# Patient Record
Sex: Male | Born: 1945
Health system: Southern US, Community
[De-identification: ages and names within clinical notes are randomized; demographics above are authoritative.]

## PROBLEM LIST (undated history)

## (undated) ENCOUNTER — Emergency Department (HOSPITAL_COMMUNITY): Discharge status: Against Medical Advice | Payer: Self-pay

## (undated) DIAGNOSIS — I4891 Unspecified atrial fibrillation: Secondary | ICD-10-CM

## (undated) DIAGNOSIS — IMO0001 Reserved for inherently not codable concepts without codable children: Secondary | ICD-10-CM

## (undated) DIAGNOSIS — I4892 Unspecified atrial flutter: Secondary | ICD-10-CM

## (undated) DIAGNOSIS — I493 Ventricular premature depolarization: Secondary | ICD-10-CM

## (undated) DIAGNOSIS — Z8719 Personal history of other diseases of the digestive system: Secondary | ICD-10-CM

## (undated) DIAGNOSIS — R030 Elevated blood-pressure reading, without diagnosis of hypertension: Secondary | ICD-10-CM

## (undated) DIAGNOSIS — T7840XA Allergy, unspecified, initial encounter: Secondary | ICD-10-CM

## (undated) DIAGNOSIS — R42 Dizziness and giddiness: Secondary | ICD-10-CM

## (undated) HISTORY — DX: Ventricular premature depolarization: I49.3

## (undated) HISTORY — DX: Allergy, unspecified, initial encounter: T78.40XA

## (undated) HISTORY — PX: OTHER SURGICAL HISTORY: SHX169

## (undated) HISTORY — DX: Reserved for inherently not codable concepts without codable children: IMO0001

## (undated) HISTORY — DX: Unspecified atrial fibrillation: I48.91

## (undated) HISTORY — DX: Personal history of other diseases of the digestive system: Z87.19

## (undated) HISTORY — DX: Dizziness and giddiness: R42

## (undated) HISTORY — DX: Elevated blood-pressure reading, without diagnosis of hypertension: R03.0

## (undated) HISTORY — DX: Unspecified atrial flutter: I48.92

---

## 2008-06-19 DIAGNOSIS — Z8719 Personal history of other diseases of the digestive system: Secondary | ICD-10-CM

## 2008-06-19 HISTORY — DX: Personal history of other diseases of the digestive system: Z87.19

## 2009-04-28 ENCOUNTER — Inpatient Hospital Stay (HOSPITAL_COMMUNITY): Admission: EM | Admit: 2009-04-28 | Discharge: 2009-04-28 | Payer: Self-pay | Admitting: Emergency Medicine

## 2009-12-13 ENCOUNTER — Encounter: Admission: RE | Admit: 2009-12-13 | Discharge: 2009-12-13 | Payer: Self-pay | Admitting: Gastroenterology

## 2010-09-21 LAB — CBC
HCT: 42.2 % (ref 39.0–52.0)
Hemoglobin: 13.1 g/dL (ref 13.0–17.0)
Hemoglobin: 14.8 g/dL (ref 13.0–17.0)
MCHC: 35.3 g/dL (ref 30.0–36.0)
MCV: 93.1 fL (ref 78.0–100.0)
Platelets: 134 10*3/uL — ABNORMAL LOW (ref 150–400)
RBC: 3.95 MIL/uL — ABNORMAL LOW (ref 4.22–5.81)
WBC: 6.7 10*3/uL (ref 4.0–10.5)

## 2010-09-21 LAB — COMPREHENSIVE METABOLIC PANEL
ALT: 17 U/L (ref 0–53)
AST: 15 U/L (ref 0–37)
Albumin: 3.6 g/dL (ref 3.5–5.2)
CO2: 25 mEq/L (ref 19–32)
Calcium: 8.8 mg/dL (ref 8.4–10.5)
Potassium: 3.7 mEq/L (ref 3.5–5.1)
Total Protein: 6.9 g/dL (ref 6.0–8.3)

## 2010-09-21 LAB — URINALYSIS, ROUTINE W REFLEX MICROSCOPIC
Glucose, UA: NEGATIVE mg/dL
Leukocytes, UA: NEGATIVE
Nitrite: NEGATIVE
Protein, ur: 30 mg/dL — AB
Urobilinogen, UA: 0.2 mg/dL (ref 0.0–1.0)
pH: 5.5 (ref 5.0–8.0)

## 2010-09-21 LAB — URINE MICROSCOPIC-ADD ON

## 2010-09-21 LAB — DIFFERENTIAL
Basophils Relative: 0 % (ref 0–1)
Eosinophils Absolute: 0 10*3/uL (ref 0.0–0.7)
Eosinophils Relative: 0 % (ref 0–5)
Monocytes Absolute: 0.4 10*3/uL (ref 0.1–1.0)
Monocytes Relative: 5 % (ref 3–12)
Neutro Abs: 7.1 10*3/uL (ref 1.7–7.7)

## 2010-09-21 LAB — POCT I-STAT, CHEM 8
Calcium, Ion: 1.16 mmol/L (ref 1.12–1.32)
Sodium: 138 mEq/L (ref 135–145)

## 2010-09-21 LAB — BASIC METABOLIC PANEL
BUN: 5 mg/dL — ABNORMAL LOW (ref 6–23)
Calcium: 8.5 mg/dL (ref 8.4–10.5)
Chloride: 105 mEq/L (ref 96–112)
Creatinine, Ser: 1.28 mg/dL (ref 0.4–1.5)
Glucose, Bld: 108 mg/dL — ABNORMAL HIGH (ref 70–99)
Potassium: 3.7 mEq/L (ref 3.5–5.1)

## 2010-09-21 LAB — LIPASE, BLOOD: Lipase: 14 U/L (ref 11–59)

## 2012-10-29 ENCOUNTER — Encounter: Payer: Self-pay | Admitting: Family Medicine

## 2012-10-29 ENCOUNTER — Ambulatory Visit (INDEPENDENT_AMBULATORY_CARE_PROVIDER_SITE_OTHER): Payer: 59 | Admitting: Family Medicine

## 2012-10-29 VITALS — BP 138/80 | HR 74 | Temp 97.8°F | Resp 18 | Ht 70.0 in | Wt 166.0 lb

## 2012-10-29 DIAGNOSIS — Z0000 Encounter for general adult medical examination without abnormal findings: Secondary | ICD-10-CM

## 2012-10-29 DIAGNOSIS — T7840XA Allergy, unspecified, initial encounter: Secondary | ICD-10-CM | POA: Insufficient documentation

## 2012-10-29 MED ORDER — TRIAMCINOLONE ACETONIDE 0.025 % EX OINT: TOPICAL_OINTMENT | Freq: Two times a day (BID) | CUTANEOUS | Status: DC

## 2012-10-29 MED ORDER — FLUTICASONE PROPIONATE 50 MCG/ACT NA SUSP: 2.0000 | Freq: Every day | NASAL | Status: DC

## 2012-10-29 MED ORDER — LEVOCETIRIZINE DIHYDROCHLORIDE 5 MG PO TABS: 5.0000 mg | ORAL_TABLET | Freq: Every evening | ORAL | Status: DC

## 2012-10-29 NOTE — Progress Notes (Signed)
Subjective:    Patient ID: Gabriel Everett, male    DOB: Sep 04, 1945, 67 y.o.   MRN: [id number]  HPI  Patient is a 67 year old pleasant man here to establish care. He has no specific medical concerns. He would like a primary care physician. His past medical history is benign. He has mild seasonal allergies. He takes when necessary medication for that.  He was admitted in 2010 for right-sided colonic diverticulitis. He underwent a colonoscopy by Dr. Madilyn Fireman which was normal in 2011. He had a CT of abdomen and pelvis in 2011 that showed resolution of the diverticulitis.  Otherwise he seldom sees a doctor. He denies any significant past medical history or PSH.  Past Medical History  Diagnosis Date  . Allergy    Current outpatient prescriptions:triamcinolone (KENALOG) 0.025 % ointment, Apply topically 2 (two) times daily., Disp: 30 g, Rfl: 1;  fluticasone (FLONASE) 50 MCG/ACT nasal spray, Place 2 sprays into the nose daily., Disp: 16 g, Rfl: 6;  levocetirizine (XYZAL) 5 MG tablet, Take 1 tablet (5 mg total) by mouth every evening., Disp: 30 tablet, Rfl: 5  He has no known drug allergies History   Social History  . Marital Status: Married    Spouse Name: N/A    Number of Children: N/A  . Years of Education: N/A   Occupational History  . Not on file.   Social History Main Topics  . Smoking status: Never Smoker   . Smokeless tobacco: Never Used  . Alcohol Use: No  . Drug Use: No  . Sexually Active: Not on file     Comment: married   Other Topics Concern  . Not on file   Social History Narrative  . No narrative on file   Family History  Problem Relation Age of Onset  . Cancer Mother 38    esophageal     Review of Systems  All other systems reviewed and are negative.       Objective:   Physical Exam  Vitals reviewed. Constitutional: He is oriented to person, place, and time. He appears well-developed and well-nourished.  HENT:  Head: Normocephalic and atraumatic.   Right Ear: External ear normal.  Left Ear: External ear normal.  Nose: Nose normal.  Mouth/Throat: Oropharynx is clear and moist. No oropharyngeal exudate.  Eyes: Conjunctivae and EOM are normal. Pupils are equal, round, and reactive to light. Right eye exhibits no discharge. Left eye exhibits no discharge. No scleral icterus.  Neck: Normal range of motion. Neck supple. No JVD present. No tracheal deviation present. No thyromegaly present.  Cardiovascular: Normal rate, regular rhythm and normal heart sounds.  Exam reveals no gallop and no friction rub.   No murmur heard. Pulmonary/Chest: Effort normal and breath sounds normal. No respiratory distress. He has no wheezes. He has no rales. He exhibits no tenderness.  Abdominal: Soft. Bowel sounds are normal. He exhibits no distension and no mass. There is no tenderness. There is no rebound and no guarding.  Genitourinary: Rectum normal and prostate normal.  Musculoskeletal: Normal range of motion. He exhibits no edema and no tenderness.  Lymphadenopathy:    He has no cervical adenopathy.  Neurological: He is alert and oriented to person, place, and time. He has normal reflexes. He displays normal reflexes. No cranial nerve deficit. He exhibits normal muscle tone. Coordination normal.  Skin: Skin is warm and dry. No rash noted. No erythema. No pallor.  Psychiatric: He has a normal mood and affect. His behavior is normal. Judgment  and thought content normal.          Assessment & Plan:  1. Routine general medical examination at a health care facility Patient has a normal physical exam today. I refilled his when necessary allergy medicines. I recommended he get a Pneumovax, Zostavax, and Tdap.  He deferred the shots for now. I also recommended that he obtain fasting blood work including a CBC, CMP, TSH, PSA, and fasting lipid.  He states his health insurance is coming tomorrow to draw blood work and get EKG. He would rather get that lab work and  EKG and fax that to me for me to review. I feel that this is appropriate. There is no need to be redundant with lab work. I will review the labs he has done tomorrow and his EKG. Any labs that are deficient, I can certainly draw them.  His colonoscopy is up-to-date.

## 2012-11-19 ENCOUNTER — Encounter: Payer: Self-pay | Admitting: Family Medicine

## 2013-05-22 ENCOUNTER — Encounter: Payer: Self-pay | Admitting: Physician Assistant

## 2013-05-22 ENCOUNTER — Ambulatory Visit (INDEPENDENT_AMBULATORY_CARE_PROVIDER_SITE_OTHER): Payer: Medicare Other | Admitting: Physician Assistant

## 2013-05-22 VITALS — BP 162/90 | HR 135 | Ht 70.0 in | Wt 176.0 lb

## 2013-05-22 DIAGNOSIS — I4891 Unspecified atrial fibrillation: Secondary | ICD-10-CM

## 2013-05-22 DIAGNOSIS — I4892 Unspecified atrial flutter: Secondary | ICD-10-CM | POA: Insufficient documentation

## 2013-05-22 MED ORDER — DILTIAZEM HCL ER COATED BEADS 120 MG PO CP24: 120.0000 mg | ORAL_CAPSULE | Freq: Every day | ORAL | Status: DC

## 2013-05-22 NOTE — Progress Notes (Signed)
Date:  05/22/2013   ID:  Gabriel Everett, DOB Oct 01, 1945, MRN [id number]  PCP:  Leo Grosser, MD  Primary Cardiologist:  [facility]     History of Present Illness: Gabriel Everett is a 67 y.o. male, who is very active, with a history of allergies and no prior cardiac history.  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  He has never used tobacco but does have 3-4 beers per day.  No family history of CAD/arrhythmia.  He had two [employer] physicals last May 2014 which were excellent.  His wife has colon cancer with what sounds like metastasis to the liver. He reports flu-like feeling this past Saturday.  He also has been having some dizziness with head movement and popping in his left ear.  He was seen by Dr. Wille Celeste today for the dizziness issue and EKG revealed a-flutter with rapid VR.  Here in the clinic he is in rapid A fib at 135bpm.  He is essentially asymptomatic and denies nausea, vomiting, fever, chest pain, shortness of breath, orthopnea, PND, cough, congestion, abdominal pain, hematochezia, melena, lower extremity edema, claudication.  Recent CMP, Lipid, UA, PSA, CEA were WNL.  Wt Readings from Last 3 Encounters:  05/22/13 176 lb (79.833 kg)  10/29/12 166 lb (75.297 kg)     Past Medical History  Diagnosis Date  . Allergy     Current Outpatient Prescriptions  Medication Sig Dispense Refill  . fluticasone (FLONASE) 50 MCG/ACT nasal spray Place 2 sprays into the nose daily.  16 g  6  . levocetirizine (XYZAL) 5 MG tablet Take 1 tablet (5 mg total) by mouth every evening.  30 tablet  5  . diltiazem (CARDIZEM CD) 120 MG 24 hr capsule Take 1 capsule (120 mg total) by mouth daily.  90 capsule  3   No current facility-administered medications for this visit.    Allergies:   No Known Allergies  Social History:  The patient  reports that he has never smoked. He has never used  smokeless tobacco. He reports that he drinks about 10.5 ounces of alcohol per week. He reports that he does not use illicit drugs.   Family history:   Family History  Problem Relation Age of Onset  . Cancer Mother 13    esophageal    ROS:  Please see the history of present illness.  All other systems reviewed and negative.   PHYSICAL EXAM: VS:  BP 162/90  Pulse 135  Ht 5\' 10"  (1.778 m)  Wt 176 lb (79.833 kg)  BMI 25.25 kg/m2 Well nourished, well developed, in no acute distress HEENT: Pupils are equal round react to light accommodation extraocular movements are intact.  Oral mucosa moist no exudate or lesions. Neck: no JVDNo cervical lymphadenopathy. Cardiac: irreg rate and rhythm. No MRG Lungs:  clear to auscultation bilaterally, no wheezing, rhonchi or rales Abd: soft, nontender, positive bowel sounds all quadrants, no hepatosplenomegaly Ext: no lower extremity edema.  2+ radial and dorsalis pedis pulses. Skin: warm and dry Neuro:  Grossly normal  EKG:  Afib 135bpm,  LAFB  ASSESSMENT AND PLAN:  Problem List Items Addressed This Visit   Atrial fibrillation with rapid ventricular response     New onset Afib/flutter.  The date of onset is unknown.  CHADSVASc of 1.  Start ASA 325mg .  I added 120mg  of Cardizem CD for rate  control.  Cardionet monitor for 2 weeks.  Will check 2D echo.  He may need ischemic WU.   May need TEE/ DCCV. In the future if it persists, but he is asymptomatic currently.  I did ask him to avoid caffeine and decrease ETOH intake.      Relevant Medications      diltiazem (CARDIZEM CD) 24 hr capsule    Other Visit Diagnoses   A-fib    -  Primary    Relevant Orders       EKG 12-Lead       2D Echocardiogram without contrast       Cardiac event monitor

## 2013-05-22 NOTE — Assessment & Plan Note (Addendum)
New onset Afib/flutter.  The date of onset is unknown.  CHADSVASc of 1.  Start ASA 325mg .  I added 120mg  of Cardizem CD for rate control.  Cardionet monitor for 2 weeks.  Will check 2D echo.  He may need ischemic WU.   May need TEE/ DCCV. In the future if it persists, but he is asymptomatic currently.  I did ask him to avoid caffeine and decrease ETOH intake.

## 2013-05-22 NOTE — Patient Instructions (Signed)
1.  Start taking a full dose ASA 325mg  daily. 2.  Cardionet monitor  3.  We will schedule a Echocardiogram. 4.  Follow up with Dr. Rennis Golden in two weeks.

## 2013-05-23 ENCOUNTER — Encounter: Payer: Self-pay | Admitting: Physician Assistant

## 2013-05-23 ENCOUNTER — Telehealth: Payer: Self-pay | Admitting: Physician Assistant

## 2013-05-23 NOTE — Telephone Encounter (Signed)
He wants to know how long he is suppose to wear his monitor?

## 2013-05-23 NOTE — Telephone Encounter (Signed)
Spoke to patient. Per office note 05/22/13 ,patient is aware to wear the monitor for 2 weeks. He verbalized understanding. It will be completed on  06/05/13.

## 2013-06-09 ENCOUNTER — Ambulatory Visit (HOSPITAL_COMMUNITY)
Admission: RE | Admit: 2013-06-09 | Discharge: 2013-06-09 | Disposition: A | Discharge status: Home / Self Care | Payer: Medicare Other | Source: Ambulatory Visit | Attending: Cardiology | Admitting: Cardiology

## 2013-06-09 ENCOUNTER — Other Ambulatory Visit (HOSPITAL_COMMUNITY): Payer: Self-pay | Admitting: Cardiovascular Disease

## 2013-06-09 DIAGNOSIS — R42 Dizziness and giddiness: Secondary | ICD-10-CM | POA: Insufficient documentation

## 2013-06-09 DIAGNOSIS — I4892 Unspecified atrial flutter: Secondary | ICD-10-CM

## 2013-06-09 NOTE — Progress Notes (Signed)
2D Echo Performed 06/09/2013    Matyas Baisley, RCS  

## 2013-06-16 ENCOUNTER — Encounter: Payer: Self-pay | Admitting: *Deleted

## 2013-06-18 ENCOUNTER — Encounter: Payer: Self-pay | Admitting: Internal Medicine

## 2013-06-18 ENCOUNTER — Ambulatory Visit (INDEPENDENT_AMBULATORY_CARE_PROVIDER_SITE_OTHER): Payer: Medicare Other | Admitting: Internal Medicine

## 2013-06-18 VITALS — BP 158/70 | HR 61 | Ht 70.0 in | Wt 174.7 lb

## 2013-06-18 DIAGNOSIS — I4892 Unspecified atrial flutter: Secondary | ICD-10-CM

## 2013-06-18 DIAGNOSIS — R42 Dizziness and giddiness: Secondary | ICD-10-CM

## 2013-06-18 DIAGNOSIS — I4891 Unspecified atrial fibrillation: Secondary | ICD-10-CM

## 2013-06-18 HISTORY — DX: Unspecified atrial flutter: I48.92

## 2013-06-18 HISTORY — DX: Dizziness and giddiness: R42

## 2013-06-18 NOTE — Progress Notes (Signed)
Date:  06/18/2013   ID:  Gabriel Everett, DOB July 01, 1945, MRN [id number]  PCP:  Leo Grosser, MD  Primary Cardiologist:  [facility]     History of Present Illness: Gabriel Everett is a 67 y.o. male, who is very active, with a history of allergies and no prior cardiac history.  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  He has never used tobacco but does have 3-4 beers per day.  No family history of CAD/arrhythmia.  He had two [employer] physicals last May 2014 which were excellent.  His wife has colon cancer with what sounds like metastasis to the liver. He reports flu-like feeling this past Saturday.  He also has been having some dizziness with head movement and popping in his left ear.  He was seen by Dr. Wille Celeste today for the dizziness issue and EKG revealed a-flutter with rapid VR.  In the office, he was noted to be in rapid A fib at 135bpm.  He is essentially asymptomatic and denies nausea, vomiting, fever, chest pain, shortness of breath, orthopnea, PND, cough, congestion, abdominal pain, hematochezia, melena, lower extremity edema, claudication.  He was placed on diltiazem 120 mg daily by Wilburt Finlay, PA-C, and started on full dose aspirin.  He underwent a monitor which she wore between 05/22/2013 and 06/04/2013. This showed typical counterclockwise atrial flutter which persisted until 05/26/2013. He was then noted to to have some PVCs and ultimately converted to sinus rhythm with first degree AV block. He subsequently had bigeminal PVCs on 05/29/2013 but is maintaining sinus rhythm by EKG today.  He is reportedly asymptomatic throughout this whole event.  He recently underwent an echocardiogram which was essentially normal, with no significant valvular abnormalities and normal left and right atrial chamber sizes.  Wt Readings from Last 3 Encounters:  06/18/13 174 lb 11.2 oz (79.243 kg)    05/22/13 176 lb (79.833 kg)  10/29/12 166 lb (75.297 kg)     Past Medical History  Diagnosis Date  . Allergy     Current Outpatient Prescriptions  Medication Sig Dispense Refill  . aspirin 325 MG tablet Take 325 mg by mouth daily.      Marland Kitchen diltiazem (CARDIZEM CD) 120 MG 24 hr capsule Take 1 capsule (120 mg total) by mouth daily.  90 capsule  3  . fluticasone (FLONASE) 50 MCG/ACT nasal spray Place 2 sprays into the nose daily as needed.      Marland Kitchen levocetirizine (XYZAL) 5 MG tablet Take 1 tablet (5 mg total) by mouth every evening.  30 tablet  5   No current facility-administered medications for this visit.    Allergies:   No Known Allergies  Social History:  The patient  reports that he has never smoked. He has never used smokeless tobacco. He reports that he drinks about 10.5 ounces of alcohol per week. He reports that he does not use illicit drugs.   Family history:   Family History  Problem Relation Age of Onset  . Cancer Mother 5    esophageal    ROS:  No significant symptoms at this time. All other systems reviewed and negative.   PHYSICAL EXAM: VS:  BP 158/70  Pulse 61  Ht 5\' 10"  (1.778 m)  Wt 174 lb 11.2 oz (79.243 kg)  BMI 25.07 kg/m2 deferred  EKG:  Normal sinus rhythm at 61, nonspecific IVCD  ASSESSMENT AND PLAN:  Problem List Items Addressed This Visit   Atrial fibrillation with rapid ventricular response - Primary   Relevant Medications      aspirin 325 MG tablet   Other Relevant Orders      EKG 12-Lead      Exercise Tolerance Test      Ambulatory referral to Cardiac Electrophysiology    Other Visit Diagnoses   Atrial flutter        Relevant Orders       Ambulatory referral to Cardiac Electrophysiology      PLAN: 1.  [name] had the onset of typical atrial flutter which appears isthmus-dependent. He was placed on Cardizem and aspirin and spontaneously converted while wearing a monitor. He is currently maintaining sinus rhythm. Is not clear to  me what triggered this event, but may be related to his upper respiratory infection. His echocardiogram demonstrates essentially normal cardiac structure and function. He has few risk factors for ischemia, however I would like to rule out any significant coronary ischemia and evaluate as to whether his symptoms could be exercise-induced.  I would recommend an exercise treadmill stress test.  If this is negative, I will refer him to see Dr. Johney Frame with cardiac electrophysiology for his advice as to whether or not [name] would benefit from flutter ablation.  Chrystie Nose, MD, [facility] Attending Cardiologist [facility]

## 2013-06-18 NOTE — Patient Instructions (Signed)
Your physician has requested that you have an exercise tolerance test. For further information please visit [email or web address] Please also follow instruction sheet, as given.  A referral has been made for a consultation with Dr. Hillis Range for an electrophysiology consult.  Your physician recommends that you schedule a follow-up appointment in: 6 months

## 2013-06-25 ENCOUNTER — Ambulatory Visit (HOSPITAL_COMMUNITY)
Admission: RE | Admit: 2013-06-25 | Discharge: 2013-06-25 | Disposition: A | Discharge status: Home / Self Care | Payer: Medicare HMO | Source: Ambulatory Visit | Attending: Internal Medicine | Admitting: Internal Medicine

## 2013-06-25 DIAGNOSIS — I4891 Unspecified atrial fibrillation: Secondary | ICD-10-CM

## 2013-07-02 ENCOUNTER — Telehealth: Payer: Self-pay | Admitting: Internal Medicine

## 2013-07-02 NOTE — Telephone Encounter (Signed)
Returning your call. °

## 2013-07-03 NOTE — Telephone Encounter (Signed)
Called patient with low risk treadmill stress test results.

## 2013-07-14 ENCOUNTER — Encounter: Payer: Self-pay | Admitting: Internal Medicine

## 2013-07-14 ENCOUNTER — Ambulatory Visit (INDEPENDENT_AMBULATORY_CARE_PROVIDER_SITE_OTHER): Payer: Medicare HMO | Admitting: Internal Medicine

## 2013-07-14 VITALS — BP 160/92 | HR 62 | Ht 70.0 in | Wt 175.0 lb

## 2013-07-14 DIAGNOSIS — I4891 Unspecified atrial fibrillation: Secondary | ICD-10-CM

## 2013-07-14 DIAGNOSIS — I10 Essential (primary) hypertension: Secondary | ICD-10-CM

## 2013-07-14 LAB — BASIC METABOLIC PANEL
BUN: 10 mg/dL (ref 6–23)
CHLORIDE: 107 meq/L (ref 96–112)
CO2: 26 mEq/L (ref 19–32)
CREATININE: 1.1 mg/dL (ref 0.4–1.5)
Calcium: 8.9 mg/dL (ref 8.4–10.5)
GFR: 73.47 mL/min (ref >60.00)
GLUCOSE: 85 mg/dL (ref 70–99)
POTASSIUM: 4 meq/L (ref 3.5–5.1)
Sodium: 139 mEq/L (ref 135–145)

## 2013-07-14 MED ORDER — LISINOPRIL 5 MG PO TABS: 5.0000 mg | ORAL_TABLET | Freq: Every day | ORAL | Status: DC

## 2013-07-14 NOTE — Patient Instructions (Signed)
Your physician recommends that you schedule a follow-up appointment as needed  Your physician has recommended you make the following change in your medication:  1) Start Lisinopril 5 mg daily  Your physician recommends that you return for lab work today and in 6 weeks BMP at your PCP office with a BP check

## 2013-07-14 NOTE — Progress Notes (Signed)
Primary Care Physician: Leo Grosser, MD Referring Physician:  Dr Verl Blalock is a 68 y.o. male with a h/o recently diagnosed atrial flutter who presents today for EP consultation.  He reports being in good health until December when he developed dizziness related to "inner ear infection".  He was evaluated at urgent care and was found to have atrial flutter.  He was referred to Dr Rennis Golden and was confirmed to have atrial flutter.  He wore an event monitor during which he converted to sinus rhythm.  He underwent echo and GXT.  He was placed on ASA and diltiazem for rate control.  He reports that he had less energy on diltiazem and therefore stopped this medicine.  He was mostly asymptomatic with atrial flutter but did have occasional "nervousness" associated with atrial flutter.  Presently, he is doing well.  His exercise tolerance is preserved.  Today, he denies symptoms of palpitations, chest pain, shortness of breath, orthopnea, PND, lower extremity edema,  presyncope, syncope, or neurologic sequela. The patient is tolerating medications without difficulties and is otherwise without complaint today.   Past Medical History  Diagnosis Date  . Allergy     Seasonal  . Dizziness 06/18/13    due to "inner ear infection"  . Atrial flutter with rapid ventricular response 06/18/13  . PVC's (premature ventricular contractions)     By monitor 05/22/13  . History of colonic diverticulitis 2010    Right-sided.  . Elevated blood pressure     elevated in the office 07/14/13 though he does not carry a diagnosis of HTN   Past Surgical History  Procedure Laterality Date  . None      Current Outpatient Prescriptions  Medication Sig Dispense Refill  . aspirin 325 MG tablet Take 325 mg by mouth as needed.        No current facility-administered medications for this visit.    No Known Allergies  History   Social History  . Marital Status: Married    Spouse Name: N/A    Number  of Children: N/A  . Years of Education: N/A   Occupational History  . Not on file.   Social History Main Topics  . Smoking status: Never Smoker   . Smokeless tobacco: Never Used  . Alcohol Use: 12.6 - 16.8 oz/week    21-28 Cans of beer per week     Comment: 4-5 beers per day  . Drug Use: No  . Sexual Activity: Not on file     Comment: married   Other Topics Concern  . Not on file   Social History Narrative   Lives in [place] with wife.  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    Family History  Problem Relation Age of Onset  . Cancer Mother 55    esophageal    ROS- All systems are reviewed and negative except as per the HPI above  Physical Exam: Filed Vitals:   07/14/13 1114  BP: 160/92  Pulse: 62  Height: 5\' 10"  (1.778 m)  Weight: 175 lb (79.379 kg)    GEN- The patient is well appearing, alert and oriented x 3 today.   Head- normocephalic, atraumatic Eyes-  Sclera clear, conjunctiva pink Ears- hearing intact Oropharynx- clear Neck- supple, no JVP Lymph- no cervical lymphadenopathy Lungs- Clear to ausculation bilaterally, normal work of breathing Heart- Regular rate and rhythm, no murmurs, rubs or gallops, PMI not laterally displaced GI- soft, NT, ND, + BS  Extremities- no clubbing, cyanosis, or edema MS- no significant deformity or atrophy Skin- no rash or lesion Psych- euthymic mood, full affect Neuro- strength and sensation are intact  EKG today reveals sinus rhythm 62 bpm, PR 186 msec, LAD, otherwise normal ekg Event monitor is reviewed which reveals sinus with atrial flutter.  Average HR is 71 bpm (range 50-160)  Assessment and Plan:  1. Atrial flutter The patient presents for EP consultation.  He has had typical appearing atrial flutter.  His CHADS2VASC score is 2 (age, htn).  He declines anticoagulation.  I certainly think that he is a good candidate for ablation. Therapeutic strategies for atrial flutter including medicine and  ablation were discussed in detail with the patient today. Risk, benefits, and alternatives to EP study and radiofrequency ablation were also discussed in detail today. At this time, he is clear that he does not wish to consider ablation.  He will contact my office if he changes his mind.  2. HTN New diagnosis Initiation lisinopril 5mg  daily followup with primary care for bmet and further BP evaluation in 6 weeks  Follow-up with Dr Rennis GoldenHilty as planned I will be happy to see as needed should he decide to consider ablation going forward.

## 2013-07-19 DIAGNOSIS — I10 Essential (primary) hypertension: Secondary | ICD-10-CM | POA: Insufficient documentation

## 2016-05-20 DIAGNOSIS — I6522 Occlusion and stenosis of left carotid artery: Secondary | ICD-10-CM | POA: Diagnosis not present

## 2016-05-20 DIAGNOSIS — I10 Essential (primary) hypertension: Secondary | ICD-10-CM | POA: Diagnosis not present

## 2016-05-20 DIAGNOSIS — Z0000 Encounter for general adult medical examination without abnormal findings: Secondary | ICD-10-CM | POA: Diagnosis not present

## 2017-04-28 ENCOUNTER — Other Ambulatory Visit: Payer: Self-pay

## 2017-04-28 ENCOUNTER — Encounter (HOSPITAL_COMMUNITY): Payer: Self-pay | Admitting: Emergency Medicine

## 2017-04-28 ENCOUNTER — Emergency Department (HOSPITAL_COMMUNITY): Payer: Medicare HMO

## 2017-04-28 ENCOUNTER — Emergency Department (HOSPITAL_COMMUNITY)
Admission: EM | Admit: 2017-04-28 | Discharge: 2017-04-29 | Disposition: A | Discharge status: Home / Self Care | Payer: Medicare HMO | Attending: Emergency Medicine | Admitting: Emergency Medicine

## 2017-04-28 DIAGNOSIS — I483 Typical atrial flutter: Secondary | ICD-10-CM | POA: Insufficient documentation

## 2017-04-28 DIAGNOSIS — R079 Chest pain, unspecified: Secondary | ICD-10-CM | POA: Diagnosis not present

## 2017-04-28 DIAGNOSIS — I4891 Unspecified atrial fibrillation: Secondary | ICD-10-CM | POA: Diagnosis not present

## 2017-04-28 DIAGNOSIS — R0789 Other chest pain: Secondary | ICD-10-CM | POA: Insufficient documentation

## 2017-04-28 DIAGNOSIS — I10 Essential (primary) hypertension: Secondary | ICD-10-CM | POA: Diagnosis not present

## 2017-04-28 DIAGNOSIS — R002 Palpitations: Secondary | ICD-10-CM | POA: Diagnosis present

## 2017-04-28 DIAGNOSIS — R42 Dizziness and giddiness: Secondary | ICD-10-CM | POA: Insufficient documentation

## 2017-04-28 LAB — CBC
HCT: 44 % (ref 39.0–52.0)
Hemoglobin: 15.6 g/dL (ref 13.0–17.0)
MCH: 32.3 pg (ref 26.0–34.0)
MCHC: 35.5 g/dL (ref 30.0–36.0)
MCV: 91.1 fL (ref 78.0–100.0)
PLATELETS: 215 10*3/uL (ref 150–400)
RBC: 4.83 MIL/uL (ref 4.22–5.81)
RDW: 12.8 % (ref 11.5–15.5)
WBC: 5.5 10*3/uL (ref 4.0–10.5)

## 2017-04-28 LAB — I-STAT TROPONIN, ED: TROPONIN I, POC: 0 ng/mL (ref 0.00–0.08)

## 2017-04-28 LAB — BASIC METABOLIC PANEL
Anion gap: 7 (ref 5–15)
BUN: 12 mg/dL (ref 6–20)
CALCIUM: 9.2 mg/dL (ref 8.9–10.3)
CO2: 23 mmol/L (ref 22–32)
CREATININE: 1.59 mg/dL — AB (ref 0.61–1.24)
Chloride: 108 mmol/L (ref 101–111)
GFR calc non Af Amer: 41 mL/min — ABNORMAL LOW (ref >60)
GFR, EST AFRICAN AMERICAN: 48 mL/min — AB (ref >60)
Glucose, Bld: 111 mg/dL — ABNORMAL HIGH (ref 65–99)
Potassium: 3.5 mmol/L (ref 3.5–5.1)
Sodium: 138 mmol/L (ref 135–145)

## 2017-04-28 NOTE — ED Notes (Signed)
Patient transported to X-ray 

## 2017-04-28 NOTE — ED Triage Notes (Signed)
Patient reports intermittent central chest " discomfort" with diaphoresis and lightheaded onset yesterday , denies chest pain at arrival . No SOB or nausea.

## 2017-04-28 NOTE — ED Notes (Signed)
Patient returned from X-ray 

## 2017-04-29 DIAGNOSIS — R42 Dizziness and giddiness: Secondary | ICD-10-CM | POA: Diagnosis not present

## 2017-04-29 DIAGNOSIS — I4891 Unspecified atrial fibrillation: Secondary | ICD-10-CM | POA: Insufficient documentation

## 2017-04-29 DIAGNOSIS — R0789 Other chest pain: Secondary | ICD-10-CM | POA: Diagnosis not present

## 2017-04-29 DIAGNOSIS — I10 Essential (primary) hypertension: Secondary | ICD-10-CM | POA: Diagnosis not present

## 2017-04-29 DIAGNOSIS — I483 Typical atrial flutter: Secondary | ICD-10-CM | POA: Diagnosis not present

## 2017-04-29 MED ORDER — DILTIAZEM HCL 25 MG/5ML IV SOLN
10.0000 mg | Freq: Once | INTRAVENOUS | Status: AC
Start: 2017-04-29 — End: 2017-04-29
  Administered 2017-04-29: 10 mg via INTRAVENOUS
  Filled 2017-04-29: qty 5

## 2017-04-29 MED ORDER — POTASSIUM CHLORIDE CRYS ER 20 MEQ PO TBCR
40.0000 meq | EXTENDED_RELEASE_TABLET | Freq: Once | ORAL | Status: AC
  Administered 2017-04-29: 40 meq via ORAL
  Filled 2017-04-29: qty 2

## 2017-04-29 MED ORDER — APIXABAN 5 MG PO TABS: 5.0000 mg | ORAL_TABLET | Freq: Two times a day (BID) | ORAL | 0 refills | Status: DC

## 2017-04-29 MED ORDER — DILTIAZEM HCL ER COATED BEADS 180 MG PO CP24: 180.0000 mg | ORAL_CAPSULE | Freq: Every day | ORAL | 0 refills | Status: DC

## 2017-04-29 NOTE — ED Provider Notes (Addendum)
[facility] Provider Note   CSN: [id number] Arrival date & time: 04/28/17  2229     History   Chief Complaint Chief Complaint  Patient presents with  . Chest Discomfort    Diaphoresis    HPI Gabriel Everett is a 71 y.o. male.  HPI   Patient is a 71 year old male with a history of atrial flutter presenting for palpitations and lightheadedness.  Patient reports that he began feeling a discomfort in his chest 3 days ago and reports that it felt consistent with his prior episodes of irregular heart rate.  Patient reports that he felt that it would resolve on its own, however he noted that last night he was diaphoretic and woke up this morning feeling lightheaded while he was in the shower.  No syncope or presyncope.  Patient reports that he takes his blood pressure at home and noted that it was approximately 101 systolic this morning.  Patient denies any chest pain, shortness of breath, lower extremity edema, fever, chills, nausea, or radiation of discomfort down his left arm or up his jaw.  Patient reports that he last saw a cardiologist in 2015 and reports that he was placed on what he believes was anticoagulation and antihypertensives, however he is not taking any currently.  Patient has no other chronic medical conditions and takes no other medications.  Past Medical History:  Diagnosis Date  . Allergy    Seasonal  . Atrial flutter with rapid ventricular response (HCC) 06/18/13  . Dizziness 06/18/13   due to "inner ear infection"  . Elevated blood pressure    elevated in the office 07/14/13 though he does not carry a diagnosis of HTN  . History of colonic diverticulitis 2010   Right-sided.  Marland Kitchen. PVC's (premature ventricular contractions)    By monitor 05/22/13    Patient Active Problem List   Diagnosis Date Noted  . Atrial fibrillation with RVR (HCC)   . Essential hypertension 07/19/2013  . Atrial flutter (HCC) 05/22/2013  . Allergy      Past Surgical History:  Procedure Laterality Date  . none         Home Medications    Prior to Admission medications   Not on File    Family History Family History  Problem Relation Age of Onset  . Cancer Mother 1282       esophageal    Social History Social History   Tobacco Use  . Smoking status: Never Smoker  . Smokeless tobacco: Never Used  Substance Use Topics  . Alcohol use: Yes    Alcohol/week: 12.6 - 16.8 oz    Types: 21 - 28 Cans of beer per week    Comment: 4-5 beers per day  . Drug use: No     Allergies   Patient has no known allergies.   Review of Systems Review of Systems  Constitutional: Negative for chills and fever.  Eyes: Negative for visual disturbance.  Respiratory: Negative for chest tightness and shortness of breath.   Cardiovascular: Positive for palpitations. Negative for chest pain and leg swelling.  Gastrointestinal: Negative for abdominal pain, nausea and vomiting.  Neurological: Positive for light-headedness. Negative for dizziness and syncope.  All other systems reviewed and are negative.    Physical Exam Updated Vital Signs BP (!) 114/57   Pulse 73   Temp 98.5 F (36.9 C) (Oral)   Resp 20   Ht 5\' 10"  (1.778 m)   Wt 72.6 kg (160 lb)  SpO2 97%   BMI 22.96 kg/m   Physical Exam  Constitutional: He appears well-developed and well-nourished. No distress.  HENT:  Head: Normocephalic and atraumatic.  Mouth/Throat: Oropharynx is clear and moist.  Eyes: Conjunctivae and EOM are normal. Pupils are equal, round, and reactive to light.  Neck: Normal range of motion. Neck supple.  Cardiovascular: S1 normal and S2 normal.  No murmur heard. Irregularly irregular rhythm noted.  Tachycardia noted.  Pulmonary/Chest: Effort normal and breath sounds normal. He has no wheezes. He has no rales.  Abdominal: Soft. He exhibits no distension. There is no tenderness. There is no guarding.  Musculoskeletal: Normal range of motion. He  exhibits no edema or deformity.  Neurological: He is alert.  Cranial nerves grossly intact. Patient was extremities symmetrically with good coordination.  Skin: Skin is warm and dry. No rash noted. No erythema.  Psychiatric: He has a normal mood and affect. His behavior is normal. Judgment and thought content normal.  Nursing note and vitals reviewed.    ED Treatments / Results  Labs (all labs ordered are listed, but only abnormal results are displayed) Labs Reviewed  BASIC METABOLIC PANEL - Abnormal; Notable for the following components:      Result Value   Glucose, Bld 111 (*)    Creatinine, Ser 1.59 (*)    GFR calc non Af Amer 41 (*)    GFR calc Af Amer 48 (*)    All other components within normal limits  CBC  I-STAT TROPONIN, ED    EKG  EKG Interpretation None       Radiology Dg Chest 2 View  Result Date: 04/28/2017 CLINICAL DATA:  Chest pain EXAM: CHEST  2 VIEW COMPARISON:  04/27/2009 FINDINGS: Hyperinflation. No focal consolidation or effusion. Mild scarring at the lingula. Normal heart size. Aortic atherosclerosis. No pneumothorax. IMPRESSION: Hyperinflation.  Negative for edema or infiltrate. Electronically Signed   By: Jasmine Pang M.D.   On: 04/28/2017 23:47    Procedures Procedures (including critical care time)  Medications Ordered in ED Medications  diltiazem (CARDIZEM) injection 10 mg (10 mg Intravenous Given 04/29/17 0154)  potassium chloride SA (K-DUR,KLOR-CON) CR tablet 40 mEq (40 mEq Oral Given 04/29/17 0215)     Initial Impression / Assessment and Plan / ED Course  I have reviewed the triage vital signs and the nursing notes.  Pertinent labs & imaging results that were available during my care of the patient were reviewed by me and considered in my medical decision making (see chart for details).     Final Clinical Impressions(s) / ED Diagnoses   Final diagnoses:  None    Patient is nontoxic-appearing, normotensive, and in no acute  distress.  Patient appears to be in atrial fibrillation with rapid ventricular response.  Patient is symptomatic but stable.  Will consult cardiology for further management.  Symptoms have been going on for greater than 48 hours and patient has not been on any anticoagulation.  On chart review, patient was previously given ASA and diltiazem.  Per discussion with Dr. Janne Napoleon, patient to receive 1 dose of IV push diltiazem in the emergency department and provided response, can be discharged with oral diltiazem and Eliquis.   CHA2DS2-VASc Score for Atrial Fibrillation    Patient Score  Age <65 = 0 65-74 = 1 > 75 = 2 1  Sex Male = 0 Male = 1 0  CHF History No = 0  Yes = 1 0  HTN History No = 0  Yes = 1 1  Stroke/TIA/TE History No = 0  Yes = 1 0  Vascular Disease History No = 0  Yes = 1 0  Diabetes History No = 0  Yes = 1 0  Total:  2   2.2 % stroke rate/year from a score of 2. Patient to be discharged on Eliquis per discussion with Dr. Cristal Deerhristopher in cardiology.  On final reevaluation of patient, rate was approximately 80 and patient was feeling well and asymptomatic.  Care signed out to Dr. Geoffery Lyonsouglas Delo.  ED Discharge Orders    None       Delia ChimesMurray, Alyssa B, PA-C 04/29/17 [id number]    Geoffery Lyonselo, Douglas, MD 04/29/17 0353    Elisha PonderMurray, Alyssa B, PA-C 05/03/17 2213    Geoffery Lyonselo, Douglas, MD 05/09/17 2258

## 2017-04-29 NOTE — ED Notes (Signed)
Cardiology consult at bedside.

## 2017-04-29 NOTE — Discharge Instructions (Addendum)
Begin taking Cardizem and apixaban as prescribed.  Follow up this week with your cardiologist for a recheck, and return to the emergency department if you develop chest pain, difficulty breathing, or other new and concerning symptoms.

## 2017-04-29 NOTE — Consult Note (Signed)
CARDIOLOGY CONSULT NOTE   Referring Physician: Dr. Judd Lienelo Primary Physician: N/A Primary Cardiologist: Dr. Johney FrameAllred Reason for Consultation: palpitations, tachycardia   HPI:  [name] is a generally healthy gentleman with a PMH of remote atrial flutter, possible HTN who is seen in consult at the request of Dr. Judd Lienelo and PA Dayton ScrapeMurray for evaluation of palpitations and tachycardia. The patient reports being under significant stress, recently losing his wife and son, as well as being stressed at work. For the past 3 days, he has felt fatigued and noted palpitations similar to the ones he had in 2015. He attempted to cope with them at home, and he tried to self-treat with a prior prescription of diltiazem, which provided some relief. However, the palpitations have persisted, and he presented to the ER today for further evaluation. He denies chest tightness, shortness of breath, PND, orthopnea, or syncope. He felt dizzy in the shower this morning but that self resolved. No lower extremity edema. No history of diabetes, TIA/PVD.   Patient reports last medical contact was with a sore foot in the past, for which he took (but is no longer taking) naproxen. Denies tobacco. Drinks several beers/day. On no routine medications, though he does have a sheet with a prior prescription for lisinopril 5 mg and diltiazem 120 mg daily. Is not taking aspirin. Denies sleep apnea.  Review of Systems:     Cardiac Review of Systems: {Y] = yes [ ]  = no  Chest Pain [  N  ]  Resting SOB [ N  ] Exertional SOB  [ N ]  Orthopnea [ N ]   Pedal Edema [  N ]    Palpitations [ Y ] Syncope  Klaus.Mock[N  ]   Presyncope Klaus.Mock[N   ]  General Review of Systems: [Y] = yes [  ]=no Constitional: recent weight change [  ]; anorexia [  ]; fatigue [ Y ]; nausea [  ]; night sweats [  ]; fever [  ]; or chills [  ];                                                                     Eyes : blurred vision [  ]; diplopia [   ]; vision changes [  ];  Amaurosis  fugax[  ]; Resp: cough [  ];  wheezing[  ];  hemoptysis[  ];  PND [  ];  GI:  gallstones[  ], vomiting[  ];  dysphagia[  ]; melena[  ];  hematochezia [  ]; heartburn[  ];   GU: kidney stones [  ]; hematuria[  ];   dysuria [  ];  nocturia[  ]; incontinence [  ];             Skin: rash, swelling[  ];, hair loss[  ];  peripheral edema[  ];  or itching[  ]; Musculosketetal: myalgias[  ];  joint swelling[  ];  joint erythema[  ];  joint pain[  ];  back pain[  ];  Heme/Lymph: bruising[  ];  bleeding[  ];  anemia[  ];  Neuro: TIA[  ];  headaches[  ];  stroke[  ];  vertigo[  ];  seizures[  ];   paresthesias[  ];  difficulty walking[  ];  Psych:depression[  ]; anxiety[  ];  Endocrine: diabetes[  ];  thyroid dysfunction[  ];  Other:  Past Medical History:  Diagnosis Date  . Allergy    Seasonal  . Atrial flutter with rapid ventricular response (HCC) 06/18/13  . Dizziness 06/18/13   due to "inner ear infection"  . Elevated blood pressure    elevated in the office 07/14/13 though he does not carry a diagnosis of HTN  . History of colonic diverticulitis 2010   Right-sided.  Marland Kitchen PVC's (premature ventricular contractions)    By monitor 05/22/13    (Not in a hospital admission)  Infusions:  No Known Allergies  Social History   Socioeconomic History  . Marital status: Married    Spouse name: Not on file  . Number of children: Not on file  . Years of education: Not on file  . Highest education level: Not on file  Social Needs  . Financial resource strain: Not on file  . Food insecurity - worry: Not on file  . Food insecurity - inability: Not on file  . Transportation needs - medical: Not on file  . Transportation needs - non-medical: Not on file  Occupational History  . Not on file  Tobacco Use  . Smoking status: Never Smoker  . Smokeless tobacco: Never Used  Substance and Sexual Activity  . Alcohol use: Yes    Alcohol/week: 12.6 - 16.8 oz    Types: 21 - 28 Cans of beer per week     Comment: 4-5 beers per day  . Drug use: No  . Sexual activity: Not on file    Comment: married  Other Topics Concern  . Not on file  Social History Narrative   Lives in [place] with wife.  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    Family History  Problem Relation Age of Onset  . Cancer Mother 70       esophageal    PHYSICAL EXAM: Vitals:   04/28/17 2325 04/29/17 0000  BP:  115/70  Pulse: (!) 120 63  Resp: 16 15  Temp:    SpO2: 99% 99%    No intake or output data in the 24 hours ending 04/29/17 0100  General:  Well appearing. No respiratory difficulty HEENT: normal Neck: supple. no JVD. Carotids 2+ bilat; no bruits. No lymphadenopathy or thryomegaly appreciated. Cor: PMI nondisplaced. Irregular tachycardic rhythm. No rubs, gallops or murmurs. Lungs: clear Abdomen: soft, nontender, nondistended. No hepatosplenomegaly. No bruits or masses. Good bowel sounds. Extremities: no cyanosis, clubbing, rash, edema Neuro: alert & oriented x 3, cranial nerves grossly intact. moves all 4 extremities w/o difficulty. Affect pleasant.  ECG: 12 lead with difficult to interpret baseline, irregularly irregular rhythm, no upright P waves in II, c/w coarse afib. Bedside monitor appears c/w afib.  Results for orders placed or performed during the hospital encounter of 04/28/17 (from the past 24 hour(s))  Basic metabolic panel     Status: Abnormal   Collection Time: 04/28/17 10:52 PM  Result Value Ref Range   Sodium 138 135 - 145 mmol/L   Potassium 3.5 3.5 - 5.1 mmol/L   Chloride 108 101 - 111 mmol/L   CO2 23 22 - 32 mmol/L   Glucose, Bld 111 (H) 65 - 99 mg/dL   BUN 12 6 - 20 mg/dL   Creatinine, Ser 2.95 (H) 0.61 - 1.24 mg/dL   Calcium 9.2 8.9 - 62.1 mg/dL   GFR  calc non Af Amer 41 (L) >60 mL/min   GFR calc Af Amer 48 (L) >60 mL/min   Anion gap 7 5 - 15  CBC     Status: None   Collection Time: 04/28/17 10:52 PM  Result Value Ref Range   WBC 5.5 4.0 - 10.5 K/uL    RBC 4.83 4.22 - 5.81 MIL/uL   Hemoglobin 15.6 13.0 - 17.0 g/dL   HCT 16.144.0 09.639.0 - 04.552.0 %   MCV 91.1 78.0 - 100.0 fL   MCH 32.3 26.0 - 34.0 pg   MCHC 35.5 30.0 - 36.0 g/dL   RDW 40.912.8 81.111.5 - 91.415.5 %   Platelets 215 150 - 400 K/uL  I-stat troponin, ED     Status: None   Collection Time: 04/28/17 11:02 PM  Result Value Ref Range   Troponin i, poc 0.00 0.00 - 0.08 ng/mL   Comment 3           Dg Chest 2 View  Result Date: 04/28/2017 CLINICAL DATA:  Chest pain EXAM: CHEST  2 VIEW COMPARISON:  04/27/2009 FINDINGS: Hyperinflation. No focal consolidation or effusion. Mild scarring at the lingula. Normal heart size. Aortic atherosclerosis. No pneumothorax. IMPRESSION: Hyperinflation.  Negative for edema or infiltrate. Electronically Signed   By: Jasmine PangKim  Fujinaga M.D.   On: 04/28/2017 23:47   ASSESSMENT/RECOMMENDATIONS: [name] is a generally healthy gentleman with a PMH of remote atrial flutter, possible HTN who is seen in consult at the request of Dr. Judd Lienelo and PA [place] ScrapeMurray for evaluation of palpitations and tachycardia. Poor 12 lead quality, but interpretable leads and bedside tele consistent w/afib. Hemodynamically stable, mildly symptomatic. May be exacerbated by recent severe life stresses and long term alcohol consumption. Patient prefers attempt at rate control and discharge to home.  -CHADSVASC=1 for age, though he has mildly elevated BP here and a prior prescription for lisinopril (which he is not taking), likely making him CV=2. With 3 day history, will start anticoagulation with apixaban 5 mg BID (given age/wt/Cr). -will trial rate control with dose of IV diltiazem. If he improves, would discharge him on diltiazem 180 mg daily long acting and have him follow up in afib clinic -if he is difficult to rate control as an outpatient, may require admission for TEE-CV -risk factors: stress, alcohol. Denies OSA though has never been tested. -would give him 40 mg potassium here given K of  3.5.  Jodelle RedBridgette Annalucia Laino, overnight cardiology provider

## 2017-05-01 ENCOUNTER — Ambulatory Visit (HOSPITAL_COMMUNITY)
Admission: RE | Admit: 2017-05-01 | Discharge: 2017-05-01 | Disposition: A | Discharge status: Home / Self Care | Payer: Medicare HMO | Source: Ambulatory Visit | Attending: Nurse Practitioner | Admitting: Nurse Practitioner

## 2017-05-01 ENCOUNTER — Encounter (HOSPITAL_COMMUNITY): Payer: Self-pay | Admitting: Nurse Practitioner

## 2017-05-01 VITALS — BP 136/72 | HR 94 | Ht 70.0 in | Wt 160.6 lb

## 2017-05-01 DIAGNOSIS — I483 Typical atrial flutter: Secondary | ICD-10-CM | POA: Diagnosis not present

## 2017-05-01 DIAGNOSIS — I4892 Unspecified atrial flutter: Secondary | ICD-10-CM | POA: Diagnosis not present

## 2017-05-01 DIAGNOSIS — Z7901 Long term (current) use of anticoagulants: Secondary | ICD-10-CM | POA: Diagnosis not present

## 2017-05-01 NOTE — Progress Notes (Signed)
Primary Care Physician: Patient, No Pcp [facility]   Liane Comberlfred L Ashbaugh is a 71 y.o. male with a h/o with a h/o typical atrial flutter that has been quiet for years, in the [facility] clinic for ER f/u. He was seen by Dr. Johney FrameAllred in 2016 and offered an ablattion but pt declined. This is the first episode that he has had since then. He was placed on eliquis 5 mg bid and BBand discharged in rate cointrolled atral flutter. He is still feeling a little light headed but better than when he presented to the ER. No know trigger for the episode.    No significant alcohol or tobacco use, no caffeine, no snoring history.   Today, he denies symptoms of chest pain, shortness of breath, orthopnea, PND, lower extremity edema, dizziness, presyncope, syncope, or neurologic sequela. The patient is tolerating medications without difficulties and is otherwise without complaint today.   Past Medical History:  Diagnosis Date  . Allergy    Seasonal  . Atrial flutter with rapid ventricular response (HCC) 06/18/13  . Dizziness 06/18/13   due to "inner ear infection"  . Elevated blood pressure    elevated in the office 07/14/13 though he does not carry a diagnosis of HTN  . History of colonic diverticulitis 2010   Right-sided.  Marland Kitchen. PVC's (premature ventricular contractions)    By monitor 05/22/13   Past Surgical History:  Procedure Laterality Date  . none      Current Outpatient Medications  Medication Sig Dispense Refill  . apixaban (ELIQUIS) 5 MG TABS tablet Take 1 tablet (5 mg total) 2 (two) times daily by mouth. 60 tablet 0  . diltiazem (CARDIZEM CD) 180 MG 24 hr capsule Take 1 capsule (180 mg total) daily by mouth. 30 capsule 0   No current facility-administered medications for this encounter.     No Known Allergies  Social History   Socioeconomic History  . Marital status: Married    Spouse name: Not on file  . Number of children: Not on file  . Years of education: Not on file    . Highest education level: Not on file  Social Needs  . Financial resource strain: Not on file  . Food insecurity - worry: Not on file  . Food insecurity - inability: Not on file  . Transportation needs - medical: Not on file  . Transportation needs - non-medical: Not on file  Occupational History  . Not on file  Tobacco Use  . Smoking status: Never Smoker  . Smokeless tobacco: Never Used  Substance and Sexual Activity  . Alcohol use: Yes    Alcohol/week: 12.6 - 16.8 oz    Types: 21 - 28 Cans of beer per week    Comment: 4-5 beers per day  . Drug use: No  . Sexual activity: Not on file    Comment: married  Other Topics Concern  . Not on file  Social History Narrative   [place].  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    Family History  Problem Relation Age of Onset  . Cancer Mother 1382       esophageal    ROS- All systems are reviewed and negative except as per the HPI above  Physical Exam: Vitals:   05/01/17 1509  BP: 136/72  Pulse: 94  Weight: 160 lb 9.6 oz (72.8 kg)  Height: 5\' 10"  (1.778 m)   Wt Readings from  Last 3 Encounters:  05/01/17 160 lb 9.6 oz (72.8 kg)  04/28/17 160 lb (72.6 kg)  07/14/13 175 lb (79.4 kg)    Labs: Lab Results  Component Value Date   NA 138 04/28/2017   K 3.5 04/28/2017   CL 108 04/28/2017   CO2 23 04/28/2017   GLUCOSE 111 (H) 04/28/2017   BUN 12 04/28/2017   CREATININE 1.59 (H) 04/28/2017   CALCIUM 9.2 04/28/2017   No results found for: INR No results found for: CHOL, HDL, LDLCALC, TRIG   GEN- The patient is well appearing, alert and oriented x 3 today.   Head- normocephalic, atraumatic Eyes-  Sclera clear, conjunctiva pink Ears- hearing intact Oropharynx- clear Neck- supple, no JVP Lymph- no cervical lymphadenopathy Lungs- Clear to ausculation bilaterally, normal work of breathing Heart- irregular rate and rhythm, no murmurs, rubs or gallops, PMI not laterally displaced GI-  soft, NT, ND, + BS Extremities- no clubbing, cyanosis, or edema MS- no significant deformity or atrophy Skin- no rash or lesion Psych- euthymic mood, full affect Neuro- strength and sensation are intact  EKG-typical atrial flutter at 94 bpm Echo- 2014-Study Conclusions  Left ventricle: The cavity size was normal. Systolic function was normal. The estimated ejection fraction was in the range of 55% to 60%. Wall motion was normal; there were no regional wall motion abnormalities. Left ventricular diastolic function parameters were normal. Transthoracic echocardiography. M-   Assessment and Plan 1. Atrial flutter Discussed with pt cardioversion after 3 weeks of anticoagulation vrs referral for ablation Pt is hoping he will convert on his own as he is not really excited about either option now Continue diltiazem 180 mg daily Continue eliquis 5 mg bid for a chadsvasc score of 1  I will see back in one week and see if still out of rhythm and if so further discuss options  Lupita LeashDonna C. Matthew Folksarroll, ANP-C [facility] [address] [place], [place] [address] [phone]

## 2017-05-08 ENCOUNTER — Ambulatory Visit (HOSPITAL_COMMUNITY)
Admission: RE | Admit: 2017-05-08 | Discharge: 2017-05-08 | Disposition: A | Discharge status: Home / Self Care | Payer: Medicare HMO | Source: Ambulatory Visit | Attending: Nurse Practitioner | Admitting: Nurse Practitioner

## 2017-05-08 ENCOUNTER — Encounter (HOSPITAL_COMMUNITY): Payer: Self-pay | Admitting: Nurse Practitioner

## 2017-05-08 VITALS — BP 158/68 | HR 104 | Ht 70.0 in | Wt 163.0 lb

## 2017-05-08 DIAGNOSIS — I4819 Other persistent atrial fibrillation: Secondary | ICD-10-CM

## 2017-05-08 DIAGNOSIS — I4891 Unspecified atrial fibrillation: Secondary | ICD-10-CM | POA: Insufficient documentation

## 2017-05-08 DIAGNOSIS — I481 Persistent atrial fibrillation: Secondary | ICD-10-CM | POA: Diagnosis not present

## 2017-05-08 DIAGNOSIS — I4892 Unspecified atrial flutter: Secondary | ICD-10-CM | POA: Diagnosis not present

## 2017-05-08 DIAGNOSIS — Z7901 Long term (current) use of anticoagulants: Secondary | ICD-10-CM | POA: Diagnosis not present

## 2017-05-08 MED ORDER — RIVAROXABAN 15 MG PO TABS: 15.0000 mg | ORAL_TABLET | Freq: Every day | ORAL | 3 refills | Status: DC

## 2017-05-08 MED ORDER — METOPROLOL TARTRATE 25 MG PO TABS: 12.5000 mg | ORAL_TABLET | Freq: Two times a day (BID) | ORAL | 3 refills | Status: DC

## 2017-05-08 MED ORDER — RIVAROXABAN 20 MG PO TABS: 20.0000 mg | ORAL_TABLET | Freq: Every day | ORAL | 3 refills | Status: DC

## 2017-05-08 NOTE — Patient Instructions (Signed)
STOP cardizem STOP eliquis  Start -- metoprolol (lopressor) 1/2 tablet twice a day Start -- Xarelto 15mg  once a day with meal

## 2017-05-09 NOTE — Progress Notes (Signed)
Primary Care Physician: Patient, No Pcp Per [facility] Phys[facility]   Liane Comberlfred L Beldin is a 71 y.o. male with a h/o with a h/o typical atrial flutter that has been quiet for years, in the [facility] clinic for ER f/u. He was seen by Dr. Johney FrameAllred in 2016 and offered an ablation but pt declined. This is the first episode that he has had since then. He was placed on eliquis 5 mg bid and BBand discharged in rate cointrolled atral flutter. He is still feeling a little light headed but better than when he presented to the ER. No know trigger for the episode.    No significant alcohol or tobacco use, no caffeine, no snoring history.  He returns to clinic 11/20. Today's EKG appears to be afib with v rate of 104 bpm.  He is c/o of being nervous, not sleeping well and being lightheaded. He thinks his symptoms are from the meds and not from the arrhythmia. He has been missing doses of eliquis or cutting pills in half, and skipping days of cardizem. He says his monitor at home is showing HR's in the 40's at times ? accurate.    Today, he denies symptoms of chest pain, shortness of breath, orthopnea, PND, lower extremity edema,  presyncope, syncope, or neurologic sequela.+ for lightheadedness, nervousness, insomnia. The patient is tolerating medications without difficulties and is otherwise without complaint today.   Past Medical History:  Diagnosis Date  . Allergy    Seasonal  . Atrial flutter with rapid ventricular response (HCC) 06/18/13  . Dizziness 06/18/13   due to "inner ear infection"  . Elevated blood pressure    elevated in the office 07/14/13 though he does not carry a diagnosis of HTN  . History of colonic diverticulitis 2010   Right-sided.  Marland Kitchen. PVC's (premature ventricular contractions)    By monitor 05/22/13   Past Surgical History:  Procedure Laterality Date  . none      Current Outpatient Medications  Medication Sig Dispense Refill  . metoprolol tartrate (LOPRESSOR) 25 MG tablet Take  0.5 tablets (12.5 mg total) by mouth 2 (two) times daily. 30 tablet 3  . Rivaroxaban (XARELTO) 15 MG TABS tablet Take 1 tablet (15 mg total) by mouth daily with supper. 30 tablet 3   No current facility-administered medications for this encounter.     No Known Allergies  Social History   Socioeconomic History  . Marital status: Married    Spouse name: Not on file  . Number of children: Not on file  . Years of education: Not on file  . Highest education level: Not on file  Social Needs  . Financial resource strain: Not on file  . Food insecurity - worry: Not on file  . Food insecurity - inability: Not on file  . Transportation needs - medical: Not on file  . Transportation needs - non-medical: Not on file  Occupational History  . Not on file  Tobacco Use  . Smoking status: Never Smoker  . Smokeless tobacco: Never Used  Substance and Sexual Activity  . Alcohol use: Yes    Alcohol/week: 12.6 - 16.8 oz    Types: 21 - 28 Cans of beer per week    Comment: 4-5 beers per day  . Drug use: No  . Sexual activity: Not on file    Comment: married  Other Topics Concern  . Not on file  Social History Narrative   [place] in [place].  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  Enjoys cooking.    Family History  Problem Relation Age of Onset  . Cancer Mother 9182       esophageal    ROS- All systems are reviewed and negative except as per the HPI above  Physical Exam: Vitals:   05/08/17 1434  BP: (!) 158/68  Pulse: (!) 104  Weight: 163 lb (73.9 kg)  Height: 5\' 10"  (1.778 m)   Wt Readings from Last 3 Encounters:  05/08/17 163 lb (73.9 kg)  05/01/17 160 lb 9.6 oz (72.8 kg)  04/28/17 160 lb (72.6 kg)    Labs: Lab Results  Component Value Date   NA 138 04/28/2017   K 3.5 04/28/2017   CL 108 04/28/2017   CO2 23 04/28/2017   GLUCOSE 111 (H) 04/28/2017   BUN 12 04/28/2017   CREATININE 1.59 (H) 04/28/2017   CALCIUM 9.2 04/28/2017   No results found for: INR No  results found for: CHOL, HDL, LDLCALC, TRIG   GEN- The patient is well appearing, alert and oriented x 3 today.   Head- normocephalic, atraumatic Eyes-  Sclera clear, conjunctiva pink Ears- hearing intact Oropharynx- clear Neck- supple, no JVP Lymph- no cervical lymphadenopathy Lungs- Clear to ausculation bilaterally, normal work of breathing Heart- irregular rate and rhythm, no murmurs, rubs or gallops, PMI not laterally displaced GI- soft, NT, ND, + BS Extremities- no clubbing, cyanosis, or edema MS- no significant deformity or atrophy Skin- no rash or lesion Psych- euthymic mood, full affect Neuro- strength and sensation are intact  EKG- today apperas to be afib at 104 bpm, qrs int 94 ms, qtc 460 ms Echo- 2014-Study Conclusions  Left ventricle: The cavity size was normal. Systolic function was normal. The estimated ejection fraction was in the range of 55% to 60%. Wall motion was normal; there were no regional wall motion abnormalities. Left ventricular diastolic function parameters were normal. Transthoracic echocardiography. M-   Assessment and Plan 1. Atrial flutter/fib I feel pt's symptoms are from the arrythmia, not the meds but pt has been lucky enough not to have to take meds and feels that the symptoms are from the meds. Will stop eliquis and start xarelto 15 mg based on crcl cal at 43.27 daily Will stop cardizem and start metoprolol tartrate 25 mg 1/2 tab bid Cardioversion discussed but reminded pt he has to take anticoagulation uninterrupted x 3 weeks    F/u in 1-2 weeks   Lupita LeashDonna C. Matthew Folksarroll, ANP-C [facility] [address] [place], [place] [address] [phone]

## 2017-05-14 ENCOUNTER — Telehealth (HOSPITAL_COMMUNITY): Payer: Self-pay | Admitting: *Deleted

## 2017-05-14 DIAGNOSIS — M109 Gout, unspecified: Secondary | ICD-10-CM | POA: Diagnosis not present

## 2017-05-14 NOTE — Telephone Encounter (Signed)
Pt cld to report swelling and redness on his left foot that he believes is gout but upon description he stated that it traveled up his leg and that its still very red.  Pt stated that he was unsure of what to take and asked Lupita LeashDonna to prescribe something for pain.  I advised pt that he needed to go [facility] since he does not have PCP.  Pt understood that this could be something other than gout and that he needed to actually be seen and since they do primary at [facility] as well they could get him established.

## 2017-05-17 ENCOUNTER — Encounter (HOSPITAL_COMMUNITY): Payer: Self-pay | Admitting: Nurse Practitioner

## 2017-05-17 ENCOUNTER — Ambulatory Visit (HOSPITAL_COMMUNITY)
Admission: RE | Admit: 2017-05-17 | Discharge: 2017-05-17 | Disposition: A | Discharge status: Home / Self Care | Payer: Medicare HMO | Source: Ambulatory Visit | Attending: Nurse Practitioner | Admitting: Nurse Practitioner

## 2017-05-17 VITALS — BP 140/68 | HR 76 | Ht 70.0 in | Wt 165.4 lb

## 2017-05-17 DIAGNOSIS — I483 Typical atrial flutter: Secondary | ICD-10-CM | POA: Diagnosis not present

## 2017-05-17 DIAGNOSIS — Z79899 Other long term (current) drug therapy: Secondary | ICD-10-CM | POA: Diagnosis not present

## 2017-05-17 DIAGNOSIS — Z7952 Long term (current) use of systemic steroids: Secondary | ICD-10-CM | POA: Diagnosis not present

## 2017-05-17 DIAGNOSIS — Z7901 Long term (current) use of anticoagulants: Secondary | ICD-10-CM | POA: Insufficient documentation

## 2017-05-17 DIAGNOSIS — Z800 Family history of malignant neoplasm of digestive organs: Secondary | ICD-10-CM | POA: Diagnosis not present

## 2017-05-17 MED ORDER — RIVAROXABAN 15 MG PO TABS: 15.0000 mg | ORAL_TABLET | Freq: Every day | ORAL | 0 refills | Status: DC

## 2017-05-17 NOTE — Progress Notes (Signed)
Primary Care Physician: Patient, No Pcp Per Referring Physician: [facility]   Gabriel Everett is a 71 y.o. male with a h/o with a h/o typical atrial flutter that has been quiet for years, in the [facility] clinic, 11/13, for ER f/u. He was seen by Dr. Johney FrameAllred in 2016 and offered an ablation but pt declined. This is the first episode that he has had since then. He was placed on eliquis 5 mg bid and BB and discharged in rate cointrolled atral flutter. He is still feeling a little light headed but better than when he presented to the ER. No know trigger for the episode.    No significant alcohol or tobacco use, no caffeine, no snoring history.  He returned to clinic 11/20.  EKG   v rate of 104 bpm.  He is c/o of being nervous, not sleeping well and being lightheaded. He thinks his symptoms are from the meds and not from the arrhythmia. He has been missing doses of eliquis or cutting pills in half, and skipping days of cardizem. He says his monitor at home is showing HR's in the 40's at times ? accurate.   F/u in afib clinic, 11/30.  I changed his meds from eliquis to 15 mg xarelto and metoprolol to Cardizem. He reports that he feels much better with change of meds, but EKG still shows a flutter with v rate at 76 bpm. He is wanting to have an ablation that was offered by Dr. Johney FrameAllred  In 2015 so he can get off meds.   Today, he denies symptoms of chest pain, shortness of breath, orthopnea, PND, lower extremity edema,  presyncope, syncope, or neurologic sequela.+ for lightheadedness, nervousness, insomnia. The patient is tolerating medications without difficulties and is otherwise without complaint today.   Past Medical History:  Diagnosis Date  . Allergy    Seasonal  . Atrial flutter with rapid ventricular response (HCC) 06/18/13  . Dizziness 06/18/13   due to "inner ear infection"  . Elevated blood pressure    elevated in the office 07/14/13 though he does not carry a diagnosis of HTN  . History of  colonic diverticulitis 2010   Right-sided.  Marland Kitchen. PVC's (premature ventricular contractions)    By monitor 05/22/13   Past Surgical History:  Procedure Laterality Date  . none      Current Outpatient Medications  Medication Sig Dispense Refill  . metoprolol tartrate (LOPRESSOR) 25 MG tablet Take 0.5 tablets (12.5 mg total) by mouth 2 (two) times daily. 30 tablet 3  . predniSONE (DELTASONE) 10 MG tablet as directed.  0  . Rivaroxaban (XARELTO) 15 MG TABS tablet Take 1 tablet (15 mg total) by mouth daily with supper. 30 tablet 0   No current facility-administered medications for this encounter.     No Known Allergies  Social History   Socioeconomic History  . Marital status: Married    Spouse name: Not on file  . Number of children: Not on file  . Years of education: Not on file  . Highest education level: Not on file  Social Needs  . Financial resource strain: Not on file  . Food insecurity - worry: Not on file  . Food insecurity - inability: Not on file  . Transportation needs - medical: Not on file  . Transportation needs - non-medical: Not on file  Occupational History  . Not on file  Tobacco Use  . Smoking status: Never Smoker  . Smokeless tobacco: Never Used  Substance  and Sexual Activity  . Alcohol use: Yes    Alcohol/week: 12.6 - 16.8 oz    Types: 21 - 28 Cans of beer per week    Comment: 4-5 beers per day  . Drug use: No  . Sexual activity: Not on file    Comment: married  Other Topics Concern  . Not on file  Social History Narrative   [place] in [place].  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    Family History  Problem Relation Age of Onset  . Cancer Mother 4382       esophageal    ROS- All systems are reviewed and negative except as per the HPI above  Physical Exam: Vitals:   05/17/17 1425  BP: 140/68  Pulse: 76  Weight: 165 lb 6.4 oz (75 kg)  Height: 5\' 10"  (1.778 m)   Wt Readings from Last 3 Encounters:  05/17/17  165 lb 6.4 oz (75 kg)  05/08/17 163 lb (73.9 kg)  05/01/17 160 lb 9.6 oz (72.8 kg)    Labs: Lab Results  Component Value Date   NA 138 04/28/2017   K 3.5 04/28/2017   CL 108 04/28/2017   CO2 23 04/28/2017   GLUCOSE 111 (H) 04/28/2017   BUN 12 04/28/2017   CREATININE 1.59 (H) 04/28/2017   CALCIUM 9.2 04/28/2017   No results found for: INR No results found for: CHOL, HDL, LDLCALC, TRIG   GEN- The patient is well appearing, alert and oriented x 3 today.   Head- normocephalic, atraumatic Eyes-  Sclera clear, conjunctiva pink Ears- hearing intact Oropharynx- clear Neck- supple, no JVP Lymph- no cervical lymphadenopathy Lungs- Clear to ausculation bilaterally, normal work of breathing Heart- irregular rate and rhythm, no murmurs, rubs or gallops, PMI not laterally displaced GI- soft, NT, ND, + BS Extremities- no clubbing, cyanosis, or edema MS- no significant deformity or atrophy Skin- no rash or lesion Psych- euthymic mood, full affect Neuro- strength and sensation are intact  EKG- appears to be typical flutter at 76 bpm Echo- 2014-Study Conclusions  Left ventricle: The cavity size was normal. Systolic function was normal. The estimated ejection fraction was in the range of 55% to 60%. Wall motion was normal; there were no regional wall motion abnormalities. Left ventricular diastolic function parameters were normal. Transthoracic echocardiography. M-   Assessment and Plan 1. Atrial flutter   Pt feels better on current meds Continue  xarelto 15 mg based on crcl cal at 43.27 daily Off cardizem and on metoprolol tartrate 25 mg 1/2 tab bid  I will refer back to Dr. Johney FrameAllred to review EKG's to confirm all are still typical atrial flutter and pt would still be an ablation candidate   Lupita LeashDonna C. Matthew Folksarroll, ANP-C [facility] Clinic [facility] [address] [place], [place] [address] [phone]

## 2017-06-18 ENCOUNTER — Ambulatory Visit: Payer: Medicare HMO | Admitting: Internal Medicine

## 2017-06-18 ENCOUNTER — Encounter: Payer: Self-pay | Admitting: Internal Medicine

## 2017-06-18 VITALS — BP 140/66 | HR 56 | Ht 70.0 in | Wt 163.0 lb

## 2017-06-18 DIAGNOSIS — I483 Typical atrial flutter: Secondary | ICD-10-CM

## 2017-06-18 NOTE — Progress Notes (Signed)
Electrophysiology Office Note   Date:  06/18/2017   ID:  Gabriel Everett, DOB 04/11/1946, MRN [id number]  PCP:  Patient, No Pcp Per    Primary Electrophysiologist: Hillis RangeJames Tylah Mancillas, MD    Chief Complaint  Patient presents with  . Advice Only    Aflutter     History of Present Illness: Gabriel Everett is a 71 y.o. male who presents today for electrophysiology evaluation.  He is referred by Rudi Cocoonna Carroll NP in the AF clinic for EP consultation regarding atrial flutter.  The patient has symptomatic typical atrial flutter.  He was seen by me 07/14/13 (note reviewed).  He declined ablation at that time and did well until recently.  Over the past few months, he has had symptomatic fatigue and dizziness.  He has been seen in the AF clinic and found to have atrial flutter.  He has not tolerated eliquis or metoprolol.  He has been reluctant to take his xarelto.  He has since converted to sinus rhythm. 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  Today, he denies symptoms of palpitations, chest pain, shortness of breath, orthopnea, PND, lower extremity edema, claudication, presyncope, syncope, bleeding, or neurologic sequela. The patient is tolerating medications without difficulties and is otherwise without complaint today.    Past Medical History:  Diagnosis Date  . Allergy    Seasonal  . Atrial flutter with rapid ventricular response (HCC) 06/18/13  . Dizziness 06/18/13   due to "inner ear infection"  . Elevated blood pressure    elevated in the office 07/14/13 though he does not carry a diagnosis of HTN  . History of colonic diverticulitis 2010   Right-sided.  Marland Kitchen. PVC's (premature ventricular contractions)    By monitor 05/22/13   Past Surgical History:  Procedure Laterality Date  . none       Current Outpatient Medications  Medication Sig Dispense Refill  . metoprolol tartrate (LOPRESSOR) 25 MG tablet Take 0.5 tablets (12.5 mg total) by mouth 2 (two)  times daily. 30 tablet 3  . predniSONE (DELTASONE) 10 MG tablet as directed.  0  . Rivaroxaban (XARELTO) 15 MG TABS tablet Take 1 tablet (15 mg total) by mouth daily with supper. 30 tablet 0   No current facility-administered medications for this visit.     Allergies:   Patient has no known allergies.   Social History:  The patient  reports that  has never smoked. he has never used smokeless tobacco. He reports that he drinks about 12.6 - 16.8 oz of alcohol per week. He reports that he does not use drugs.   Family History:  The patient's  family history includes Cancer (age of onset: 4682) in his mother.    ROS:  Please see the history of present illness.   All other systems are personally reviewed and negative.    PHYSICAL EXAM: VS:  BP 140/66   Pulse (!) 56   Ht 5\' 10"  (1.778 m)   Wt 163 lb (73.9 kg)   BMI 23.39 kg/m  , BMI Body mass index is 23.39 kg/m. GEN: Well nourished, well developed, in no acute distress  HEENT: normal  Neck: no JVD, carotid bruits, or masses Cardiac: RRR; no murmurs, rubs, or gallops,no edema  Respiratory:  clear to auscultation bilaterally, normal work of breathing GI: soft, nontender, nondistended, + BS MS: no deformity or atrophy  Skin: warm and dry  Neuro:  Strength and sensation are intact Psych:  euthymic mood, full affect  EKG:  EKG is ordered today. The ekg ordered today is personally reviewed and shows sinus rhythm 56 bpm, LAHB   Recent Labs: 04/28/2017: BUN 12; Creatinine, Ser 1.59; Hemoglobin 15.6; Platelets 215; Potassium 3.5; Sodium 138  personally reviewed   Lipid Panel  No results found for: CHOL, TRIG, HDL, CHOLHDL, VLDL, LDLCALC, LDLDIRECT personally reviewed   Wt Readings from Last 3 Encounters:  06/18/17 163 lb (73.9 kg)  05/17/17 165 lb 6.4 oz (75 kg)  05/08/17 163 lb (73.9 kg)     Other studies personally reviewed: Additional studies/ records that were reviewed today include: AF clinic  Review of the above records  today demonstrates: as above  Echo 06/09/2013 is also reviewed  ASSESSMENT AND PLAN:  1.  Typical atrial flutter Therapeutic strategies for atrial flutter including medicine and ablation were discussed in detail with the patient today. Risk, benefits, and alternatives to EP study and radiofrequency ablation were also discussed in detail today. These risks include but are not limited to stroke, bleeding, vascular damage, tamponade, perforation, damage to the heart and other structures, AV block requiring pacemaker, worsening renal function, and death. The patient understands these risk and wishes to proceed.  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  He will call our office once he is ready to proceed.  The importance of compliance with xarelto was discussed at length today.  Hopefully after ablation we can stop anticoagulation as he has not had afib documented   Current medicines are reviewed at length with the patient today.   The patient does not have concerns regarding his medicines.  The following changes were made today:  none    Signed, Hillis RangeJames Copeland Neisen, MD  06/18/2017 11:56 AM     [facility] [address] [place] [address] [phone] (office) [phone] (fax)

## 2017-06-18 NOTE — Patient Instructions (Addendum)
Medication Instructions:  Your physician recommends that you continue on your current medications as directed. Please refer to the Current Medication list given to you today.   Labwork: None ordered   Testing/Procedures: Your physician has recommended that you have an ablation. Catheter ablation is a medical procedure used to treat some cardiac arrhythmias (irregular heartbeats). During catheter ablation, a long, thin, flexible tube is put into a blood vessel in your groin (upper thigh), or neck. This tube is called an ablation catheter. It is then guided to your heart through the blood vessel. Radio frequency waves destroy small areas of heart tissue where abnormal heartbeats may cause an arrhythmia to start. Please see the instruction sheet given to you today.(CARTO and ANES)  Call when you decide if you want to proceed.  You will need to take Xarelto daily in order to proceed  Follow-Up: Your physician recommends that you schedule a follow-up appointment as needed unless you decide to proceed with ablation will schedule a follow-up 4 weeks after    Any Other Special Instructions Will Be Listed Below (If Applicable).     If you need a refill on your cardiac medications before your next appointment, please call your pharmacy.

## 2018-02-16 ENCOUNTER — Emergency Department (HOSPITAL_COMMUNITY): Payer: Medicare HMO

## 2018-02-16 ENCOUNTER — Encounter (HOSPITAL_COMMUNITY): Payer: Self-pay | Admitting: Emergency Medicine

## 2018-02-16 ENCOUNTER — Emergency Department (HOSPITAL_COMMUNITY)
Admission: EM | Admit: 2018-02-16 | Discharge: 2018-02-16 | Disposition: A | Discharge status: Home / Self Care | Payer: Medicare HMO | Attending: Emergency Medicine | Admitting: Emergency Medicine

## 2018-02-16 ENCOUNTER — Other Ambulatory Visit: Payer: Self-pay

## 2018-02-16 DIAGNOSIS — M545 Low back pain: Secondary | ICD-10-CM | POA: Diagnosis not present

## 2018-02-16 DIAGNOSIS — Z7901 Long term (current) use of anticoagulants: Secondary | ICD-10-CM | POA: Diagnosis not present

## 2018-02-16 DIAGNOSIS — T148XXA Other injury of unspecified body region, initial encounter: Secondary | ICD-10-CM | POA: Insufficient documentation

## 2018-02-16 DIAGNOSIS — Y998 Other external cause status: Secondary | ICD-10-CM | POA: Insufficient documentation

## 2018-02-16 DIAGNOSIS — S161XXA Strain of muscle, fascia and tendon at neck level, initial encounter: Secondary | ICD-10-CM | POA: Diagnosis not present

## 2018-02-16 DIAGNOSIS — M542 Cervicalgia: Secondary | ICD-10-CM | POA: Insufficient documentation

## 2018-02-16 DIAGNOSIS — S199XXA Unspecified injury of neck, initial encounter: Secondary | ICD-10-CM | POA: Diagnosis not present

## 2018-02-16 DIAGNOSIS — Y9389 Activity, other specified: Secondary | ICD-10-CM | POA: Insufficient documentation

## 2018-02-16 DIAGNOSIS — M549 Dorsalgia, unspecified: Secondary | ICD-10-CM | POA: Insufficient documentation

## 2018-02-16 DIAGNOSIS — Z79899 Other long term (current) drug therapy: Secondary | ICD-10-CM | POA: Insufficient documentation

## 2018-02-16 DIAGNOSIS — Y92410 Unspecified street and highway as the place of occurrence of the external cause: Secondary | ICD-10-CM | POA: Insufficient documentation

## 2018-02-16 DIAGNOSIS — I10 Essential (primary) hypertension: Secondary | ICD-10-CM | POA: Insufficient documentation

## 2018-02-16 DIAGNOSIS — R079 Chest pain, unspecified: Secondary | ICD-10-CM | POA: Diagnosis not present

## 2018-02-16 DIAGNOSIS — R0789 Other chest pain: Secondary | ICD-10-CM | POA: Diagnosis not present

## 2018-02-16 LAB — CBC
HCT: 42.5 % (ref 39.0–52.0)
HEMOGLOBIN: 14.3 g/dL (ref 13.0–17.0)
MCH: 32.1 pg (ref 26.0–34.0)
MCHC: 33.6 g/dL (ref 30.0–36.0)
MCV: 95.5 fL (ref 78.0–100.0)
Platelets: 180 10*3/uL (ref 150–400)
RBC: 4.45 MIL/uL (ref 4.22–5.81)
RDW: 12.2 % (ref 11.5–15.5)
WBC: 7.8 10*3/uL (ref 4.0–10.5)

## 2018-02-16 LAB — BASIC METABOLIC PANEL
ANION GAP: 7 (ref 5–15)
BUN: 7 mg/dL — ABNORMAL LOW (ref 8–23)
CALCIUM: 8.8 mg/dL — AB (ref 8.9–10.3)
CHLORIDE: 109 mmol/L (ref 98–111)
CO2: 23 mmol/L (ref 22–32)
Creatinine, Ser: 1.05 mg/dL (ref 0.61–1.24)
GFR calc non Af Amer: >60 mL/min (ref >60)
Glucose, Bld: 140 mg/dL — ABNORMAL HIGH (ref 70–99)
Potassium: 3.7 mmol/L (ref 3.5–5.1)
SODIUM: 139 mmol/L (ref 135–145)

## 2018-02-16 LAB — I-STAT TROPONIN, ED: TROPONIN I, POC: 0 ng/mL (ref 0.00–0.08)

## 2018-02-16 MED ORDER — IBUPROFEN 400 MG PO TABS: 400.0000 mg | ORAL_TABLET | Freq: Four times a day (QID) | ORAL | 0 refills | Status: DC | PRN

## 2018-02-16 MED ORDER — FENTANYL CITRATE (PF) 100 MCG/2ML IJ SOLN
100.0000 ug | Freq: Once | INTRAMUSCULAR | Status: AC
  Administered 2018-02-16: 100 ug via INTRAVENOUS
  Filled 2018-02-16: qty 2

## 2018-02-16 MED ORDER — METHOCARBAMOL 500 MG PO TABS: 500.0000 mg | ORAL_TABLET | Freq: Two times a day (BID) | ORAL | 0 refills | Status: DC
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
## 2018-02-16 NOTE — Discharge Instructions (Signed)
You can take Tylenol or Ibuprofen as directed for pain. You can alternate Tylenol and Ibuprofen every 4 hours. If you take Tylenol at 1pm, then you can take Ibuprofen at 5pm. Then you can take Tylenol again at 9pm.   Take Robaxin as prescribed. This medication will make you drowsy so do not drive or drink alcohol when taking it.  Follow-up with your primary care doctor as directed.  Use the incentive spirometer as instructed.   Return to the emergency department for any worsening chest pain, difficulty breathing, numbness/weakness of your arms or legs, difficulty walking or any other worsening or concerning symptoms.
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
## 2018-02-26 ENCOUNTER — Other Ambulatory Visit: Payer: Self-pay | Admitting: Orthopedic Surgery

## 2018-02-26 DIAGNOSIS — M25561 Pain in right knee: Secondary | ICD-10-CM | POA: Diagnosis not present

## 2018-02-26 DIAGNOSIS — R079 Chest pain, unspecified: Secondary | ICD-10-CM

## 2018-02-27 ENCOUNTER — Ambulatory Visit
Admission: RE | Admit: 2018-02-27 | Discharge: 2018-02-27 | Disposition: A | Discharge status: Home / Self Care | Payer: Medicare HMO | Source: Ambulatory Visit | Attending: Orthopedic Surgery | Admitting: Orthopedic Surgery

## 2018-02-27 DIAGNOSIS — R079 Chest pain, unspecified: Secondary | ICD-10-CM

## 2018-02-27 DIAGNOSIS — S299XXA Unspecified injury of thorax, initial encounter: Secondary | ICD-10-CM | POA: Diagnosis not present

## 2018-04-04 DIAGNOSIS — H524 Presbyopia: Secondary | ICD-10-CM | POA: Diagnosis not present

## 2018-04-04 DIAGNOSIS — H2513 Age-related nuclear cataract, bilateral: Secondary | ICD-10-CM | POA: Diagnosis not present

## 2018-04-04 DIAGNOSIS — H52221 Regular astigmatism, right eye: Secondary | ICD-10-CM | POA: Diagnosis not present

## 2018-04-04 DIAGNOSIS — H5203 Hypermetropia, bilateral: Secondary | ICD-10-CM | POA: Diagnosis not present
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
## 2021-06-17 DIAGNOSIS — H00014 Hordeolum externum left upper eyelid: Secondary | ICD-10-CM | POA: Diagnosis not present

## 2021-09-20 DIAGNOSIS — J069 Acute upper respiratory infection, unspecified: Secondary | ICD-10-CM | POA: Diagnosis not present

## 2021-09-20 DIAGNOSIS — J029 Acute pharyngitis, unspecified: Secondary | ICD-10-CM | POA: Diagnosis not present

## 2021-09-20 DIAGNOSIS — J0141 Acute recurrent pansinusitis: Secondary | ICD-10-CM | POA: Diagnosis not present

## 2022-01-09 DIAGNOSIS — M79643 Pain in unspecified hand: Secondary | ICD-10-CM | POA: Diagnosis not present

## 2022-01-09 DIAGNOSIS — R2231 Localized swelling, mass and lump, right upper limb: Secondary | ICD-10-CM | POA: Diagnosis not present

## 2022-03-03 DIAGNOSIS — G250 Essential tremor: Secondary | ICD-10-CM | POA: Diagnosis not present

## 2022-03-03 DIAGNOSIS — M62830 Muscle spasm of back: Secondary | ICD-10-CM | POA: Diagnosis not present

## 2022-03-03 DIAGNOSIS — M542 Cervicalgia: Secondary | ICD-10-CM | POA: Diagnosis not present

## 2022-03-17 DIAGNOSIS — M542 Cervicalgia: Secondary | ICD-10-CM | POA: Diagnosis not present

## 2022-03-17 DIAGNOSIS — M546 Pain in thoracic spine: Secondary | ICD-10-CM | POA: Diagnosis not present

## 2022-03-17 DIAGNOSIS — M62830 Muscle spasm of back: Secondary | ICD-10-CM | POA: Diagnosis not present

## 2022-06-16 DIAGNOSIS — Z7189 Other specified counseling: Secondary | ICD-10-CM | POA: Diagnosis not present

## 2022-06-16 DIAGNOSIS — M25561 Pain in right knee: Secondary | ICD-10-CM | POA: Diagnosis not present

## 2022-06-16 DIAGNOSIS — M25461 Effusion, right knee: Secondary | ICD-10-CM | POA: Diagnosis not present

## 2022-06-16 DIAGNOSIS — Z0000 Encounter for general adult medical examination without abnormal findings: Secondary | ICD-10-CM | POA: Diagnosis not present

## 2022-06-16 DIAGNOSIS — Z1389 Encounter for screening for other disorder: Secondary | ICD-10-CM | POA: Diagnosis not present

## 2022-06-16 DIAGNOSIS — J0141 Acute recurrent pansinusitis: Secondary | ICD-10-CM | POA: Diagnosis not present

## 2022-06-16 DIAGNOSIS — Z1331 Encounter for screening for depression: Secondary | ICD-10-CM | POA: Diagnosis not present

## 2022-07-24 DIAGNOSIS — J189 Pneumonia, unspecified organism: Secondary | ICD-10-CM | POA: Diagnosis not present

## 2022-07-24 DIAGNOSIS — J449 Chronic obstructive pulmonary disease, unspecified: Secondary | ICD-10-CM | POA: Diagnosis not present

## 2022-07-24 DIAGNOSIS — M25561 Pain in right knee: Secondary | ICD-10-CM | POA: Diagnosis not present

## 2022-07-24 DIAGNOSIS — R059 Cough, unspecified: Secondary | ICD-10-CM | POA: Diagnosis not present

## 2022-07-25 DIAGNOSIS — M1711 Unilateral primary osteoarthritis, right knee: Secondary | ICD-10-CM | POA: Diagnosis not present

## 2022-07-25 DIAGNOSIS — R918 Other nonspecific abnormal finding of lung field: Secondary | ICD-10-CM | POA: Diagnosis not present

## 2022-08-09 ENCOUNTER — Encounter: Payer: Self-pay | Admitting: Pulmonary Disease

## 2022-08-09 ENCOUNTER — Ambulatory Visit: Payer: Medicare HMO | Admitting: Pulmonary Disease

## 2022-08-09 VITALS — BP 130/84 | HR 71 | Temp 98.3°F | Ht 70.0 in | Wt 163.6 lb

## 2022-08-09 DIAGNOSIS — J309 Allergic rhinitis, unspecified: Secondary | ICD-10-CM

## 2022-08-09 DIAGNOSIS — R9389 Abnormal findings on diagnostic imaging of other specified body structures: Secondary | ICD-10-CM
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
## 2022-08-09 NOTE — Patient Instructions (Addendum)
Abnormal chest x-ray showing emphysema: High-resolution CT scan of the chest to evaluate to see if this is in fact true If you develop recurrent shortness of breath, chest tightness wheezing or cough with mucus production please let me know  Allergic rhinitis: Use Milta [facility] Med rinses with distilled water at least twice per day using the instructions on the package. 1/2 hour after using the [facility] rinse, use fluticasone nose spray two puffs in each nostril once per day.  Remember that the fluticasone can take 1-2 weeks to work after regular use. Use generic zyrtec (cetirizine) every day.  If this doesn't help, then stop taking it and use chlorpheniramine-phenylephrine combination tablets.  We will see you back if you develop any sort of respiratory symptoms. There was a

## 2022-08-18 ENCOUNTER — Telehealth: Payer: Self-pay | Admitting: Pulmonary Disease

## 2022-08-18 NOTE — Telephone Encounter (Signed)
Dr. Selinda Flavin calling asking that notes be sent to her about this PT's appt .[facility] referred Gabriel Everett   I added her as the PCP in his profile.   The fax # is [phone]

## 2022-08-18 NOTE — Telephone Encounter (Signed)
Printed off office note from Dr Lake Bells and patient and faxed to PCP Dr Selinda Flavin. Nothing further needed

## 2022-09-07 ENCOUNTER — Ambulatory Visit
Admission: RE | Admit: 2022-09-07 | Discharge: 2022-09-07 | Disposition: A | Discharge status: Home / Self Care | Payer: Medicare HMO | Source: Ambulatory Visit | Attending: Pulmonary Disease | Admitting: Pulmonary Disease

## 2022-09-07 DIAGNOSIS — I2510 Atherosclerotic heart disease of native coronary artery without angina pectoris: Secondary | ICD-10-CM | POA: Diagnosis not present

## 2022-09-07 DIAGNOSIS — I700 Atherosclerosis of aorta: Secondary | ICD-10-CM | POA: Diagnosis not present

## 2022-09-07 DIAGNOSIS — R9389 Abnormal findings on diagnostic imaging of other specified body structures: Secondary | ICD-10-CM

## 2022-09-07 DIAGNOSIS — J984 Other disorders of lung: Secondary | ICD-10-CM | POA: Diagnosis not present

## 2022-09-07 DIAGNOSIS — R0989 Other specified symptoms and signs involving the circulatory and respiratory systems: Secondary | ICD-10-CM | POA: Diagnosis not present

## 2022-12-11 DIAGNOSIS — R21 Rash and other nonspecific skin eruption: Secondary | ICD-10-CM | POA: Diagnosis not present

## 2022-12-11 DIAGNOSIS — L298 Other pruritus: Secondary | ICD-10-CM | POA: Diagnosis not present

## 2022-12-11 DIAGNOSIS — W57XXXA Bitten or stung by nonvenomous insect and other nonvenomous arthropods, initial encounter: Secondary | ICD-10-CM | POA: Diagnosis not present

## 2023-01-24 DIAGNOSIS — R5381 Other malaise: Secondary | ICD-10-CM | POA: Diagnosis not present

## 2023-01-24 DIAGNOSIS — W57XXXS Bitten or stung by nonvenomous insect and other nonvenomous arthropods, sequela: Secondary | ICD-10-CM | POA: Diagnosis not present

## 2023-01-24 DIAGNOSIS — R7301 Impaired fasting glucose: Secondary | ICD-10-CM | POA: Diagnosis not present

## 2023-01-24 DIAGNOSIS — R5383 Other fatigue: Secondary | ICD-10-CM | POA: Diagnosis not present

## 2023-01-24 DIAGNOSIS — E7841 Elevated Lipoprotein(a): Secondary | ICD-10-CM | POA: Diagnosis not present

## 2023-01-24 DIAGNOSIS — G250 Essential tremor: Secondary | ICD-10-CM | POA: Diagnosis not present

## 2023-01-24 DIAGNOSIS — Z131 Encounter for screening for diabetes mellitus: Secondary | ICD-10-CM | POA: Diagnosis not present

## 2023-01-24 DIAGNOSIS — Z136 Encounter for screening for cardiovascular disorders: Secondary | ICD-10-CM | POA: Diagnosis not present

## 2023-07-26 DIAGNOSIS — Z0100 Encounter for examination of eyes and vision without abnormal findings: Secondary | ICD-10-CM | POA: Diagnosis not present

## 2023-07-26 DIAGNOSIS — H2513 Age-related nuclear cataract, bilateral: Secondary | ICD-10-CM | POA: Diagnosis not present

## 2023-07-26 DIAGNOSIS — H52221 Regular astigmatism, right eye: Secondary | ICD-10-CM | POA: Diagnosis not present

## 2023-07-26 DIAGNOSIS — H524 Presbyopia: Secondary | ICD-10-CM | POA: Diagnosis not present

## 2023-07-26 DIAGNOSIS — H5203 Hypermetropia, bilateral: Secondary | ICD-10-CM | POA: Diagnosis not present

## 2023-08-23 DIAGNOSIS — R0981 Nasal congestion: Secondary | ICD-10-CM | POA: Diagnosis not present

## 2023-08-23 DIAGNOSIS — J0141 Acute recurrent pansinusitis: Secondary | ICD-10-CM | POA: Diagnosis not present

## 2023-10-24 DIAGNOSIS — J0141 Acute recurrent pansinusitis: Secondary | ICD-10-CM | POA: Diagnosis not present

## 2023-10-24 DIAGNOSIS — R509 Fever, unspecified: Secondary | ICD-10-CM | POA: Diagnosis not present

## 2023-10-24 DIAGNOSIS — Z20822 Contact with and (suspected) exposure to covid-19: Secondary | ICD-10-CM | POA: Diagnosis not present

## 2023-10-24 DIAGNOSIS — Z20828 Contact with and (suspected) exposure to other viral communicable diseases: Secondary | ICD-10-CM | POA: Diagnosis not present

## 2023-10-24 DIAGNOSIS — R0981 Nasal congestion: Secondary | ICD-10-CM | POA: Diagnosis not present

## 2023-11-22 ENCOUNTER — Emergency Department (HOSPITAL_BASED_OUTPATIENT_CLINIC_OR_DEPARTMENT_OTHER)

## 2023-11-22 ENCOUNTER — Encounter (HOSPITAL_BASED_OUTPATIENT_CLINIC_OR_DEPARTMENT_OTHER): Payer: Self-pay | Admitting: Emergency Medicine

## 2023-11-22 ENCOUNTER — Other Ambulatory Visit: Payer: Self-pay

## 2023-11-22 ENCOUNTER — Inpatient Hospital Stay (HOSPITAL_BASED_OUTPATIENT_CLINIC_OR_DEPARTMENT_OTHER)
Admission: EM | Admit: 2023-11-22 | Discharge: 2023-11-25 | DRG: 308 | Disposition: A | Discharge status: Home Health | Attending: Internal Medicine | Admitting: Internal Medicine

## 2023-11-22 DIAGNOSIS — R000 Tachycardia, unspecified: Secondary | ICD-10-CM | POA: Diagnosis not present

## 2023-11-22 DIAGNOSIS — D6959 Other secondary thrombocytopenia: Secondary | ICD-10-CM | POA: Diagnosis present

## 2023-11-22 DIAGNOSIS — K824 Cholesterolosis of gallbladder: Secondary | ICD-10-CM | POA: Diagnosis present

## 2023-11-22 DIAGNOSIS — I480 Paroxysmal atrial fibrillation: Secondary | ICD-10-CM

## 2023-11-22 DIAGNOSIS — R6510 Systemic inflammatory response syndrome (SIRS) of non-infectious origin without acute organ dysfunction: Secondary | ICD-10-CM | POA: Diagnosis present

## 2023-11-22 DIAGNOSIS — E876 Hypokalemia: Secondary | ICD-10-CM | POA: Diagnosis present

## 2023-11-22 DIAGNOSIS — F1090 Alcohol use, unspecified, uncomplicated: Secondary | ICD-10-CM | POA: Diagnosis present

## 2023-11-22 DIAGNOSIS — R42 Dizziness and giddiness: Secondary | ICD-10-CM | POA: Diagnosis not present

## 2023-11-22 DIAGNOSIS — I700 Atherosclerosis of aorta: Secondary | ICD-10-CM | POA: Diagnosis present

## 2023-11-22 DIAGNOSIS — I4891 Unspecified atrial fibrillation: Secondary | ICD-10-CM | POA: Diagnosis present

## 2023-11-22 DIAGNOSIS — F10931 Alcohol use, unspecified with withdrawal delirium: Secondary | ICD-10-CM | POA: Diagnosis not present

## 2023-11-22 DIAGNOSIS — I4892 Unspecified atrial flutter: Secondary | ICD-10-CM | POA: Diagnosis not present

## 2023-11-22 DIAGNOSIS — R6511 Systemic inflammatory response syndrome (SIRS) of non-infectious origin with acute organ dysfunction: Secondary | ICD-10-CM | POA: Diagnosis present

## 2023-11-22 DIAGNOSIS — I10 Essential (primary) hypertension: Secondary | ICD-10-CM | POA: Diagnosis present

## 2023-11-22 DIAGNOSIS — E860 Dehydration: Secondary | ICD-10-CM | POA: Diagnosis present

## 2023-11-22 DIAGNOSIS — W010XXA Fall on same level from slipping, tripping and stumbling without subsequent striking against object, initial encounter: Secondary | ICD-10-CM | POA: Diagnosis present

## 2023-11-22 DIAGNOSIS — D696 Thrombocytopenia, unspecified: Secondary | ICD-10-CM

## 2023-11-22 DIAGNOSIS — E872 Acidosis, unspecified: Secondary | ICD-10-CM | POA: Diagnosis not present

## 2023-11-22 DIAGNOSIS — J9811 Atelectasis: Secondary | ICD-10-CM | POA: Diagnosis not present

## 2023-11-22 DIAGNOSIS — R918 Other nonspecific abnormal finding of lung field: Secondary | ICD-10-CM | POA: Diagnosis not present

## 2023-11-22 DIAGNOSIS — R296 Repeated falls: Secondary | ICD-10-CM | POA: Diagnosis not present

## 2023-11-22 DIAGNOSIS — R590 Localized enlarged lymph nodes: Secondary | ICD-10-CM | POA: Diagnosis not present

## 2023-11-22 DIAGNOSIS — K760 Fatty (change of) liver, not elsewhere classified: Secondary | ICD-10-CM | POA: Diagnosis present

## 2023-11-22 DIAGNOSIS — I2510 Atherosclerotic heart disease of native coronary artery without angina pectoris: Secondary | ICD-10-CM | POA: Diagnosis present

## 2023-11-22 DIAGNOSIS — E874 Mixed disorder of acid-base balance: Secondary | ICD-10-CM | POA: Diagnosis present

## 2023-11-22 DIAGNOSIS — Z9181 History of falling: Secondary | ICD-10-CM

## 2023-11-22 DIAGNOSIS — I471 Supraventricular tachycardia, unspecified: Secondary | ICD-10-CM | POA: Diagnosis present

## 2023-11-22 DIAGNOSIS — G9349 Other encephalopathy: Secondary | ICD-10-CM | POA: Diagnosis present

## 2023-11-22 DIAGNOSIS — Y92009 Unspecified place in unspecified non-institutional (private) residence as the place of occurrence of the external cause: Secondary | ICD-10-CM

## 2023-11-22 DIAGNOSIS — S022XXA Fracture of nasal bones, initial encounter for closed fracture: Secondary | ICD-10-CM | POA: Diagnosis present

## 2023-11-22 DIAGNOSIS — R7989 Other specified abnormal findings of blood chemistry: Secondary | ICD-10-CM | POA: Diagnosis not present

## 2023-11-22 LAB — I-STAT VENOUS BLOOD GAS, ED
Acid-Base Excess: 1 mmol/L (ref 0.0–2.0)
Bicarbonate: 21.5 mmol/L (ref 20.0–28.0)
Calcium, Ion: 1.13 mmol/L — ABNORMAL LOW (ref 1.15–1.40)
HCT: 39 % (ref 39.0–52.0)
Hemoglobin: 13.3 g/dL (ref 13.0–17.0)
O2 Saturation: 68 %
Potassium: 3.3 mmol/L — ABNORMAL LOW (ref 3.5–5.1)
Sodium: 141 mmol/L (ref 135–145)
TCO2: 22 mmol/L (ref 22–32)
pCO2, Ven: 23.5 mmHg — ABNORMAL LOW (ref 44–60)
pH, Ven: 7.569 — ABNORMAL HIGH (ref 7.25–7.43)
pO2, Ven: 29 mmHg — CL (ref 32–45)

## 2023-11-22 LAB — URINALYSIS, ROUTINE W REFLEX MICROSCOPIC
Bilirubin Urine: NEGATIVE
Glucose, UA: NEGATIVE mg/dL
Hgb urine dipstick: NEGATIVE
Ketones, ur: 15 mg/dL — AB
Leukocytes,Ua: NEGATIVE
Nitrite: NEGATIVE
Protein, ur: NEGATIVE mg/dL
Specific Gravity, Urine: 1.009 (ref 1.005–1.030)
pH: 7 (ref 5.0–8.0)

## 2023-11-22 LAB — COMPREHENSIVE METABOLIC PANEL WITH GFR
ALT: 61 U/L — ABNORMAL HIGH (ref 0–44)
AST: 109 U/L — ABNORMAL HIGH (ref 15–41)
Albumin: 3.6 g/dL (ref 3.5–5.0)
Alkaline Phosphatase: 54 U/L (ref 38–126)
Anion gap: 24 — ABNORMAL HIGH (ref 5–15)
BUN: 8 mg/dL (ref 8–23)
CO2: 16 mmol/L — ABNORMAL LOW (ref 22–32)
Calcium: 9.4 mg/dL (ref 8.9–10.3)
Chloride: 103 mmol/L (ref 98–111)
Creatinine, Ser: 0.77 mg/dL (ref 0.61–1.24)
GFR, Estimated: >60 mL/min (ref >60)
Glucose, Bld: 81 mg/dL (ref 70–99)
Potassium: 3.4 mmol/L — ABNORMAL LOW (ref 3.5–5.1)
Sodium: 142 mmol/L (ref 135–145)
Total Bilirubin: 1.1 mg/dL (ref 0.0–1.2)
Total Protein: 6.3 g/dL — ABNORMAL LOW (ref 6.5–8.1)

## 2023-11-22 LAB — CBC
HCT: 38.1 % — ABNORMAL LOW (ref 39.0–52.0)
Hemoglobin: 13.6 g/dL (ref 13.0–17.0)
MCH: 35.8 pg — ABNORMAL HIGH (ref 26.0–34.0)
MCHC: 35.7 g/dL (ref 30.0–36.0)
MCV: 100.3 fL — ABNORMAL HIGH (ref 80.0–100.0)
Platelets: 84 10*3/uL — ABNORMAL LOW (ref 150–400)
RBC: 3.8 MIL/uL — ABNORMAL LOW (ref 4.22–5.81)
RDW: 13.5 % (ref 11.5–15.5)
WBC: 3.6 10*3/uL — ABNORMAL LOW (ref 4.0–10.5)
nRBC: 0 % (ref 0.0–0.2)

## 2023-11-22 LAB — TSH: TSH: 2.29 u[IU]/mL (ref 0.350–4.500)

## 2023-11-22 LAB — URINE DRUG SCREEN
Amphetamines: NOT DETECTED
Barbiturates: NOT DETECTED
Benzodiazepines: NOT DETECTED
Cocaine: NOT DETECTED
Fentanyl: NOT DETECTED
Methadone Scn, Ur: NOT DETECTED
Opiates: NOT DETECTED
Tetrahydrocannabinol: NOT DETECTED

## 2023-11-22 LAB — ETHANOL: Alcohol, Ethyl (B): <15 mg/dL (ref <15)

## 2023-11-22 LAB — LACTIC ACID, PLASMA: Lactic Acid, Venous: 7 mmol/L (ref 0.5–1.9)

## 2023-11-22 LAB — SALICYLATE LEVEL: Salicylate Lvl: <7 mg/dL — ABNORMAL LOW (ref 7.0–30.0)

## 2023-11-22 LAB — TROPONIN T, HIGH SENSITIVITY
Troponin T High Sensitivity: <15 ng/L (ref <19)
Troponin T High Sensitivity: <15 ng/L (ref <19)

## 2023-11-22 LAB — D-DIMER, QUANTITATIVE: D-Dimer, Quant: 1.53 ug{FEU}/mL — ABNORMAL HIGH (ref 0.00–0.50)

## 2023-11-22 MED ORDER — LORAZEPAM 2 MG/ML IJ SOLN
0.5000 mg | Freq: Once | INTRAMUSCULAR | Status: AC
  Administered 2023-11-22: 0.5 mg via INTRAVENOUS
  Filled 2023-11-22: qty 1

## 2023-11-22 MED ORDER — IOHEXOL 350 MG/ML SOLN
75.0000 mL | Freq: Once | INTRAVENOUS | Status: AC | PRN
Start: 2023-11-22 — End: 2023-11-22
  Administered 2023-11-22: 75 mL via INTRAVENOUS

## 2023-11-22 MED ORDER — METRONIDAZOLE 500 MG/100ML IV SOLN
500.0000 mg | Freq: Once | INTRAVENOUS | Status: AC
  Administered 2023-11-23: 500 mg via INTRAVENOUS
  Filled 2023-11-22: qty 100

## 2023-11-22 MED ORDER — DILTIAZEM HCL-DEXTROSE 125-5 MG/125ML-% IV SOLN (PREMIX)
5.0000 mg/h | INTRAVENOUS | Status: DC
  Administered 2023-11-22: 5 mg/h via INTRAVENOUS
  Administered 2023-11-23: 10 mg/h via INTRAVENOUS
  Filled 2023-11-22 (×2): qty 125

## 2023-11-22 MED ORDER — LACTATED RINGERS IV BOLUS
1000.0000 mL | Freq: Once | INTRAVENOUS | Status: AC
  Administered 2023-11-23: 1000 mL via INTRAVENOUS

## 2023-11-22 NOTE — ED Notes (Signed)
 Lab called a lactic of 7.0. Dr. Tamela Fake aware.

## 2023-11-22 NOTE — ED Triage Notes (Addendum)
 Pt arrived from home with c/o fall x2 over a week ago. C/o dizziness and "lump on his nose/forehead". No blood thinners. Also c/o shaking since this happened.

## 2023-11-22 NOTE — ED Provider Notes (Signed)
 [place] EMERGENCY DEPARTMENT AT [facility] Provider Note   CSN: [id number] Arrival date & time: 11/22/23  1744     History Chief Complaint  Patient presents with   Gabriel Everett is a 78 y.o. male patient who presents to the emergency department today for further evaluation of lightheadedness and dizziness over the last 2 weeks intermittently since a mechanical trip and fall.  He states he was initially feeding his neighbors cats when he tripped and fell and struck his head and face against the cat food bowl.  He did not lose consciousness nor is he anticoagulated.  Since then he has been having these lightheaded spells intermittently.  Nothing seems to bring these on or make them worse.  He denies any chest pain or shortness of breath with these episodes.  He also endorses associated tremulous feeling.  He denies any focal weakness or numbness.    Fall       Home Medications Prior to Admission medications   Medication Sig Start Date End Date Taking? Authorizing Provider  doxycycline (MONODOX) 100 MG capsule Take 100 mg by mouth 2 (two) times daily. 07/24/22   [provider]  ibuprofen  (ADVIL ,MOTRIN ) 400 MG tablet Take 1 tablet (400 mg total) by mouth every 6 (six) hours as needed. 02/16/18   Layden, Lindsey A, PA-C  methocarbamol  (ROBAXIN ) 500 MG tablet Take 1 tablet (500 mg total) by mouth 2 (two) times daily. Patient not taking: Reported on 08/09/2022 02/16/18   Layden, Lindsey A, PA-C  predniSONE (DELTASONE) 10 MG tablet as directed. 05/14/17   [provider]  Rivaroxaban  (XARELTO ) 15 MG TABS tablet Take 1 tablet (15 mg total) by mouth daily with supper. Patient not taking: Reported on 08/09/2022 05/17/17   Asa Lauth, NP      Allergies    Patient has no known allergies.    Review of Systems   Review of Systems  All other systems reviewed and are negative.   Physical Exam Updated Vital Signs BP (!) 153/68   Pulse 83   Temp  98.5 F (36.9 C) (Oral)   Resp (!) 27   Ht 5\' 10"  (1.778 m)   Wt 61.2 kg   SpO2 98%   BMI 19.37 kg/m  Physical Exam Vitals and nursing note reviewed.  Constitutional:      General: He is not in acute distress.    Appearance: Normal appearance.  HENT:     Head: Normocephalic and atraumatic.  Eyes:     General:        Right eye: No discharge.        Left eye: No discharge.  Cardiovascular:     Comments: Regular rate and rhythm.  S1/S2 are distinct without any evidence of murmur, rubs, or gallops.  Radial pulses are 2+ bilaterally.  Dorsalis pedis pulses are 2+ bilaterally.  No evidence of pedal edema. Pulmonary:     Comments: Clear to auscultation bilaterally.  Normal effort.  No respiratory distress.  No evidence of wheezes, rales, or rhonchi heard throughout. Abdominal:     General: Abdomen is flat. Bowel sounds are normal. There is no distension.     Tenderness: There is no abdominal tenderness. There is no guarding or rebound.  Musculoskeletal:        General: Normal range of motion.     Cervical back: Neck supple.  Skin:    General: Skin is warm and dry.     Findings: No rash.  Neurological:     General: No focal deficit present.     Mental Status: He is alert and oriented to person, place, and time.     GCS: GCS eye subscore is 4. GCS verbal subscore is 5. GCS motor subscore is 6.     Comments: Resting tremor in the bilateral hands.  Cranial nerves II through XII are intact.  5/5 strength to the upper and lower extremities.  No dysmetria with finger-to-nose.  No facial droop.  Answers all questions appropriately.  Psychiatric:        Mood and Affect: Mood normal.        Behavior: Behavior normal.     ED Results / Procedures / Treatments   Labs (all labs ordered are listed, but only abnormal results are displayed) Labs Reviewed  COMPREHENSIVE METABOLIC PANEL WITH GFR - Abnormal; Notable for the following components:      Result Value   Potassium 3.4 (*)    CO2 16  (*)    Total Protein 6.3 (*)    AST 109 (*)    ALT 61 (*)    Anion gap 24 (*)    All other components within normal limits  CBC - Abnormal; Notable for the following components:   WBC 3.6 (*)    RBC 3.80 (*)    HCT 38.1 (*)    MCV 100.3 (*)    MCH 35.8 (*)    Platelets 84 (*)    All other components within normal limits  D-DIMER, QUANTITATIVE - Abnormal; Notable for the following components:   D-Dimer, Quant 1.53 (*)    All other components within normal limits  LACTIC ACID, PLASMA - Abnormal; Notable for the following components:   Lactic Acid, Venous 7.0 (*)    All other components within normal limits  URINALYSIS, ROUTINE W REFLEX MICROSCOPIC - Abnormal; Notable for the following components:   Ketones, ur 15 (*)    All other components within normal limits  BETA-HYDROXYBUTYRIC ACID - Abnormal; Notable for the following components:   Beta-Hydroxybutyric Acid 1.22 (*)    All other components within normal limits  SALICYLATE LEVEL - Abnormal; Notable for the following components:   Salicylate Lvl <7.0 (*)    All other components within normal limits  I-STAT VENOUS BLOOD GAS, ED - Abnormal; Notable for the following components:   pH, Ven 7.569 (*)    pCO2, Ven 23.5 (*)    pO2, Ven 29 (*)    Potassium 3.3 (*)    Calcium, Ion 1.13 (*)    All other components within normal limits  CULTURE, BLOOD (ROUTINE X 2)  CULTURE, BLOOD (ROUTINE X 2)  TSH  URINE DRUG SCREEN  ETHANOL  PROTIME-INR  LACTIC ACID, PLASMA  TROPONIN T, HIGH SENSITIVITY  TROPONIN T, HIGH SENSITIVITY    EKG EKG Interpretation Date/Time:  Thursday November 22 2023 19:32:45 EDT Ventricular Rate:  109 PR Interval:  172 QRS Duration:  101 QT Interval:  350 QTC Calculation: 472 R Axis:   -72  Text Interpretation: Suspect atrial fibrillation and sinus rhythm Left anterior fascicular block Probable anteroseptal infarct, old Confirmed by Scarlette Currier ([id number]) on 11/22/2023 9:20:37 PM  Radiology US  Abdomen  Limited RUQ (LIVER/GB) Result Date: 11/23/2023 CLINICAL DATA:  Transaminitis EXAM: ULTRASOUND ABDOMEN LIMITED RIGHT UPPER QUADRANT COMPARISON:  CT abdomen and pelvis 12/13/2009 FINDINGS: Gallbladder: Non dependent nonshadowing filling defect along the lateral gallbladder wall measuring 6 mm consistent with gallbladder polyp. Based on size criteria, this is likely benign and  no imaging follow-up is indicated. No stones identified. No gallbladder wall thickening or edema. Murphy's sign is negative. Common bile duct: Diameter: 3 mm, normal Liver: Diffusely increased hepatic parenchymal echotexture suggesting diffuse fatty infiltration. No focal lesions are identified. Portal vein is patent on color Doppler imaging with normal direction of blood flow towards the liver. Other: None. IMPRESSION: 1. 6 mm polyp in the gallbladder, likely benign. 2. No evidence of cholelithiasis or acute cholecystitis. 3. Increased hepatic parenchymal echotexture suggesting diffuse fatty infiltration. Electronically Signed   By: Boyce Byes M.D.   On: 11/23/2023 00:28   CT Angio Chest PE W and/or Wo Contrast Result Date: 11/22/2023 CLINICAL DATA:  Elevated D-dimer EXAM: CT ANGIOGRAPHY CHEST WITH CONTRAST TECHNIQUE: Multidetector CT imaging of the chest was performed using the standard protocol during bolus administration of intravenous contrast. Multiplanar CT image reconstructions and MIPs were obtained to evaluate the vascular anatomy. RADIATION DOSE REDUCTION: This exam was performed according to the departmental dose-optimization program which includes automated exposure control, adjustment of the mA and/or kV according to patient size and/or use of iterative reconstruction technique. CONTRAST:  75mL OMNIPAQUE  IOHEXOL  350 MG/ML SOLN COMPARISON:  CT chest 09/07/2022 FINDINGS: Cardiovascular: Satisfactory opacification of the pulmonary arteries to the segmental level. No evidence of pulmonary embolism. Normal heart size. No  pericardial effusion. There are atherosclerotic calcifications of the aorta. Mediastinum/Nodes: There are prominent right hilar and subcarinal lymph nodes. There is enlarged subcarinal lymph node measuring 10 mm. Visualized esophagus and thyroid gland are within normal limits. Lungs/Pleura: There is lingular atelectasis. The lungs are otherwise clear. There is a 2 mm nodule in the left upper lobe image 6/58. Upper Abdomen: No acute abnormality. Musculoskeletal: No chest wall abnormality. No acute or significant osseous findings. Review of the MIP images confirms the above findings. IMPRESSION: 1. No evidence for pulmonary embolism. 2. Lingular atelectasis. 3. 2 mm left solid pulmonary nodule within the upper lobe. No follow-up needed if patient is low-risk.This recommendation follows the consensus statement: Guidelines for Management of Incidental Pulmonary Nodules Detected on CT Images: From the Fleischner Society 2017; Radiology 2017; 284:228-243. 4. Prominent right hilar and subcarinal lymph nodes, nonspecific. 5. Aortic atherosclerosis. Aortic Atherosclerosis (ICD10-I70.0). Electronically Signed   By: Tyron Gallon M.D.   On: 11/22/2023 23:10   CT Maxillofacial Wo Contrast Result Date: 11/22/2023 CLINICAL DATA:  Blunt facial trauma. Fall over a week ago. Patient reports lump on his nose. EXAM: CT MAXILLOFACIAL WITHOUT CONTRAST TECHNIQUE: Multidetector CT imaging of the maxillofacial structures was performed. Multiplanar CT image reconstructions were also generated. RADIATION DOSE REDUCTION: This exam was performed according to the departmental dose-optimization program which includes automated exposure control, adjustment of the mA and/or kV according to patient size and/or use of iterative reconstruction technique. COMPARISON:  None Available. FINDINGS: Osseous: Moderately depressed right minimally depressed left nasal bone fracture. Mild rightward nasal septal bowing. No acute fracture of the zygomatic  arches, mandibles, maxilla or pterygoid plates. Orbits: No orbital fracture.  No evidence of globe injury. Sinuses: No sinus fracture or hemosinus. Minor mucosal thickening of the maxillary sinuses. No mastoid effusion. Soft tissues: Nasal soft tissue thickening. Limited intracranial: Assessed on head CT performed concurrently. IMPRESSION: Moderately depressed right and minimally depressed left nasal bone fracture. Electronically Signed   By: Chadwick Colonel M.D.   On: 11/22/2023 19:45   CT Cervical Spine Wo Contrast Result Date: 11/22/2023 CLINICAL DATA:  Neck trauma (Age >= 65y) Fall 2 x 2 over a week ago.  Dizziness. EXAM:  CT CERVICAL SPINE WITHOUT CONTRAST TECHNIQUE: Multidetector CT imaging of the cervical spine was performed without intravenous contrast. Multiplanar CT image reconstructions were also generated. RADIATION DOSE REDUCTION: This exam was performed according to the departmental dose-optimization program which includes automated exposure control, adjustment of the mA and/or kV according to patient size and/or use of iterative reconstruction technique. COMPARISON:  CT cervical spine 02/16/2018 FINDINGS: Alignment: Normal. Skull base and vertebrae: No acute fracture. Vertebral body heights are maintained. The dens and skull base are intact. Chronic degeneration at C1-C2. Soft tissues and spinal canal: No prevertebral fluid or swelling. No visible canal hematoma. Disc levels: Multilevel degenerative disc disease is most prominent at C6-C7. There is mild multilevel facet hypertrophy. Upper chest: No acute findings. Other: Carotid calcifications. IMPRESSION: 1. No acute fracture or subluxation of the cervical spine. 2. Multilevel degenerative disc disease and facet hypertrophy. Electronically Signed   By: Chadwick Colonel M.D.   On: 11/22/2023 19:41   CT Head Wo Contrast Result Date: 11/22/2023 CLINICAL DATA:  Head trauma, moderate-severe Two falls over a week ago.  Dizziness. EXAM: CT HEAD WITHOUT  CONTRAST TECHNIQUE: Contiguous axial images were obtained from the base of the skull through the vertex without intravenous contrast. RADIATION DOSE REDUCTION: This exam was performed according to the departmental dose-optimization program which includes automated exposure control, adjustment of the mA and/or kV according to patient size and/or use of iterative reconstruction technique. COMPARISON:  None Available. FINDINGS: Brain: No intracranial hemorrhage, mass effect, or midline shift. Age related atrophy. No hydrocephalus. The basilar cisterns are patent. Mild periventricular and deep white matter hypodensity typical of chronic small vessel ischemia. No evidence of territorial infarct or acute ischemia. No extra-axial or intracranial fluid collection. Vascular: No hyperdense vessel or unexpected calcification. Skull: No fracture or focal lesion. Sinuses/Orbits: Assessed on concurrent face CT, reported separately. Other: No confluent scalp hematoma. IMPRESSION: 1. No acute intracranial abnormality. No skull fracture. 2. Age related atrophy and chronic small vessel ischemia. Electronically Signed   By: Chadwick Colonel M.D.   On: 11/22/2023 19:38    Procedures .Critical Care  Performed by: Darletta Ehrich, PA-C Authorized by: Darletta Ehrich, PA-C   Critical care provider statement:    Critical care time (minutes):  40   Critical care time was exclusive of:  Separately billable procedures and treating other patients   Critical care was necessary to treat or prevent imminent or life-threatening deterioration of the following conditions:  Dehydration and circulatory failure   Critical care was time spent personally by me on the following activities:  Blood draw for specimens, development of treatment plan with patient or surrogate, discussions with consultants, discussions with primary provider, evaluation of patient's response to treatment, ordering and performing treatments and interventions,  ordering and review of laboratory studies, ordering and review of radiographic studies, pulse oximetry and re-evaluation of patient's condition   Care discussed with: admitting provider       Medications Ordered in ED Medications  diltiazem  (CARDIZEM ) 125 mg in dextrose  5% 125 mL (1 mg/mL) infusion (10 mg/hr Intravenous New Bag/Given 11/23/23 0005)  metroNIDAZOLE  (FLAGYL ) IVPB 500 mg (has no administration in time range)  ceFEPIme  (MAXIPIME ) 2 g in sodium chloride  0.9 % 100 mL IVPB (2 g Intravenous New Bag/Given 11/23/23 0027)  vancomycin  (VANCOCIN ) IVPB 1000 mg/200 mL premix (has no administration in time range)  LORazepam  (ATIVAN ) injection 0.5 mg (0.5 mg Intravenous Given 11/22/23 2240)  iohexol  (OMNIPAQUE ) 350 MG/ML injection 75 mL (75 mLs Intravenous Contrast  Given 11/22/23 2249)  lactated ringers  bolus 1,000 mL (1,000 mLs Intravenous New Bag/Given 11/23/23 0027)    ED Course/ Medical Decision Making/ A&P Clinical Course as of 11/23/23 0047  Fri Nov 23, 2023  0029 I spoke with Dr. Sanjuana Crutch with cardiology who agrees to consult on the patient.  [CF]  0031 CBC(!) There is evidence of leukopenia. No clinical signs of anemia. Thrombocytopenia.  [CF]  0031 Comprehensive metabolic panel(!) There is evidence of transaminitis and mild hypokalemia.  [CF]  0032 Troponin T, High Sensitivity Initial and delta troponin are normal.  [CF]  0032 I-Stat venous blood gas, ([facility], [facility], [facility])(!!) Evidence of metabolic alkalosis.  [CF]  0032 Urinalysis, Routine w reflex microscopic -Urine, Clean Catch(!) Negative.  [CF]  0032 Lactic acid, plasma(!!) Initial lactic is significantly elevated.  [CF]  0033 D-dimer, quantitative(!) Elevated.  [CF]  1610 Urine Drug Screen Negative.  [CF]  0033 TSH Normal.  [CF]  9604 Spoke with Dr. Michell Ahumada with [facility] hospital[facility] who agrees to admit the patient.  [CF]    Clinical Course User Index [CF] Darletta Ehrich, PA-C   {   Click here for ABCD2, HEART and other  calculators  Medical Decision Making TIMMY BUBECK is a 78 y.o. male patient who presents to the emergency from today for further evaluation of lightheadedness and dizziness after a fall.  Patient was sent here for scans of the head.  Will add on's CT cervical spine and maxillofacial area.  While I was talking with the patient he did have multiple episodes where his heart rate would go into the 130s and 140s and become irregular.  This would last for several seconds and resolve spontaneously.  Patient did not have any symptoms during this time.  He just attributes this to being an anxious person.  Patient does have a remote history of atrial fibrillation approximately 3 years ago but is not currently anticoagulated.  Patient is still having runs of tachycardia despite being on the diltiazem  drip and with a little bit of Ativan .  Imaging was performed and interpreted by myself.  CT maxillofacial does show depressed nasal fracture.  Ultrasound is pending given transaminitis.  No evidence of pulmonary embolism on CT angio.  CT head and CT cervical spine are normal.  Given the patient's excessively high lactic acidosis, tachycardia, leukopenia, thrombocytopenia, and transaminitis I do feel the patient would likely benefit from further evaluation inside the hospital.  Cardiology is consulted.  [facility] hospitalist as well has been contacted. He is stable for admission.   Amount and/or Complexity of Data Reviewed Labs: ordered. Decision-making details documented in ED Course. Radiology: ordered.  Risk Prescription drug management. Decision regarding hospitalization.    Final Clinical Impression(s) / ED Diagnoses Final diagnoses:  Lightheadedness  Lactic acidosis  Tachycardia  Thrombocytopenia [facility])    Rx / DC Orders ED Discharge Orders     None         Angelyn Kennel [place], [place] 11/23/23 [id number]    Scarlette Currier, MD 11/24/23 [phone]

## 2023-11-22 NOTE — Progress Notes (Signed)
 ED Pharmacy Antibiotic Sign Off An antibiotic consult was received from an ED provider for cefepime/vancomycin per pharmacy dosing for sepsis. A chart review was completed to assess appropriateness.   The following one time order(s) were placed:   -Cefepime 2g IV x1 -Vancomycin 1g IV x1  Further antibiotic and/or antibiotic pharmacy consults should be ordered by the admitting provider if indicated.   Thank you for allowing pharmacy to be a part of this patient's care.   Young Hensen, [facility]  Clinical Pharmacist 11/22/23 11:59 PM

## 2023-11-22 NOTE — ED Notes (Signed)
 Pt aware of the need for a urine... Unable to currently provide a sample.Gabriel KitchenMarland Everett

## 2023-11-22 NOTE — ED Notes (Signed)
 RT collected/ran VBG on pt while on RA. MD notified of vbg results. Pt respiratory status stable on RA w/no distress noted at this time.   Latest Reference Range & Units 11/22/23 22:09  Sample type  VENOUS  pH, Ven 7.25 - 7.43  7.569 (H)  pCO2, Ven 44 - 60 mmHg 23.5 (L)  pO2, Ven 32 - 45 mmHg 29 (LL)  TCO2 22 - 32 mmol/L 22  Acid-Base Excess 0.0 - 2.0 mmol/L 1.0  Bicarbonate 20.0 - 28.0 mmol/L 21.5  O2 Saturation % 68    11/22/23 2209  Therapy Vitals  Pulse Rate 88  Resp 20  Patient Position (if appropriate) Lying  MEWS Score/Color  MEWS Score 0  MEWS Score Color Green  Respiratory Assessment  $ RT Protocol Assessment  Yes  Assessment Type Assess only  Respiratory Pattern Regular;Unlabored;Symmetrical  Chest Assessment Chest expansion symmetrical  Cough None  Bilateral Breath Sounds Clear;Diminished  Oxygen Therapy/Pulse Ox  O2 Device Room Air  O2 Therapy Room air  SpO2 100 %

## 2023-11-23 ENCOUNTER — Emergency Department (HOSPITAL_BASED_OUTPATIENT_CLINIC_OR_DEPARTMENT_OTHER)

## 2023-11-23 DIAGNOSIS — R296 Repeated falls: Secondary | ICD-10-CM | POA: Diagnosis not present

## 2023-11-23 DIAGNOSIS — K824 Cholesterolosis of gallbladder: Secondary | ICD-10-CM

## 2023-11-23 DIAGNOSIS — I4892 Unspecified atrial flutter: Secondary | ICD-10-CM | POA: Diagnosis not present

## 2023-11-23 DIAGNOSIS — W19XXXA Unspecified fall, initial encounter: Secondary | ICD-10-CM | POA: Diagnosis not present

## 2023-11-23 DIAGNOSIS — F1090 Alcohol use, unspecified, uncomplicated: Secondary | ICD-10-CM | POA: Diagnosis present

## 2023-11-23 DIAGNOSIS — E876 Hypokalemia: Secondary | ICD-10-CM | POA: Diagnosis present

## 2023-11-23 DIAGNOSIS — K838 Other specified diseases of biliary tract: Secondary | ICD-10-CM | POA: Diagnosis not present

## 2023-11-23 DIAGNOSIS — R7401 Elevation of levels of liver transaminase levels: Secondary | ICD-10-CM | POA: Diagnosis not present

## 2023-11-23 DIAGNOSIS — I2510 Atherosclerotic heart disease of native coronary artery without angina pectoris: Secondary | ICD-10-CM | POA: Diagnosis not present

## 2023-11-23 DIAGNOSIS — I483 Typical atrial flutter: Secondary | ICD-10-CM

## 2023-11-23 DIAGNOSIS — I10 Essential (primary) hypertension: Secondary | ICD-10-CM

## 2023-11-23 DIAGNOSIS — R6510 Systemic inflammatory response syndrome (SIRS) of non-infectious origin without acute organ dysfunction: Secondary | ICD-10-CM | POA: Diagnosis not present

## 2023-11-23 DIAGNOSIS — I471 Supraventricular tachycardia, unspecified: Secondary | ICD-10-CM | POA: Insufficient documentation

## 2023-11-23 DIAGNOSIS — R000 Tachycardia, unspecified: Secondary | ICD-10-CM | POA: Diagnosis not present

## 2023-11-23 LAB — BETA-HYDROXYBUTYRIC ACID: Beta-Hydroxybutyric Acid: 1.22 mmol/L — ABNORMAL HIGH (ref 0.05–0.27)

## 2023-11-23 LAB — LACTIC ACID, PLASMA: Lactic Acid, Venous: 3.9 mmol/L (ref 0.5–1.9)

## 2023-11-23 LAB — SEDIMENTATION RATE: Sed Rate: 4 mm/h (ref 0–16)

## 2023-11-23 LAB — HEMOGLOBIN A1C
Hgb A1c MFr Bld: 4.6 % — ABNORMAL LOW (ref 4.8–5.6)
Mean Plasma Glucose: 85.32 mg/dL

## 2023-11-23 LAB — PROTIME-INR
INR: 1 (ref 0.8–1.2)
Prothrombin Time: 13.4 s (ref 11.4–15.2)

## 2023-11-23 LAB — C-REACTIVE PROTEIN: CRP: 0.6 mg/dL (ref <1.0)

## 2023-11-23 LAB — MAGNESIUM: Magnesium: 1.3 mg/dL — ABNORMAL LOW (ref 1.7–2.4)

## 2023-11-23 MED ORDER — THIAMINE HCL 100 MG/ML IJ SOLN: 100.0000 mg | Freq: Every day | INTRAMUSCULAR | Status: DC

## 2023-11-23 MED ORDER — TRIMETHOBENZAMIDE HCL 100 MG/ML IM SOLN: 200.0000 mg | Freq: Four times a day (QID) | INTRAMUSCULAR | Status: DC | PRN

## 2023-11-23 MED ORDER — POTASSIUM CHLORIDE CRYS ER 20 MEQ PO TBCR
40.0000 meq | EXTENDED_RELEASE_TABLET | ORAL | Status: AC
  Administered 2023-11-23: 40 meq via ORAL
  Filled 2023-11-23: qty 2

## 2023-11-23 MED ORDER — FENTANYL CITRATE PF 50 MCG/ML IJ SOSY: 25.0000 ug | PREFILLED_SYRINGE | INTRAMUSCULAR | Status: DC | PRN

## 2023-11-23 MED ORDER — ACETAMINOPHEN 325 MG PO TABS
650.0000 mg | ORAL_TABLET | Freq: Four times a day (QID) | ORAL | Status: DC | PRN
  Administered 2023-11-25: 650 mg via ORAL
  Filled 2023-11-23: qty 2

## 2023-11-23 MED ORDER — FOLIC ACID 1 MG PO TABS
1.0000 mg | ORAL_TABLET | Freq: Every day | ORAL | Status: DC
  Administered 2023-11-23 – 2023-11-25 (×3): 1 mg via ORAL
  Filled 2023-11-23 (×3): qty 1

## 2023-11-23 MED ORDER — METRONIDAZOLE 500 MG/100ML IV SOLN
500.0000 mg | Freq: Two times a day (BID) | INTRAVENOUS | Status: DC
  Administered 2023-11-23 – 2023-11-25 (×4): 500 mg via INTRAVENOUS
  Filled 2023-11-23 (×4): qty 100

## 2023-11-23 MED ORDER — THIAMINE MONONITRATE 100 MG PO TABS
100.0000 mg | ORAL_TABLET | Freq: Every day | ORAL | Status: DC
  Administered 2023-11-23 – 2023-11-25 (×3): 100 mg via ORAL
  Filled 2023-11-23 (×3): qty 1

## 2023-11-23 MED ORDER — ACETAMINOPHEN 650 MG RE SUPP: 650.0000 mg | Freq: Four times a day (QID) | RECTAL | Status: DC | PRN

## 2023-11-23 MED ORDER — ONDANSETRON HCL 4 MG PO TABS: 4.0000 mg | ORAL_TABLET | Freq: Four times a day (QID) | ORAL | Status: DC | PRN

## 2023-11-23 MED ORDER — ALBUTEROL SULFATE (2.5 MG/3ML) 0.083% IN NEBU: 2.5000 mg | INHALATION_SOLUTION | Freq: Four times a day (QID) | RESPIRATORY_TRACT | Status: DC | PRN

## 2023-11-23 MED ORDER — MAGNESIUM SULFATE 2 GM/50ML IV SOLN
2.0000 g | Freq: Once | INTRAVENOUS | Status: AC
  Administered 2023-11-23: 2 g via INTRAVENOUS
  Filled 2023-11-23: qty 50

## 2023-11-23 MED ORDER — VANCOMYCIN HCL 1.25 G IV SOLR
1250.0000 mg | INTRAVENOUS | Status: DC
  Administered 2023-11-24 (×2): 1250 mg via INTRAVENOUS
  Filled 2023-11-23 (×4): qty 25

## 2023-11-23 MED ORDER — DILTIAZEM HCL ER COATED BEADS 240 MG PO CP24
240.0000 mg | ORAL_CAPSULE | Freq: Every day | ORAL | Status: DC
  Administered 2023-11-24 – 2023-11-25 (×2): 240 mg via ORAL
  Filled 2023-11-23 (×2): qty 1

## 2023-11-23 MED ORDER — LORAZEPAM 1 MG PO TABS
1.0000 mg | ORAL_TABLET | ORAL | Status: DC | PRN
  Administered 2023-11-23: 2 mg via ORAL
  Administered 2023-11-24: 1 mg via ORAL
  Administered 2023-11-25: 2 mg via ORAL
  Filled 2023-11-23: qty 2
  Filled 2023-11-23: qty 4
  Filled 2023-11-23: qty 2
  Filled 2023-11-23: qty 1

## 2023-11-23 MED ORDER — ONDANSETRON HCL 4 MG/2ML IJ SOLN: 4.0000 mg | Freq: Four times a day (QID) | INTRAMUSCULAR | Status: DC | PRN

## 2023-11-23 MED ORDER — ADULT MULTIVITAMIN W/MINERALS CH
1.0000 | ORAL_TABLET | Freq: Every day | ORAL | Status: DC
  Administered 2023-11-23 – 2023-11-25 (×3): 1 via ORAL
  Filled 2023-11-23 (×3): qty 1

## 2023-11-23 MED ORDER — LORAZEPAM 2 MG/ML IJ SOLN
1.0000 mg | INTRAMUSCULAR | Status: DC | PRN
  Administered 2023-11-23: 2 mg via INTRAVENOUS
  Administered 2023-11-24: 4 mg via INTRAVENOUS
  Administered 2023-11-24: 3 mg via INTRAVENOUS
  Filled 2023-11-23 (×3): qty 1
  Filled 2023-11-23: qty 2

## 2023-11-23 MED ORDER — SODIUM CHLORIDE 0.9 % IV SOLN
2.0000 g | Freq: Three times a day (TID) | INTRAVENOUS | Status: DC
  Administered 2023-11-23 – 2023-11-24 (×4): 2 g via INTRAVENOUS
  Filled 2023-11-23 (×4): qty 12.5

## 2023-11-23 MED ORDER — ENOXAPARIN SODIUM 40 MG/0.4ML IJ SOSY
40.0000 mg | PREFILLED_SYRINGE | INTRAMUSCULAR | Status: DC
  Administered 2023-11-23 – 2023-11-24 (×2): 40 mg via SUBCUTANEOUS
  Filled 2023-11-23 (×2): qty 0.4

## 2023-11-23 MED ORDER — SODIUM CHLORIDE 0.9 % IV SOLN
2.0000 g | Freq: Once | INTRAVENOUS | Status: AC
  Administered 2023-11-23: 2 g via INTRAVENOUS
  Filled 2023-11-23: qty 12.5

## 2023-11-23 MED ORDER — VANCOMYCIN HCL IN DEXTROSE 1-5 GM/200ML-% IV SOLN
1000.0000 mg | Freq: Once | INTRAVENOUS | Status: AC
  Administered 2023-11-23: 1000 mg via INTRAVENOUS
  Filled 2023-11-23: qty 200

## 2023-11-23 NOTE — ED Notes (Signed)
 Pt Cardizem  infusing through R UA IV. Redness noted around site and pt states it is hurting. IV flushed. DC'd IV and MD notified. Heat pack applied. Cardizem  infusing through other site at this time.

## 2023-11-23 NOTE — ED Notes (Addendum)
 Called [facility] for repage of hospitalist   Called [facility]

## 2023-11-23 NOTE — H&P (Signed)
 History and Physical    Patient: Gabriel Everett XBJ:[id number] DOB: [birth year] DOA: 11/22/2023 DOS: the patient was seen and examined on 11/23/2023 PCP: Trinda Fuller, FNP  Patient coming from: [facility] transfer  Chief Complaint:  Chief Complaint  Patient presents with   Fall   HPI: Gabriel HELLENBRAND is a 78 y.o. male with medical history significant of atrial fibrillation previously on Xarelto , PVCs, presented with complaints of lightheadedness and dizziness.  He has been experiencing dizziness and episodes of rapid heart rate. Initially, he had an accident in February while feeding cats, resulting in a fall and head injury. Although he initially recovered, he began experiencing dizzy spells. Two weeks ago, he fell again at home after tripping over a box while feeling dizzy, prompting a visit to a neighbor who works at [employer].   His heart rate, as seen on the blood pressure machine, would sometimes be very high and then drop back down, but he did not specify exact numbers. No chest pain during these episodes. He has a history of atrial fibrillation diagnosed approximately 10-12 years ago, for which he was previously treated with blood thinners and propranolol. He is not currently taking any medications regularly, although he was prescribed propranolol in the past for nervousness and tremors in his hands. The nervousness and tremors have worsened with the dizziness.  He mentions a weight loss of about 8-10 pounds over the past 6-8 weeks, attributing it to being busy and not eating properly. He used to weigh around 155-160 pounds and now weighs approximately 146 pounds.  Socially, he is a [employer] certified accountant and consumes alcohol  occasionally, typically a shot of vodka with orange juice or a beer in the evening. He denies any history of smoking or withdrawal symptoms from alcohol .  In the ED, patient was intermittently tachycardic to the 150s and noted to be in SVT versus A-fib/flutter.  EDP  discussed with cardiology and patient was started on Cardizem  drip. Labs showing WBC count 3.6, platelet count 84k, potassium 3.4, bicarb 16, glucose 81, creatinine 0.7, AST 109, ALT 61, alk phos and T. bili normal, troponin negative x 2, TSH normal, UDS negative, UA without signs of infection, lactate 7.0> 3.9, salicylate level <7.0, ethanol level <15. CT angiogram chest negative for PE.  Urinalysis showed no signs for infection.  UDS was negative.  CT maxillofacial showing moderately depressed right and minimally depressed left nasal bone fracture.  CT head/C-spine negative for acute findings.   Right upper quadrant abdominal ultrasound showing 6 mm polyp in the gallbladder but no evidence of cholelithiasis or acute cholecystitis.  Showing diffuse fatty infiltration of the liver.  Patient was given Ativan, vancomycin, cefepime, metronidazole, 1 L IV fluids.    Review of Systems: As mentioned in the history of present illness. All other systems reviewed and are negative. Past Medical History:  Diagnosis Date   Allergy    Seasonal   Atrial flutter with rapid ventricular response (HCC) 06/18/13   Dizziness 06/18/13   due to "inner ear infection"   Elevated blood pressure    elevated in the office 07/14/13 though he does not carry a diagnosis of HTN   History of colonic diverticulitis 2010   Right-sided.   PVC's (premature ventricular contractions)    By monitor 05/22/13   Past Surgical History:  Procedure Laterality Date   none     Social History:  reports that he has never smoked. He has never used smokeless tobacco. He reports current alcohol  use of  about 21.0 - 28.0 standard drinks of alcohol  per week. He reports that he does not use drugs.  No Known Allergies  Family History  Problem Relation Age of Onset   Cancer Mother 59       esophageal    Prior to Admission medications   Medication Sig Start Date End Date Taking? Authorizing Provider  doxycycline (MONODOX) 100 MG capsule Take  100 mg by mouth 2 (two) times daily. 07/24/22   [provider]  ibuprofen  (ADVIL ,MOTRIN ) 400 MG tablet Take 1 tablet (400 mg total) by mouth every 6 (six) hours as needed. 02/16/18   Layden, Lindsey A, PA-C  methocarbamol  (ROBAXIN ) 500 MG tablet Take 1 tablet (500 mg total) by mouth 2 (two) times daily. Patient not taking: Reported on 08/09/2022 02/16/18   Layden, Lindsey A, PA-C  predniSONE (DELTASONE) 10 MG tablet as directed. 05/14/17   [provider]  Rivaroxaban  (XARELTO ) 15 MG TABS tablet Take 1 tablet (15 mg total) by mouth daily with supper. Patient not taking: Reported on 08/09/2022 05/17/17   Asa Lauth, NP    Physical Exam: Vitals:   11/23/23 0900 11/23/23 1000 11/23/23 1100 11/23/23 1307  BP: 128/65 (!) 140/59 128/79 (!) 153/85  Pulse: 75 66 64 78  Resp: 20 20 19 17   Temp: 98 F (36.7 C)   98.6 F (37 C)  TempSrc: Oral   Oral  SpO2: 98% 100% 100% 100%  Weight:    66.5 kg  Height:    5\' 10"  (1.778 m)   Constitutional: Elderly male who appears ill but in no acute distress. Eyes: PERRL, lids and conjunctivae normal ENMT: Mucous membranes are moist.  Normal dentition.  Neck: normal, supple  Respiratory: clear to auscultation bilaterally, no wheezing, no crackles. Normal respiratory effort. No accessory muscle use.  Cardiovascular: Regular rate and rhythm, no murmurs / rubs / gallops. No extremity edema. 2+ pedal pulses. No carotid bruits.  Abdomen: no tenderness, no masses palpated. No hepatosplenomegaly. Bowel sounds positive.  Musculoskeletal: no clubbing / cyanosis. No joint deformity upper and lower extremities. Good ROM, no contractures. Normal muscle tone.  Skin: no rashes, lesions, ulcers. No induration Neurologic: CN 2-12 grossly intact. Sensation intact, DTR normal. Strength 5/5 in all 4.  Psychiatric: Normal judgment and insight. Alert and oriented x 3. Normal mood.    Data Reviewed:   EKG revealed atrial flutter 144 bpm.  Reviewed labs,  imaging, and pertinent records as documented  Assessment and Plan:  SIRS Patient was noted to be tachypneic and tachycardic with leukopenia meeting SIRS criteria.  Initial lactic acid elevated at 7 with repeat check 3.9.  Blood cultures were obtained and patient was started on empiric antibiotics of vancomycin, cefepime, and metronidazole.  CTA of the chest did not reveal any signs for a pulmonary embolism.  Right upper quadrant ultrasound showed no signs for cholecystitis.  Urinalysis showed no acute abnormality.  - Admit to a progressive bed - Follow-up blood cultures - Check ESR and CRP - Continue to trend lactic acid levels. - Did not continue antibiotics at this time as no clear source of infection appreciated in symptoms  Paroxysmal atrial flutter Patient presented with heart rates into the 140s.  TSH was noted to be within normal limits at 2.29.  Patient with prior history of atrial flutters/fibrillation as seen on event monitor from back in 2014.  Patient not on any anticoagulation for treatment. CHA2DS2-VASc score equal to 4.  Due to recent history of falls will defer  anticoagulation at this time - Goal potassium at least 4 and magnesium at least 2 - Continue Cardizem  drip - Check echocardiogram - Cardiology consulted,  will follow-up for any further recommendations.  Falls Patient reports history of 2 recent falls with trauma to his head due to lightheadedness.  Unclear at this time if symptoms are related to patient being in atrial flutter - Physical therapy to evaluate and treat  Essential hypertension Blood pressure elevated up to 174/101.  Patient does not appear to be on any medications at home for treatment. - Hydralazine IV prn elevated blood pressure  Hypokalemia Acute.  Potassium 3.4. - Give 60 mEq of potassium chloride  p.o.  Hypomagnesemia Acute.  Magnesium 1.3. - Give 2 g of magnesium sulfate-continue to monitor replace as needed  Gallbladder polyp Patient  incidentally noted to have a 6 mm gallbladder polyp on ultrasound. - Possibly could consider repeat right upper quadrant ultrasound in 1 to 2 years if patient is at increased risk  Alcohol  use Patient only reports drinking a alcohol  occasionally, but the family reports that he drinks more frequently than he admits and has history of falls. - CIWA protocols initiated with Ativan as needed  DVT prophylaxis: Lovenox. Advance Care Planning:   Code Status: Full Code   Consults: Cardiology  Family Communication: Family at the bedside  Severity of Illness: The appropriate patient status for this patient is OBSERVATION. Observation status is judged to be reasonable and necessary in order to provide the required intensity of service to ensure the patient's safety. The patient's presenting symptoms, physical exam findings, and initial radiographic and laboratory data in the context of their medical condition is felt to place them at decreased risk for further clinical deterioration. Furthermore, it is anticipated that the patient will be medically stable for discharge from the hospital within 2 midnights of admission.   Author: Lena Qualia, MD 11/23/2023 1:39 PM  For on call review [email or web address].

## 2023-11-23 NOTE — Consult Note (Signed)
 Cardiology Consultation   Patient ID: IRISH PIECH MRN: [id number]; DOB: 1945/07/10  Admit date: 11/22/2023 Date of Consult: 11/23/2023  PCP:  Trinda Fuller, FNP   [facility] Providers Cardiologist:  None        Patient Profile: Gabriel Everett is a 78 y.o. male with a hx of atrial flutter, hypertension, coronary atherosclerosis, aortic atherosclerosis who is being seen 11/23/2023 for the evaluation of tachy-arrhythmia at the request of Dr. Felipe Horton.  History of Present Illness: [name] first [facility] in [facility] for evaluation of arrhythmia felt to be atrial flutter. Patient had spontaneous conversion at that time and was referred to Dr. Nunzio Belch with EP. At that time, patient declined OAC and consideration for ablation. In November 2018, patient presented to the ED with palpitations and tachycardia, found to have recurrent atrial flutter vs afib. He was treated with Diltiazem  and started on OAC (initially Eliquis , switched to Xarelto ). In afib clinic follow ups later that November, patient appeared to remain in atrial flutter. Patient saw Dr. Nunzio Belch December 2018 at was again noted back in sinus rhythm. Per office note, patient agreed to ablation at that time. Unfortunately, appears patient was then lost to follow up with no ablation completed.   Patient presented to the ED on 6/5 reporting several months of dizziness and lightheadedness that began after a mechanical fall at home. Back in February, patient fell while outside feeding his neighbor's cats. 2 weeks ago, patient had a second fall. He reports waking up during the night feeling dizzy, ultimately fell after getting out of bed. In the ED, trauma imaging found no acute intracranial abnormality or skull fracture, no C spine fracture. Maxillofacial imaging with moderately depressed right and minimally depressed left nasal bone fracture.  CTA chest with solid pulmonary nodule 2mm in upper lobe along with aortic  atherosclerosis. Per notes, while being evaluated in the ED, patient had multiple episodes of tachycardia into the 140s, with spontaneous resolution. Initial labs noted hypokalemia, with K 3.4. CBC largely unremarkable. Troponin negative x2. Initial lactic acid level 7.0, trended to 3.9. Given elevated lactic acid, patient empirically started on broad spectrum abx with blood cultures drawn. A RUQ ultrasound has also been checked, notable for 6mm polyp in gallbladder and diffuse fatty infiltration of liver.   Given tachy-arrhythmia, patient started on Diltiazem  and cardiology consulted.    Past Medical History:  Diagnosis Date   Allergy    Seasonal   Atrial flutter with rapid ventricular response (HCC) 06/18/13   Dizziness 06/18/13   due to "inner ear infection"   Elevated blood pressure    elevated in the office 07/14/13 though he does not carry a diagnosis of HTN   History of colonic diverticulitis 2010   Right-sided.   PVC's (premature ventricular contractions)    By monitor 05/22/13    Past Surgical History:  Procedure Laterality Date   none       Home Medications:  Prior to Admission medications   Medication Sig Start Date End Date Taking? Authorizing Provider  cephALEXin (KEFLEX) 500 MG capsule Take 500 mg by mouth every 12 (twelve) hours as needed (for infections,allergies per patient). 10/24/23  Yes [provider]  ibuprofen  (ADVIL ,MOTRIN ) 400 MG tablet Take 1 tablet (400 mg total) by mouth every 6 (six) hours as needed. Patient taking differently: Take 400 mg by mouth every 6 (six) hours as needed for mild pain (pain score 1-3). 02/16/18  Yes Layden, Lindsey A, PA-C  doxycycline (  MONODOX) 100 MG capsule Take 100 mg by mouth 2 (two) times daily. Patient not taking: Reported on 11/23/2023 07/24/22   [provider]  methocarbamol  (ROBAXIN ) 500 MG tablet Take 1 tablet (500 mg total) by mouth 2 (two) times daily. Patient not taking: Reported on 08/09/2022 02/16/18    Layden, Lindsey A, PA-C  predniSONE (DELTASONE) 10 MG tablet as directed. Patient not taking: Reported on 11/23/2023 05/14/17   [provider]  Rivaroxaban  (XARELTO ) 15 MG TABS tablet Take 1 tablet (15 mg total) by mouth daily with supper. Patient not taking: Reported on 11/23/2023 05/17/17   Carroll, Donna C, NP    Scheduled Meds:  enoxaparin (LOVENOX) injection  40 mg Subcutaneous Q24H   Continuous Infusions:  ceFEPime (MAXIPIME) IV Stopped (11/23/23 1015)   diltiazem  (CARDIZEM ) infusion Stopped (11/23/23 1101)   metronidazole 500 mg (11/23/23 1423)   vancomycin     PRN Meds: acetaminophen **OR** acetaminophen, albuterol, ondansetron **OR** ondansetron (ZOFRAN) IV  Allergies:   No Known Allergies  Social History:   Social History   Socioeconomic History   Marital status: Married    Spouse name: Not on file   Number of children: Not on file   Years of education: Not on file   Highest education level: Not on file  Occupational History   Not on file  Tobacco Use   Smoking status: Never   Smokeless tobacco: Never  Substance and Sexual Activity   Alcohol  use: Yes    Alcohol /week: 21.0 - 28.0 standard drinks of alcohol     Types: 21 - 28 Cans of beer per week    Comment: 4-5 beers per day   Drug use: No   Sexual activity: Not on file    Comment: married  Other Topics Concern   Not on file  Social History Narrative   Lives in [place] with wife.  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   Social Drivers of Corporate investment banker Strain: Not on file  Food Insecurity: Not on file  Transportation Needs: Not on file  Physical Activity: Not on file  Stress: Not on file  Social Connections: Not on file  Intimate Partner Violence: Not on file    Family History:    Family History  Problem Relation Age of Onset   Cancer Mother 35       esophageal     ROS:  Please see the history of present illness.   All other ROS reviewed and  negative.     Physical Exam/Data: Vitals:   11/23/23 1000 11/23/23 1100 11/23/23 1307 11/23/23 1409  BP: (!) 140/59 128/79 (!) 153/85 (!) 153/95  Pulse: 66 64 78 86  Resp: 20 19 17    Temp:   98.6 F (37 C)   TempSrc:   Oral   SpO2: 100% 100% 100% 97%  Weight:   66.5 kg   Height:   5\' 10"  (1.778 m)     Intake/Output Summary (Last 24 hours) at 11/23/2023 1645 Last data filed at 11/23/2023 0203 Gross per 24 hour  Intake 1099.92 ml  Output --  Net 1099.92 ml      11/23/2023    1:07 PM 11/22/2023    6:05 PM 08/09/2022    2:18 PM  Last 3 Weights  Weight (lbs) 146 lb 8 oz 135 lb 163 lb 9.6 oz  Weight (kg) 66.452 kg 61.236 kg 74.208 kg     Body mass index is 21.02 kg/m.  General:  Well nourished, well developed, in no acute distress. Upper extremity tremor noted bilaterally. HEENT: normal Neck: no JVD Vascular: No carotid bruits; Distal pulses 2+ bilaterally Cardiac:  normal S1, S2; RRR; no murmur  Lungs:  clear to auscultation bilaterally, no wheezing, rhonchi or rales  Abd: soft, nontender, no hepatomegaly  Ext: no edema Musculoskeletal:  No deformities, BUE and BLE strength normal and equal Skin: warm and dry  Neuro:  CNs 2-12 intact, no focal abnormalities noted Psych:  Normal affect   EKG:  Multiple EKG tracings reviewed. Tachycardic ECGs appear consistent with 2:1 atrial flutter.  Telemetry:  Telemetry was personally reviewed. Very variable HR noted while at [facility] with ventricular rates up to 200bpm. Since starting Diltiazem , has had quiescence of this tachy-arrhythmia.   Relevant CV Studies: 2014 TTE  Study Conclusions   Left ventricle: The cavity size was normal. Systolic  function was normal. The estimated ejection fraction was in  the range of 55% to 60%. Wall motion was normal; there were  no regional wall motion abnormalities. Left ventricular  diastolic function parameters were normal.  Transthoracic echocardiography.  M-mode, complete 2D,  spectral Doppler, and  color Doppler.  Height:  Height:  177.8cm. Height: 70in.  Weight:  Weight: 79.8kg. Weight:  175.6lb.  Body mass index:  BMI: 25.3kg/m^2.  Body surface  area:    BSA: 1.37m^2.  Blood pressure:     162/90.  Patient  status:  Outpatient.  Location:  Echo laboratory.   ------------------------------------------------------------   ------------------------------------------------------------  Left ventricle:  The cavity size was normal. Systolic  function was normal. The estimated ejection fraction was in  the range of 55% to 60%. Wall motion was normal; there were  no regional wall motion abnormalities. The transmitral flow  pattern was normal. The deceleration time of the early  transmitral flow velocity was normal. The pulmonary vein  flow pattern was normal. The tissue Doppler parameters were  normal. Left ventricular diastolic function parameters were  normal.   ------------------------------------------------------------  Aortic valve:   Trileaflet; normal thickness leaflets.  Mobility was not restricted.  Doppler:  Transvalvular  velocity was within the normal range. There was no stenosis.   No regurgitation.    VTI ratio of LVOT to aortic valve:  0.72. Peak velocity ratio of LVOT to aortic valve: 0.77.  Mean gradient: 4mm Hg (S).   ------------------------------------------------------------  Aorta: Aortic root: The aortic root was normal in size.   ------------------------------------------------------------  Mitral valve:   Structurally normal valve.   Mobility was  not restricted.  Doppler:  Transvalvular velocity was within  the normal range. There was no evidence for stenosis.  No  regurgitation.    Valve area by pressure half-time:  3.01cm^2. Indexed valve area by pressure half-time:  1.52cm^2/m^2.    Mean gradient: 1mm Hg (D).   ------------------------------------------------------------  Left atrium:  LA Volume/ BSA = 21.2 ml/m2 The atrium was  normal in size.    ------------------------------------------------------------  Right ventricle:  The cavity size was normal. Wall thickness  was normal. Systolic function was normal.   ------------------------------------------------------------  Pulmonic valve:    Doppler:  Transvalvular velocity was  within the normal range. There was no evidence for stenosis.    ------------------------------------------------------------  Tricuspid valve:   Structurally normal valve.    Doppler:  Transvalvular velocity was within the normal range.  No  regurgitation.   ------------------------------------------------------------  Pulmonary artery:   The main pulmonary artery was  normal-sized. Systolic pressure was within the normal range.    ------------------------------------------------------------  Right atrium:  The atrium was normal in size.   ------------------------------------------------------------  Pericardium: There was no pericardial effusion.   ------------------------------------------------------------  Systemic veins:  Inferior vena cava: The vessel was normal in size.   Laboratory Data: High Sensitivity Troponin:  No results for input(s): "TROPONINIHS" in the last 720 hours.   Chemistry Recent Labs  Lab 11/22/23 1902 11/22/23 2209 11/23/23 1520  NA 142 141  --   K 3.4* 3.3*  --   CL 103  --   --   CO2 16*  --   --   GLUCOSE 81  --   --   BUN 8  --   --   CREATININE 0.77  --   --   CALCIUM 9.4  --   --   MG  --   --  1.3*  GFRNONAA >60  --   --   ANIONGAP 24*  --   --     Recent Labs  Lab 11/22/23 1902  PROT 6.3*  ALBUMIN 3.6  AST 109*  ALT 61*  ALKPHOS 54  BILITOT 1.1   Lipids No results for input(s): "CHOL", "TRIG", "HDL", "LABVLDL", "LDLCALC", "CHOLHDL" in the last 168 hours.  Hematology Recent Labs  Lab 11/22/23 1902 11/22/23 2209  WBC 3.6*  --   RBC 3.80*  --   HGB 13.6 13.3  HCT 38.1* 39.0  MCV 100.3*  --   MCH 35.8*  --   MCHC 35.7  --   RDW 13.5   --   PLT 84*  --    Thyroid  Recent Labs  Lab 11/22/23 1926  TSH 2.290    BNPNo results for input(s): "BNP", "PROBNP" in the last 168 hours.  DDimer  Recent Labs  Lab 11/22/23 2143  DDIMER 1.53*    Radiology/Studies:  US  Abdomen Limited RUQ (LIVER/GB) Result Date: 11/23/2023 CLINICAL DATA:  Transaminitis EXAM: ULTRASOUND ABDOMEN LIMITED RIGHT UPPER QUADRANT COMPARISON:  CT abdomen and pelvis 12/13/2009 FINDINGS: Gallbladder: Non dependent nonshadowing filling defect along the lateral gallbladder wall measuring 6 mm consistent with gallbladder polyp. Based on size criteria, this is likely benign and no imaging follow-up is indicated. No stones identified. No gallbladder wall thickening or edema. Murphy's sign is negative. Common bile duct: Diameter: 3 mm, normal Liver: Diffusely increased hepatic parenchymal echotexture suggesting diffuse fatty infiltration. No focal lesions are identified. Portal vein is patent on color Doppler imaging with normal direction of blood flow towards the liver. Other: None. IMPRESSION: 1. 6 mm polyp in the gallbladder, likely benign. 2. No evidence of cholelithiasis or acute cholecystitis. 3. Increased hepatic parenchymal echotexture suggesting diffuse fatty infiltration. Electronically Signed   By: Boyce Byes M.D.   On: 11/23/2023 00:28   CT Angio Chest PE W and/or Wo Contrast Result Date: 11/22/2023 CLINICAL DATA:  Elevated D-dimer EXAM: CT ANGIOGRAPHY CHEST WITH CONTRAST TECHNIQUE: Multidetector CT imaging of the chest was performed using the standard protocol during bolus administration of intravenous contrast. Multiplanar CT image reconstructions and MIPs were obtained to evaluate the vascular anatomy. RADIATION DOSE REDUCTION: This exam was performed according to the departmental dose-optimization program which includes automated exposure control, adjustment of the mA and/or kV according to patient size and/or use of iterative reconstruction technique.  CONTRAST:  75mL OMNIPAQUE IOHEXOL 350 MG/ML SOLN COMPARISON:  CT chest 09/07/2022 FINDINGS: Cardiovascular: Satisfactory opacification of the pulmonary arteries to the segmental level. No evidence of pulmonary embolism. Normal heart size. No pericardial effusion. There are atherosclerotic calcifications of the aorta. Mediastinum/Nodes:  There are prominent right hilar and subcarinal lymph nodes. There is enlarged subcarinal lymph node measuring 10 mm. Visualized esophagus and thyroid gland are within normal limits. Lungs/Pleura: There is lingular atelectasis. The lungs are otherwise clear. There is a 2 mm nodule in the left upper lobe image 6/58. Upper Abdomen: No acute abnormality. Musculoskeletal: No chest wall abnormality. No acute or significant osseous findings. Review of the MIP images confirms the above findings. IMPRESSION: 1. No evidence for pulmonary embolism. 2. Lingular atelectasis. 3. 2 mm left solid pulmonary nodule within the upper lobe. No follow-up needed if patient is low-risk.This recommendation follows the consensus statement: Guidelines for Management of Incidental Pulmonary Nodules Detected on CT Images: From the Fleischner Society 2017; Radiology 2017; 284:228-243. 4. Prominent right hilar and subcarinal lymph nodes, nonspecific. 5. Aortic atherosclerosis. Aortic Atherosclerosis (ICD10-I70.0). Electronically Signed   By: Tyron Gallon M.D.   On: 11/22/2023 23:10   CT Maxillofacial Wo Contrast Result Date: 11/22/2023 CLINICAL DATA:  Blunt facial trauma. Fall over a week ago. Patient reports lump on his nose. EXAM: CT MAXILLOFACIAL WITHOUT CONTRAST TECHNIQUE: Multidetector CT imaging of the maxillofacial structures was performed. Multiplanar CT image reconstructions were also generated. RADIATION DOSE REDUCTION: This exam was performed according to the departmental dose-optimization program which includes automated exposure control, adjustment of the mA and/or kV according to patient size  and/or use of iterative reconstruction technique. COMPARISON:  None Available. FINDINGS: Osseous: Moderately depressed right minimally depressed left nasal bone fracture. Mild rightward nasal septal bowing. No acute fracture of the zygomatic arches, mandibles, maxilla or pterygoid plates. Orbits: No orbital fracture.  No evidence of globe injury. Sinuses: No sinus fracture or hemosinus. Minor mucosal thickening of the maxillary sinuses. No mastoid effusion. Soft tissues: Nasal soft tissue thickening. Limited intracranial: Assessed on head CT performed concurrently. IMPRESSION: Moderately depressed right and minimally depressed left nasal bone fracture. Electronically Signed   By: Chadwick Colonel M.D.   On: 11/22/2023 19:45   CT Cervical Spine Wo Contrast Result Date: 11/22/2023 CLINICAL DATA:  Neck trauma (Age >= 65y) Fall 2 x 2 over a week ago.  Dizziness. EXAM: CT CERVICAL SPINE WITHOUT CONTRAST TECHNIQUE: Multidetector CT imaging of the cervical spine was performed without intravenous contrast. Multiplanar CT image reconstructions were also generated. RADIATION DOSE REDUCTION: This exam was performed according to the departmental dose-optimization program which includes automated exposure control, adjustment of the mA and/or kV according to patient size and/or use of iterative reconstruction technique. COMPARISON:  CT cervical spine 02/16/2018 FINDINGS: Alignment: Normal. Skull base and vertebrae: No acute fracture. Vertebral body heights are maintained. The dens and skull base are intact. Chronic degeneration at C1-C2. Soft tissues and spinal canal: No prevertebral fluid or swelling. No visible canal hematoma. Disc levels: Multilevel degenerative disc disease is most prominent at C6-C7. There is mild multilevel facet hypertrophy. Upper chest: No acute findings. Other: Carotid calcifications. IMPRESSION: 1. No acute fracture or subluxation of the cervical spine. 2. Multilevel degenerative disc disease and  facet hypertrophy. Electronically Signed   By: Chadwick Colonel M.D.   On: 11/22/2023 19:41   CT Head Wo Contrast Result Date: 11/22/2023 CLINICAL DATA:  Head trauma, moderate-severe Two falls over a week ago.  Dizziness. EXAM: CT HEAD WITHOUT CONTRAST TECHNIQUE: Contiguous axial images were obtained from the base of the skull through the vertex without intravenous contrast. RADIATION DOSE REDUCTION: This exam was performed according to the departmental dose-optimization program which includes automated exposure control, adjustment of the mA and/or kV according  to patient size and/or use of iterative reconstruction technique. COMPARISON:  None Available. FINDINGS: Brain: No intracranial hemorrhage, mass effect, or midline shift. Age related atrophy. No hydrocephalus. The basilar cisterns are patent. Mild periventricular and deep white matter hypodensity typical of chronic small vessel ischemia. No evidence of territorial infarct or acute ischemia. No extra-axial or intracranial fluid collection. Vascular: No hyperdense vessel or unexpected calcification. Skull: No fracture or focal lesion. Sinuses/Orbits: Assessed on concurrent face CT, reported separately. Other: No confluent scalp hematoma. IMPRESSION: 1. No acute intracranial abnormality. No skull fracture. 2. Age related atrophy and chronic small vessel ischemia. Electronically Signed   By: Chadwick Colonel M.D.   On: 11/22/2023 19:38     Assessment and Plan:  Atrial flutter Patient with history of atrial flutter (lost to follow up since 2018), presented with episodic dizziness and 2 falls in the last few months. Has not taken rate control medication or OAC in years. While in the ED, noted with paroxysmal tachycardia by ED provider. Telemetry since his arrival personally reviewed. Patient initially with very variable HR noted while at [facility] with ventricular rates up to 200bpm. Since starting Diltiazem , has had quiescence of this tachy-arrhythmia. Overall  impression of patient's arrhythmia is that this is most likely recurrent and paroxysmal atrial flutter with variable conduction. There may also be some coarse afib. Cannot definitively rule out SVT but with history of atrial flutter, I feel SVT much less likely. Patient reports consuming up to 3 alcoholic beverages per day. Would estimate weekly consumption of at least 28 beverages. Discussed with patient the significant role alcohol  can play in provoking arrhythmias.  Patient's CHADs2VASC score is 4. However, given recent falls and alcohol  consumption, would worry about potential for bleeding complications. Favor deferring OAC at this time, will discuss with attending provider. Given lab abnormalities and suspicion of SIRS, reasonable to continue with easily titratable rate control. When clinical suspicion of acute decompensation is low, would convert to PO Cardizem  CD 240mg . Check echocardiogram  Hypertension Patient notably hypertensive this admission. With normal renal function, consider adding ACEi/ARB.  Coronary and aortic atherosclerosis Noted on previous high res chest CT as well as CTA chest this admission.  Check lipid panel and consider statin initiation pending stabilization of LFTs.  Per primary team: SIRS  Risk Assessment/Risk Scores:         CHA2DS2-VASc Score = 4   This indicates a 4.8% annual risk of stroke. The patient's score is based upon: CHF History: 0 HTN History: 1 Diabetes History: 0 Stroke History: 0 Vascular Disease History: 1 Age Score: 2 Gender Score: 0        For questions or updates, please contact [facility] Please consult [email or web address] for contact info under    Signed, Kaspian Muccio, PA-C  11/23/2023 4:45 PM

## 2023-11-23 NOTE — Progress Notes (Signed)
 Pharmacy Antibiotic Note  Gabriel Everett is a 78 y.o. male admitted on 11/22/2023 with sepsis.  Pharmacy has been consulted for vancomycin dosing. Pt is afebrile and WBC is slightly low at 3.6. Lactic acid is down to 3.9 from 7. Pt received first doses overnight.   Plan: Vancomycin 1250mg  IV Q24H F/u renal fxn, C&S, clinical status and peak/trough at [facility] Narrow as able  Height: 5\' 10"  (177.8 cm) Weight: 61.2 kg (135 lb) IBW/kg (Calculated) : 73  Temp (24hrs), Avg:98.3 F (36.8 C), Min:98 F (36.7 C), Max:98.5 F (36.9 C)  Recent Labs  Lab 11/22/23 1902 11/22/23 2226 11/23/23 0004  WBC 3.6*  --   --   CREATININE 0.77  --   --   LATICACIDVEN  --  7.0* 3.9*    Estimated Creatinine Clearance: 63.8 mL/min (by C-G formula based on SCr of 0.77 mg/dL).    No Known Allergies  Antimicrobials this admission: Vanc 6/6>> Cefepime 6/6>> Flagyl 6/6>>  Dose adjustments this admission: N/A  Microbiology results: Pending  Thank you for allowing pharmacy to be a part of this patient's care.  Meribeth Vitug, Kathlene Paradise 11/23/2023 8:52 AM

## 2023-11-23 NOTE — Plan of Care (Signed)
 Plan of Care Note for accepted transfer   Patient name: LADARRION TELFAIR ZOX:[id number] DOB: 1946-06-08  Facility requesting transfer: [facility] ED Requesting Provider: Dr. Tamela Fake Facility course: 78 year old male with history of atrial flutter on Xarelto , PVCs presented to the ED complaining of lightheadedness/dizziness over the last 2 weeks intermittently since after a mechanical fall where he tripped and fell and struck his head and face against a cat food bowl.  In the ED, patient was intermittently tachycardic to the 150s and noted to be in SVT versus A-fib/flutter.  EDP discussed with cardiology and patient was started on Cardizem  drip. Labs showing WBC count 3.6, platelet count 84k, potassium 3.4, bicarb 16, glucose 81, creatinine 0.7, AST 109, ALT 61, alk phos and T. bili normal, troponin negative x 2, TSH normal, UDS negative, UA without signs of infection, lactate 7.0> 3.9, salicylate level <7.0, ethanol level <15. CT angiogram chest negative for PE.  CT maxillofacial showing moderately depressed right and minimally depressed left nasal bone fracture.  CT head/C-spine negative for acute findings.   Right upper quadrant abdominal ultrasound showing 6 mm polyp in the gallbladder but no evidence of cholelithiasis or acute cholecystitis.  Showing diffuse fatty infiltration of the liver.  Patient was given Ativan, vancomycin, cefepime, metronidazole, 1 L IV fluids.  Plan of care: The patient is accepted for admission to Progressive unit at [facility].  [facility] will assume care on arrival to accepting facility. Until arrival, care as per EDP. However, [facility] available 24/7 for questions and assistance.  Check [email or web address] for on-call coverage.  Nursing staff, please call [facility] Admits & Consults System-Wide number under Amion on patient's arrival so appropriate admitting provider can evaluate the pt.

## 2023-11-23 NOTE — ED Notes (Signed)
 Infnity wu

## 2023-11-24 ENCOUNTER — Observation Stay (HOSPITAL_COMMUNITY)

## 2023-11-24 DIAGNOSIS — I471 Supraventricular tachycardia, unspecified: Secondary | ICD-10-CM

## 2023-11-24 DIAGNOSIS — E872 Acidosis, unspecified: Secondary | ICD-10-CM

## 2023-11-24 DIAGNOSIS — G9349 Other encephalopathy: Secondary | ICD-10-CM | POA: Diagnosis not present

## 2023-11-24 DIAGNOSIS — I480 Paroxysmal atrial fibrillation: Secondary | ICD-10-CM

## 2023-11-24 DIAGNOSIS — E874 Mixed disorder of acid-base balance: Secondary | ICD-10-CM | POA: Diagnosis not present

## 2023-11-24 DIAGNOSIS — I080 Rheumatic disorders of both mitral and aortic valves: Secondary | ICD-10-CM | POA: Diagnosis not present

## 2023-11-24 DIAGNOSIS — S022XXA Fracture of nasal bones, initial encounter for closed fracture: Secondary | ICD-10-CM | POA: Diagnosis not present

## 2023-11-24 DIAGNOSIS — R296 Repeated falls: Secondary | ICD-10-CM | POA: Diagnosis not present

## 2023-11-24 DIAGNOSIS — I4892 Unspecified atrial flutter: Secondary | ICD-10-CM | POA: Diagnosis not present

## 2023-11-24 DIAGNOSIS — K760 Fatty (change of) liver, not elsewhere classified: Secondary | ICD-10-CM | POA: Diagnosis not present

## 2023-11-24 DIAGNOSIS — Z9181 History of falling: Secondary | ICD-10-CM | POA: Diagnosis not present

## 2023-11-24 DIAGNOSIS — I5030 Unspecified diastolic (congestive) heart failure: Secondary | ICD-10-CM | POA: Diagnosis not present

## 2023-11-24 DIAGNOSIS — R9431 Abnormal electrocardiogram [ECG] [EKG]: Secondary | ICD-10-CM | POA: Diagnosis not present

## 2023-11-24 DIAGNOSIS — I2510 Atherosclerotic heart disease of native coronary artery without angina pectoris: Secondary | ICD-10-CM | POA: Diagnosis not present

## 2023-11-24 DIAGNOSIS — E860 Dehydration: Secondary | ICD-10-CM | POA: Diagnosis not present

## 2023-11-24 DIAGNOSIS — F10931 Alcohol use, unspecified with withdrawal delirium: Secondary | ICD-10-CM | POA: Diagnosis not present

## 2023-11-24 DIAGNOSIS — I700 Atherosclerosis of aorta: Secondary | ICD-10-CM | POA: Diagnosis not present

## 2023-11-24 DIAGNOSIS — R000 Tachycardia, unspecified: Secondary | ICD-10-CM

## 2023-11-24 DIAGNOSIS — R6511 Systemic inflammatory response syndrome (SIRS) of non-infectious origin with acute organ dysfunction: Secondary | ICD-10-CM | POA: Diagnosis not present

## 2023-11-24 DIAGNOSIS — W010XXA Fall on same level from slipping, tripping and stumbling without subsequent striking against object, initial encounter: Secondary | ICD-10-CM | POA: Diagnosis not present

## 2023-11-24 DIAGNOSIS — I4891 Unspecified atrial fibrillation: Secondary | ICD-10-CM | POA: Diagnosis not present

## 2023-11-24 DIAGNOSIS — I10 Essential (primary) hypertension: Secondary | ICD-10-CM | POA: Diagnosis not present

## 2023-11-24 DIAGNOSIS — K824 Cholesterolosis of gallbladder: Secondary | ICD-10-CM | POA: Diagnosis not present

## 2023-11-24 DIAGNOSIS — D6959 Other secondary thrombocytopenia: Secondary | ICD-10-CM | POA: Diagnosis not present

## 2023-11-24 DIAGNOSIS — I517 Cardiomegaly: Secondary | ICD-10-CM | POA: Diagnosis not present

## 2023-11-24 DIAGNOSIS — R6510 Systemic inflammatory response syndrome (SIRS) of non-infectious origin without acute organ dysfunction: Secondary | ICD-10-CM | POA: Diagnosis not present

## 2023-11-24 DIAGNOSIS — E876 Hypokalemia: Secondary | ICD-10-CM | POA: Diagnosis not present

## 2023-11-24 DIAGNOSIS — Y92009 Unspecified place in unspecified non-institutional (private) residence as the place of occurrence of the external cause: Secondary | ICD-10-CM | POA: Diagnosis not present

## 2023-11-24 LAB — MAGNESIUM: Magnesium: 1.8 mg/dL (ref 1.7–2.4)

## 2023-11-24 LAB — ECHOCARDIOGRAM COMPLETE
AR max vel: 4.29 cm2
AV Area VTI: 4.21 cm2
AV Area mean vel: 4.13 cm2
AV Mean grad: 2 mmHg
AV Peak grad: 2.8 mmHg
Ao pk vel: 0.83 m/s
Area-P 1/2: 3.42 cm2
Height: 70 in
S' Lateral: 3.3 cm
Weight: 2344 [oz_av]

## 2023-11-24 LAB — BASIC METABOLIC PANEL WITH GFR
Anion gap: 13 (ref 5–15)
BUN: 6 mg/dL — ABNORMAL LOW (ref 8–23)
CO2: 17 mmol/L — ABNORMAL LOW (ref 22–32)
Calcium: 7.8 mg/dL — ABNORMAL LOW (ref 8.9–10.3)
Chloride: 106 mmol/L (ref 98–111)
Creatinine, Ser: 1.05 mg/dL (ref 0.61–1.24)
GFR, Estimated: >60 mL/min (ref >60)
Glucose, Bld: 120 mg/dL — ABNORMAL HIGH (ref 70–99)
Potassium: 3.2 mmol/L — ABNORMAL LOW (ref 3.5–5.1)
Sodium: 136 mmol/L (ref 135–145)

## 2023-11-24 LAB — LIPID PANEL
Cholesterol: 153 mg/dL (ref 0–200)
HDL: 52 mg/dL (ref >40)
LDL Cholesterol: 78 mg/dL (ref 0–99)
Total CHOL/HDL Ratio: 2.9 ratio
Triglycerides: 114 mg/dL (ref <150)
VLDL: 23 mg/dL (ref 0–40)

## 2023-11-24 LAB — CBC
HCT: 35 % — ABNORMAL LOW (ref 39.0–52.0)
Hemoglobin: 12.4 g/dL — ABNORMAL LOW (ref 13.0–17.0)
MCH: 36.2 pg — ABNORMAL HIGH (ref 26.0–34.0)
MCHC: 35.4 g/dL (ref 30.0–36.0)
MCV: 102 fL — ABNORMAL HIGH (ref 80.0–100.0)
Platelets: 114 10*3/uL — ABNORMAL LOW (ref 150–400)
RBC: 3.43 MIL/uL — ABNORMAL LOW (ref 4.22–5.81)
RDW: 13.7 % (ref 11.5–15.5)
WBC: 7 10*3/uL (ref 4.0–10.5)
nRBC: 0 % (ref 0.0–0.2)

## 2023-11-24 MED ORDER — DEXTROSE-SODIUM CHLORIDE 5-0.9 % IV SOLN: INTRAVENOUS | Status: AC

## 2023-11-24 MED ORDER — SODIUM CHLORIDE 0.9 % IV SOLN
2.0000 g | Freq: Two times a day (BID) | INTRAVENOUS | Status: DC
  Administered 2023-11-24 – 2023-11-25 (×2): 2 g via INTRAVENOUS
  Filled 2023-11-24 (×2): qty 12.5

## 2023-11-24 MED ORDER — DIPHENHYDRAMINE HCL 50 MG/ML IJ SOLN
25.0000 mg | Freq: Once | INTRAMUSCULAR | Status: AC
  Administered 2023-11-24: 25 mg via INTRAVENOUS
  Filled 2023-11-24: qty 1

## 2023-11-24 MED ORDER — PHENOBARBITAL SODIUM 130 MG/ML IJ SOLN
130.0000 mg | Freq: Once | INTRAMUSCULAR | Status: AC
  Administered 2023-11-24: 130 mg via INTRAVENOUS
  Filled 2023-11-24: qty 1

## 2023-11-24 MED ORDER — POTASSIUM CHLORIDE CRYS ER 20 MEQ PO TBCR
40.0000 meq | EXTENDED_RELEASE_TABLET | Freq: Once | ORAL | Status: AC
  Administered 2023-11-24: 40 meq via ORAL
  Filled 2023-11-24: qty 2

## 2023-11-24 MED ORDER — HALOPERIDOL LACTATE 5 MG/ML IJ SOLN
1.0000 mg | Freq: Four times a day (QID) | INTRAMUSCULAR | Status: DC | PRN
  Administered 2023-11-24: 2 mg via INTRAMUSCULAR
  Filled 2023-11-24: qty 1

## 2023-11-24 NOTE — Care Management Obs Status (Signed)
 MEDICARE OBSERVATION STATUS NOTIFICATION   Patient Details  Name: Gabriel Everett MRN: [id number] Date of Birth: 03-16-1946   Medicare Observation Status Notification Given:  Yes    Ileigh Mettler G., RN 11/24/2023, 9:06 AM

## 2023-11-24 NOTE — Progress Notes (Signed)
 Echocardiogram 2D Echocardiogram has been performed.  Gabriel Everett N Yadira Hada,RDCS 11/24/2023, 3:11 PM

## 2023-11-24 NOTE — Plan of Care (Signed)
   Problem: Coping: Goal: Level of anxiety will decrease Outcome: Progressing

## 2023-11-24 NOTE — Progress Notes (Signed)
 IV Team at bedside for vascular consult, PIV placed by unit nurse. No further interventions needed from IV Team.

## 2023-11-24 NOTE — Progress Notes (Addendum)
 PROGRESS NOTE  Gabriel Everett ZOX:[id number] DOB: 1945/11/02 DOA: 11/22/2023 PCP: Trinda Fuller, FNP   LOS: 0 days   Brief Narrative / Interim history: 78 year old male with history of PAF previously on Xarelto , PVCs, is brought to the hospital by family for lightheadedness, dizziness, several falls.  He was found to be significantly tachycardic SVT versus A-fib/flutter with RVR and was admitted to the hospital.  Cardiology was consulted.  In the ED was also noted to be tachypneic, have leukopenia and had an elevated lactic acid.  Cultures were obtained and he was started on antibiotics  Subjective / 24h Interval events: Increased confusion overnight, requiring restraints and sitter at bedside this morning.  He is waking up when prompted, mumbling, appears very confused unable to give me straight answers  Assesement and Plan: Active Problems:   SIRS (systemic inflammatory response syndrome) (HCC)   Atrial flutter (HCC)   Frequent falls   Essential hypertension   Hypokalemia   Hypomagnesemia   Alcohol  use  Principal problem Paroxysmal A-flutter versus SVT-cardiology consulted and following.  He was placed on diltiazem  infusion with improvement in his rates, now on diltiazem  p.o. this morning.  2D echocardiogram has been ordered and is pending - Per cardiology, patient declined anticoagulation  Active problems SIRS-he had tachypnea, tachycardia, leukopenia and elevated lactic acid on admission.  Unclear etiology, ?  A degree of dehydration and poor p.o. intake given ketonuria.  Has received fluids, continue given increased delirium and poor p.o. intake - Cultures were obtained, if negative by tomorrow we will discontinue all antibiotics as my suspicion for infection is low  EtOH use, with withdrawals, delirium -with concern for developing DTs.  I spoke with grandson and he reports that patient drinks about couple of beers a day, every day.  Patient does live by himself however and in  the H&P there was some concern about him drinking more - Continue CIWA, he is very disoriented, requiring restraints and sitter.  On the day of admission he was alert and oriented x 4  Recurrent falls-possibly related to his tachycardia versus ongoing EtOH use.  Obtain PT evaluation when he is more alert  Essential hypertension-continue diltiazem , blood pressure stable  Hypokalemia, hypomagnesemia-continue to monitor and replace as indicated  Gallbladder polyp-incidental, should be followed up as an outpatient  Thrombocytopenia-monitor, due to EtOH use  Scheduled Meds:  diltiazem   240 mg Oral Daily   enoxaparin  (LOVENOX ) injection  40 mg Subcutaneous Q24H   folic acid   1 mg Oral Daily   multivitamin with minerals  1 tablet Oral Daily   thiamine   100 mg Oral Daily   Or   thiamine   100 mg Intravenous Daily   Continuous Infusions:  ceFEPime  (MAXIPIME ) IV Stopped (11/24/23 0239)   metronidazole  Stopped (11/24/23 0349)   vancomycin  Stopped (11/24/23 0334)   PRN Meds:.acetaminophen  **OR** acetaminophen , albuterol , fentaNYL  (SUBLIMAZE ) injection, haloperidol lactate, LORazepam  **OR** LORazepam , ondansetron  **OR** ondansetron  (ZOFRAN ) IV  Current Outpatient Medications  Medication Instructions   cephALEXin (KEFLEX) 500 mg, Oral, Every 12 hours PRN   doxycycline (MONODOX) 100 mg, 2 times daily   ibuprofen  (ADVIL ) 400 mg, Oral, Every 6 hours PRN   methocarbamol  (ROBAXIN ) 500 mg, Oral, 2 times daily   predniSONE (DELTASONE) 10 MG tablet As directed   Rivaroxaban  (XARELTO ) 15 mg, Oral, Daily with supper    Diet Orders (From admission, onward)     Start     Ordered   11/23/23 1400  Diet Heart Room service appropriate? Yes; Fluid consistency:  Thin  Diet effective now       Question Answer Comment  Room service appropriate? Yes   Fluid consistency: Thin      11/23/23 1402            DVT prophylaxis: enoxaparin  (LOVENOX ) injection 40 mg Start: 11/23/23 1500   Lab Results   Component Value Date   PLT 114 (L) 11/24/2023      Code Status: Full Code  Family Communication: Grandson at bedside  Status is: Observation The patient will require care spanning > 2 midnights and should be moved to inpatient because: Increased confusion, alcohol  induced withdrawals with delirium   Level of care: Progressive  Consultants:  Cardiology  Objective: Vitals:   11/24/23 0325 11/24/23 0410 11/24/23 0716 11/24/23 0924  BP: 137/63 123/73 123/81 134/81  Pulse:  91    Resp:  20 20   Temp:  98.7 F (37.1 C) 97.9 F (36.6 C)   TempSrc:  Axillary Oral   SpO2:  98%    Weight:      Height:        Intake/Output Summary (Last 24 hours) at 11/24/2023 1010 Last data filed at 11/24/2023 1610 Gross per 24 hour  Intake 858.35 ml  Output 400 ml  Net 458.35 ml   Wt Readings from Last 3 Encounters:  11/23/23 66.5 kg  08/09/22 74.2 kg  06/18/17 73.9 kg    Examination:  Constitutional: Restless, confused, trying to get out of bed Eyes: no scleral icterus ENMT: Mucous membranes are moist.  Neck: normal, supple Respiratory: clear to auscultation bilaterally, no wheezing, no crackles. Normal respiratory effort. No accessory muscle use.  Cardiovascular: Regular, tachycardic at times.  No peripheral edema Abdomen: non distended, no tenderness. Bowel sounds positive.  Musculoskeletal: no clubbing / cyanosis.    Data Reviewed: I have independently reviewed following labs and imaging studies   CBC Recent Labs  Lab 11/22/23 1902 11/22/23 2209 11/24/23 0355  WBC 3.6*  --  7.0  HGB 13.6 13.3 12.4*  HCT 38.1* 39.0 35.0*  PLT 84*  --  114*  MCV 100.3*  --  102.0*  MCH 35.8*  --  36.2*  MCHC 35.7  --  35.4  RDW 13.5  --  13.7    Recent Labs  Lab 11/22/23 1902 11/22/23 1926 11/22/23 2143 11/22/23 2209 11/22/23 2226 11/23/23 0004 11/23/23 1520 11/24/23 0355  NA 142  --   --  141  --   --   --  136  K 3.4*  --   --  3.3*  --   --   --  3.2*  CL 103  --   --    --   --   --   --  106  CO2 16*  --   --   --   --   --   --  17*  GLUCOSE 81  --   --   --   --   --   --  120*  BUN 8  --   --   --   --   --   --  6*  CREATININE 0.77  --   --   --   --   --   --  1.05  CALCIUM 9.4  --   --   --   --   --   --  7.8*  AST 109*  --   --   --   --   --   --   --  ALT 61*  --   --   --   --   --   --   --   ALKPHOS 54  --   --   --   --   --   --   --   BILITOT 1.1  --   --   --   --   --   --   --   ALBUMIN 3.6  --   --   --   --   --   --   --   MG  --   --   --   --   --   --  1.3*  --   CRP  --   --   --   --   --   --  0.6  --   DDIMER  --   --  1.53*  --   --   --   --   --   LATICACIDVEN  --   --   --   --  7.0* 3.9*  --   --   INR  --   --   --   --   --  1.0  --   --   TSH  --  2.290  --   --   --   --   --   --   HGBA1C  --   --   --   --   --   --  4.6*  --     ------------------------------------------------------------------------------------------------------------------ Recent Labs    11/24/23 0355  CHOL 153  HDL 52  LDLCALC 78  TRIG 114  CHOLHDL 2.9    Lab Results  Component Value Date   HGBA1C 4.6 (L) 11/23/2023   ------------------------------------------------------------------------------------------------------------------ Recent Labs    11/22/23 1926  TSH 2.290    Cardiac Enzymes No results for input(s): "CKMB", "TROPONINI", "MYOGLOBIN" in the last 168 hours.  Invalid input(s): "CK" ------------------------------------------------------------------------------------------------------------------ No results found for: "BNP"  CBG: No results for input(s): "GLUCAP" in the last 168 hours.  Recent Results (from the past 240 hours)  Blood culture (routine x 2)     Status: None (Preliminary result)   Collection Time: 11/23/23 12:05 AM   Specimen: BLOOD LEFT FOREARM  Result Value Ref Range Status   Specimen Description   Final    BLOOD LEFT FOREARM Performed at [facility], [address]  [place], [place] [address]    Special Requests   Final    BOTTLES DRAWN AEROBIC AND ANAEROBIC Blood Culture adequate volume Performed at [facility], [address] [place], [place] [address]    Culture   Final    NO GROWTH 1 DAY Performed at [facility], [address], [place], [place] [address]    Report Status PENDING  Incomplete  Blood culture (routine x 2)     Status: None (Preliminary result)   Collection Time: 11/23/23 12:23 AM   Specimen: BLOOD  Result Value Ref Range Status   Specimen Description   Final    BLOOD RIGHT ANTECUBITAL Performed at [facility], [address], [place], [place] [address]    Special Requests   Final    BOTTLES DRAWN AEROBIC AND ANAEROBIC Blood Culture adequate volume Performed at [facility], [address], [place], [place] [address]    Culture   Final    NO GROWTH < 24 HOURS Performed at [facility], [address], [place], [place] [address]    Report Status PENDING  Incomplete     Radiology Studies: No results found.   Kathlen Para, MD, PhD [facility]  Between 7 am - 7 pm I am available, please contact me via Amion (for emergencies) or Securechat (non urgent messages)  Between 7 pm - 7 am I am not available, please contact night coverage MD/APP via Amion

## 2023-11-24 NOTE — Plan of Care (Signed)
  Problem: Clinical Measurements: Goal: Ability to maintain clinical measurements within normal limits will improve Outcome: Progressing Goal: Will remain free from infection Outcome: Progressing Goal: Diagnostic test results will improve Outcome: Progressing Goal: Cardiovascular complication will be avoided Outcome: Progressing   

## 2023-11-24 NOTE — Progress Notes (Signed)
 Patient calm and following instructions.  Soft waist restriant removed.

## 2023-11-24 NOTE — Progress Notes (Signed)
 Cardiologist:  Sheryle Donning  Subjective:   Sedated DT;s earlier with confusion and agitation Spoke with family   Objective:  Vitals:   11/24/23 0325 11/24/23 0410 11/24/23 0716 11/24/23 0924  BP: 137/63 123/73 123/81 134/81  Pulse:  91    Resp:  20 20   Temp:  98.7 F (37.1 C) 97.9 F (36.6 C)   TempSrc:  Axillary Oral   SpO2:  98%    Weight:      Height:        Intake/Output from previous day:  Intake/Output Summary (Last 24 hours) at 11/24/2023 1123 Last data filed at 11/24/2023 1055 Gross per 24 hour  Intake 858.35 ml  Output 850 ml  Net 8.35 ml    Physical Exam: Sedated Thin white male No murmur  Lungs clear  Abdomen benign No edema Mittens/restraints on Nasal bone fractures   Lab Results: Basic Metabolic Panel: Recent Labs    11/22/23 1902 11/22/23 2209 11/23/23 1520 11/24/23 0355  NA 142 141  --  136  K 3.4* 3.3*  --  3.2*  CL 103  --   --  106  CO2 16*  --   --  17*  GLUCOSE 81  --   --  120*  BUN 8  --   --  6*  CREATININE 0.77  --   --  1.05  CALCIUM 9.4  --   --  7.8*  MG  --   --  1.3*  --    Liver Function Tests: Recent Labs    11/22/23 1902  AST 109*  ALT 61*  ALKPHOS 54  BILITOT 1.1  PROT 6.3*  ALBUMIN 3.6   No results for input(s): "LIPASE", "AMYLASE" in the last 72 hours. CBC: Recent Labs    11/22/23 1902 11/22/23 2209 11/24/23 0355  WBC 3.6*  --  7.0  HGB 13.6 13.3 12.4*  HCT 38.1* 39.0 35.0*  MCV 100.3*  --  102.0*  PLT 84*  --  114*   Cardiac Enzymes: No results for input(s): "CKTOTAL", "CKMB", "CKMBINDEX", "TROPONINI" in the last 72 hours. BNP: Invalid input(s): "POCBNP" D-Dimer: Recent Labs    11/22/23 2143  DDIMER 1.53*   Hemoglobin A1C: Recent Labs    11/23/23 1520  HGBA1C 4.6*   Fasting Lipid Panel: Recent Labs    11/24/23 0355  CHOL 153  HDL 52  LDLCALC 78  TRIG 114  CHOLHDL 2.9   Thyroid Function Tests: Recent Labs    11/22/23 1926  TSH 2.290     Imaging: US   Abdomen Limited RUQ (LIVER/GB) Result Date: 11/23/2023 CLINICAL DATA:  Transaminitis EXAM: ULTRASOUND ABDOMEN LIMITED RIGHT UPPER QUADRANT COMPARISON:  CT abdomen and pelvis 12/13/2009 FINDINGS: Gallbladder: Non dependent nonshadowing filling defect along the lateral gallbladder wall measuring 6 mm consistent with gallbladder polyp. Based on size criteria, this is likely benign and no imaging follow-up is indicated. No stones identified. No gallbladder wall thickening or edema. Murphy's sign is negative. Common bile duct: Diameter: 3 mm, normal Liver: Diffusely increased hepatic parenchymal echotexture suggesting diffuse fatty infiltration. No focal lesions are identified. Portal vein is patent on color Doppler imaging with normal direction of blood flow towards the liver. Other: None. IMPRESSION: 1. 6 mm polyp in the gallbladder, likely benign. 2. No evidence of cholelithiasis or acute cholecystitis. 3. Increased hepatic parenchymal echotexture suggesting diffuse fatty infiltration. Electronically Signed   By: Boyce Byes M.D.   On: 11/23/2023 00:28   CT Angio Chest PE W and/or  Wo Contrast Result Date: 11/22/2023 CLINICAL DATA:  Elevated D-dimer EXAM: CT ANGIOGRAPHY CHEST WITH CONTRAST TECHNIQUE: Multidetector CT imaging of the chest was performed using the standard protocol during bolus administration of intravenous contrast. Multiplanar CT image reconstructions and MIPs were obtained to evaluate the vascular anatomy. RADIATION DOSE REDUCTION: This exam was performed according to the departmental dose-optimization program which includes automated exposure control, adjustment of the mA and/or kV according to patient size and/or use of iterative reconstruction technique. CONTRAST:  75mL OMNIPAQUE  IOHEXOL  350 MG/ML SOLN COMPARISON:  CT chest 09/07/2022 FINDINGS: Cardiovascular: Satisfactory opacification of the pulmonary arteries to the segmental level. No evidence of pulmonary embolism. Normal heart size. No  pericardial effusion. There are atherosclerotic calcifications of the aorta. Mediastinum/Nodes: There are prominent right hilar and subcarinal lymph nodes. There is enlarged subcarinal lymph node measuring 10 mm. Visualized esophagus and thyroid gland are within normal limits. Lungs/Pleura: There is lingular atelectasis. The lungs are otherwise clear. There is a 2 mm nodule in the left upper lobe image 6/58. Upper Abdomen: No acute abnormality. Musculoskeletal: No chest wall abnormality. No acute or significant osseous findings. Review of the MIP images confirms the above findings. IMPRESSION: 1. No evidence for pulmonary embolism. 2. Lingular atelectasis. 3. 2 mm left solid pulmonary nodule within the upper lobe. No follow-up needed if patient is low-risk.This recommendation follows the consensus statement: Guidelines for Management of Incidental Pulmonary Nodules Detected on CT Images: From the Fleischner Society 2017; Radiology 2017; 284:228-243. 4. Prominent right hilar and subcarinal lymph nodes, nonspecific. 5. Aortic atherosclerosis. Aortic Atherosclerosis (ICD10-I70.0). Electronically Signed   By: Tyron Gallon M.D.   On: 11/22/2023 23:10   CT Maxillofacial Wo Contrast Result Date: 11/22/2023 CLINICAL DATA:  Blunt facial trauma. Fall over a week ago. Patient reports lump on his nose. EXAM: CT MAXILLOFACIAL WITHOUT CONTRAST TECHNIQUE: Multidetector CT imaging of the maxillofacial structures was performed. Multiplanar CT image reconstructions were also generated. RADIATION DOSE REDUCTION: This exam was performed according to the departmental dose-optimization program which includes automated exposure control, adjustment of the mA and/or kV according to patient size and/or use of iterative reconstruction technique. COMPARISON:  None Available. FINDINGS: Osseous: Moderately depressed right minimally depressed left nasal bone fracture. Mild rightward nasal septal bowing. No acute fracture of the zygomatic  arches, mandibles, maxilla or pterygoid plates. Orbits: No orbital fracture.  No evidence of globe injury. Sinuses: No sinus fracture or hemosinus. Minor mucosal thickening of the maxillary sinuses. No mastoid effusion. Soft tissues: Nasal soft tissue thickening. Limited intracranial: Assessed on head CT performed concurrently. IMPRESSION: Moderately depressed right and minimally depressed left nasal bone fracture. Electronically Signed   By: Chadwick Colonel M.D.   On: 11/22/2023 19:45   CT Cervical Spine Wo Contrast Result Date: 11/22/2023 CLINICAL DATA:  Neck trauma (Age >= 65y) Fall 2 x 2 over a week ago.  Dizziness. EXAM: CT CERVICAL SPINE WITHOUT CONTRAST TECHNIQUE: Multidetector CT imaging of the cervical spine was performed without intravenous contrast. Multiplanar CT image reconstructions were also generated. RADIATION DOSE REDUCTION: This exam was performed according to the departmental dose-optimization program which includes automated exposure control, adjustment of the mA and/or kV according to patient size and/or use of iterative reconstruction technique. COMPARISON:  CT cervical spine 02/16/2018 FINDINGS: Alignment: Normal. Skull base and vertebrae: No acute fracture. Vertebral body heights are maintained. The dens and skull base are intact. Chronic degeneration at C1-C2. Soft tissues and spinal canal: No prevertebral fluid or swelling. No visible canal hematoma. Disc levels: Multilevel  degenerative disc disease is most prominent at C6-C7. There is mild multilevel facet hypertrophy. Upper chest: No acute findings. Other: Carotid calcifications. IMPRESSION: 1. No acute fracture or subluxation of the cervical spine. 2. Multilevel degenerative disc disease and facet hypertrophy. Electronically Signed   By: Chadwick Colonel M.D.   On: 11/22/2023 19:41   CT Head Wo Contrast Result Date: 11/22/2023 CLINICAL DATA:  Head trauma, moderate-severe Two falls over a week ago.  Dizziness. EXAM: CT HEAD WITHOUT  CONTRAST TECHNIQUE: Contiguous axial images were obtained from the base of the skull through the vertex without intravenous contrast. RADIATION DOSE REDUCTION: This exam was performed according to the departmental dose-optimization program which includes automated exposure control, adjustment of the mA and/or kV according to patient size and/or use of iterative reconstruction technique. COMPARISON:  None Available. FINDINGS: Brain: No intracranial hemorrhage, mass effect, or midline shift. Age related atrophy. No hydrocephalus. The basilar cisterns are patent. Mild periventricular and deep white matter hypodensity typical of chronic small vessel ischemia. No evidence of territorial infarct or acute ischemia. No extra-axial or intracranial fluid collection. Vascular: No hyperdense vessel or unexpected calcification. Skull: No fracture or focal lesion. Sinuses/Orbits: Assessed on concurrent face CT, reported separately. Other: No confluent scalp hematoma. IMPRESSION: 1. No acute intracranial abnormality. No skull fracture. 2. Age related atrophy and chronic small vessel ischemia. Electronically Signed   By: Chadwick Colonel M.D.   On: 11/22/2023 19:38    Cardiac Studies:  ECG: SVT rate 151    Telemetry:  SR now  Echo: pending  Medications:    diltiazem   240 mg Oral Daily   enoxaparin  (LOVENOX ) injection  40 mg Subcutaneous Q24H   folic acid   1 mg Oral Daily   multivitamin with minerals  1 tablet Oral Daily   thiamine   100 mg Oral Daily   Or   thiamine   100 mg Intravenous Daily      ceFEPime  (MAXIPIME ) IV Stopped (11/24/23 0239)   dextrose  5 % and 0.9 % NaCl     metronidazole  Stopped (11/24/23 0349)   vancomycin  Stopped (11/24/23 0334)    Assessment/Plan:   Tachycardia:  SVT/Flutter in setting of DTls andMay need to change back to iv cardizem  if delerium /DT;s persist currently written for 240 mg oral cardizem .  Atrial arrhythmias improved with calcium blocker. TTE pending may be delayed  first from rapid rates today from change in MS. Agree with no anticoagulation long term with ETOH, delirium and falls  SIRS:  being covered with Maxipime , metronidazole  and vanc lactic acid improved 7-> 3.9 US  no acute GB inflammation CT lingular atelectasis  urine negative suspect antibiotics can be simplified   Janelle Mediate 11/24/2023, 11:23 AM

## 2023-11-25 DIAGNOSIS — I471 Supraventricular tachycardia, unspecified: Secondary | ICD-10-CM | POA: Diagnosis not present

## 2023-11-25 DIAGNOSIS — I4891 Unspecified atrial fibrillation: Secondary | ICD-10-CM

## 2023-11-25 MED ORDER — DILTIAZEM HCL ER COATED BEADS 240 MG PO CP24
240.0000 mg | ORAL_CAPSULE | Freq: Every day | ORAL | 0 refills | Status: DC
Start: 2023-11-26 — End: 2023-12-13

## 2023-11-25 MED ORDER — TRAMADOL HCL 50 MG PO TABS
50.0000 mg | ORAL_TABLET | Freq: Four times a day (QID) | ORAL | Status: DC | PRN
  Administered 2023-11-25: 50 mg via ORAL
  Filled 2023-11-25: qty 1

## 2023-11-25 MED ORDER — POTASSIUM CHLORIDE CRYS ER 20 MEQ PO TBCR
40.0000 meq | EXTENDED_RELEASE_TABLET | Freq: Once | ORAL | Status: AC
  Administered 2023-11-25: 40 meq via ORAL
  Filled 2023-11-25: qty 2

## 2023-11-25 MED ORDER — ORAL CARE MOUTH RINSE: 15.0000 mL | OROMUCOSAL | Status: DC | PRN

## 2023-11-25 NOTE — Progress Notes (Signed)
 RNCM received HHPT orders for patient.  RNCM met with patient and his Grandson at [facility] and offered choice for [facility] services.  Family with no preference, so Angie at [facility] contacted with order and confirmation received.  Patient in agreement to services.

## 2023-11-25 NOTE — Progress Notes (Signed)
 Cardiologist:  Sheryle Donning  Subjective:   Sedated DT;s earlier with confusion and agitation Spoke with family   Objective:  Vitals:   11/24/23 1940 11/24/23 1951 11/24/23 2359 11/25/23 0500  BP: 123/77 121/74 110/71 123/80  Pulse: 91 89 90 82  Resp: 16  17 16   Temp:   98.2 F (36.8 C)   TempSrc:   Oral   SpO2: 100%  99%   Weight:      Height:        Intake/Output from previous day:  Intake/Output Summary (Last 24 hours) at 11/25/2023 0729 Last data filed at 11/25/2023 0641 Gross per 24 hour  Intake 180 ml  Output 2000 ml  Net -1820 ml    Physical Exam: Sedated Thin white male No murmur  Lungs clear  Abdomen benign No edema Mittens/restraints on Nasal bone fractures   Lab Results: Basic Metabolic Panel: Recent Labs    11/22/23 1902 11/22/23 2209 11/23/23 1520 11/24/23 0355  NA 142 141  --  136  K 3.4* 3.3*  --  3.2*  CL 103  --   --  106  CO2 16*  --   --  17*  GLUCOSE 81  --   --  120*  BUN 8  --   --  6*  CREATININE 0.77  --   --  1.05  CALCIUM 9.4  --   --  7.8*  MG  --   --  1.3* 1.8   Liver Function Tests: Recent Labs    11/22/23 1902  AST 109*  ALT 61*  ALKPHOS 54  BILITOT 1.1  PROT 6.3*  ALBUMIN 3.6   No results for input(s): "LIPASE", "AMYLASE" in the last 72 hours. CBC: Recent Labs    11/22/23 1902 11/22/23 2209 11/24/23 0355  WBC 3.6*  --  7.0  HGB 13.6 13.3 12.4*  HCT 38.1* 39.0 35.0*  MCV 100.3*  --  102.0*  PLT 84*  --  114*   Cardiac Enzymes: No results for input(s): "CKTOTAL", "CKMB", "CKMBINDEX", "TROPONINI" in the last 72 hours. BNP: Invalid input(s): "POCBNP" D-Dimer: Recent Labs    11/22/23 2143  DDIMER 1.53*   Hemoglobin A1C: Recent Labs    11/23/23 1520  HGBA1C 4.6*   Fasting Lipid Panel: Recent Labs    11/24/23 0355  CHOL 153  HDL 52  LDLCALC 78  TRIG 114  CHOLHDL 2.9   Thyroid Function Tests: Recent Labs    11/22/23 1926  TSH 2.290     Imaging: ECHOCARDIOGRAM  COMPLETE Result Date: 11/24/2023    ECHOCARDIOGRAM REPORT   Patient Name:   Gabriel Everett Date of Exam: 11/24/2023 Medical Rec #:  [id number]         Height:       70.0 in Accession #:    [id number]        Weight:       146.5 lb Date of Birth:  [birth year]         BSA:          1.829 m Patient Age:    78 years          BP:           123/81 mmHg Patient Gender: M                 HR:           79 bpm. Exam Location:  Inpatient Procedure: 2D Echo, 3D Echo, Cardiac Doppler, Color Doppler and  Strain Analysis            (Both Spectral and Color Flow Doppler were utilized during            procedure). Indications:    Abnormal ECG  History:        Patient has prior history of Echocardiogram examinations, most                 recent 06/09/2013. Arrythmias:Atrial Flutter and PVC,                 Signs/Symptoms:Dizziness/Lightheadedness; Risk                 Factors:Hypertension.  Sonographer:    Travis Friedman RDCS Referring Phys: [id number] RONDELL A SMITH IMPRESSIONS  1. Global hypokinesis abnormal septal motion . Left ventricular ejection fraction, by estimation, is 50 to 55%. The left ventricle has low normal function. The left ventricle demonstrates global hypokinesis. Left ventricular diastolic parameters are consistent with Grade I diastolic dysfunction (impaired relaxation). The average left ventricular global longitudinal strain is -15.8 %. The global longitudinal strain is normal.  2. Right ventricular systolic function is normal. The right ventricular size is normal.  3. Left atrial size was mildly dilated.  4. The mitral valve is abnormal. No evidence of mitral valve regurgitation. No evidence of mitral stenosis.  5. The aortic valve is tricuspid. There is mild calcification of the aortic valve. There is mild thickening of the aortic valve. Aortic valve regurgitation is not visualized. Aortic valve sclerosis is present, with no evidence of aortic valve stenosis.  6. The inferior vena cava is normal in size with greater  than 50% respiratory variability, suggesting right atrial pressure of 3 mmHg. FINDINGS  Left Ventricle: Global hypokinesis abnormal septal motion. Left ventricular ejection fraction, by estimation, is 50 to 55%. The left ventricle has low normal function. The left ventricle demonstrates global hypokinesis. The average left ventricular global longitudinal strain is -15.8 %. Strain was performed and the global longitudinal strain is normal. The left ventricular internal cavity size was normal in size. There is no left ventricular hypertrophy. Left ventricular diastolic parameters are consistent with Grade I diastolic dysfunction (impaired relaxation). Right Ventricle: The right ventricular size is normal. No increase in right ventricular wall thickness. Right ventricular systolic function is normal. Left Atrium: Left atrial size was mildly dilated. Right Atrium: Right atrial size was normal in size. Pericardium: There is no evidence of pericardial effusion. Mitral Valve: The mitral valve is abnormal. There is mild thickening of the mitral valve leaflet(s). There is mild calcification of the mitral valve leaflet(s). Mild mitral annular calcification. No evidence of mitral valve regurgitation. No evidence of mitral valve stenosis. Tricuspid Valve: The tricuspid valve is normal in structure. Tricuspid valve regurgitation is not demonstrated. No evidence of tricuspid stenosis. Aortic Valve: The aortic valve is tricuspid. There is mild calcification of the aortic valve. There is mild thickening of the aortic valve. Aortic valve regurgitation is not visualized. Aortic valve sclerosis is present, with no evidence of aortic valve stenosis. Aortic valve mean gradient measures 2.0 mmHg. Aortic valve peak gradient measures 2.8 mmHg. Aortic valve area, by VTI measures 4.21 cm. Pulmonic Valve: The pulmonic valve was normal in structure. Pulmonic valve regurgitation is not visualized. No evidence of pulmonic stenosis. Aorta: The  aortic root is normal in size and structure. Venous: The inferior vena cava is normal in size with greater than 50% respiratory variability, suggesting right atrial pressure of 3 mmHg. IAS/Shunts: No  atrial level shunt detected by color flow Doppler. Additional Comments: 3D was performed not requiring image post processing on an independent workstation and was abnormal.  LEFT VENTRICLE PLAX 2D LVIDd:         4.70 cm   Diastology LVIDs:         3.30 cm   LV e' medial:    7.51 cm/s LV PW:         1.40 cm   LV E/e' medial:  6.3 LV IVS:        1.10 cm   LV e' lateral:   8.38 cm/s LVOT diam:     2.40 cm   LV E/e' lateral: 5.6 LV SV:         73 LV SV Index:   40        2D Longitudinal Strain LVOT Area:     4.52 cm  2D Strain GLS Avg:     -15.8 %                           3D Volume EF:                          3D EF:        45 %                          LV EDV:       146 ml                          LV ESV:       79 ml                          LV SV:        66 ml RIGHT VENTRICLE             IVC RV Basal diam:  3.10 cm     IVC diam: 1.10 cm RV S prime:     15.20 cm/s TAPSE (M-mode): 1.8 cm LEFT ATRIUM             Index        RIGHT ATRIUM           Index LA diam:        3.80 cm 2.08 cm/m   RA Area:     12.80 cm LA Vol (A2C):   52.0 ml 28.44 ml/m  RA Volume:   26.70 ml  14.60 ml/m LA Vol (A4C):   24.9 ml 13.62 ml/m LA Biplane Vol: 36.5 ml 19.96 ml/m  AORTIC VALVE AV Area (Vmax):    4.29 cm AV Area (Vmean):   4.13 cm AV Area (VTI):     4.21 cm AV Vmax:           83.40 cm/s AV Vmean:          56.800 cm/s AV VTI:            0.173 m AV Peak Grad:      2.8 mmHg AV Mean Grad:      2.0 mmHg LVOT Vmax:         79.10 cm/s LVOT Vmean:        51.900 cm/s LVOT VTI:          0.161 m LVOT/AV VTI ratio: 0.93  AORTA Ao Root diam: 3.20 cm Ao Asc diam:  3.20 cm MITRAL VALVE MV Area (PHT): 3.42 cm    SHUNTS MV Decel Time: 222 msec    Systemic VTI:  0.16 m MV E velocity: 47.20 cm/s  Systemic Diam: 2.40 cm MV A velocity: 82.80 cm/s MV  E/A ratio:  0.57 Janelle Mediate MD Electronically signed by Janelle Mediate MD Signature Date/Time: 11/24/2023/3:32:33 PM    Final     Cardiac Studies:  ECG: SVT rate 151    Telemetry:  SR now  Echo: EF 50-55% AV sclerosis   Medications:    diltiazem   240 mg Oral Daily   enoxaparin  (LOVENOX ) injection  40 mg Subcutaneous Q24H   folic acid   1 mg Oral Daily   multivitamin with minerals  1 tablet Oral Daily   thiamine   100 mg Oral Daily   Or   thiamine   100 mg Intravenous Daily      ceFEPime  (MAXIPIME ) IV 2 g (11/24/23 1955)   dextrose  5 % and 0.9 % NaCl 75 mL/hr at 11/24/23 1801   metronidazole  500 mg (11/25/23 0352)   vancomycin  1,250 mg (11/24/23 2130)    Assessment/Plan:   Tachycardia:  SVT/Flutter in setting of DTls andMay need to change back to iv cardizem  if delerium /DT;s persist currently written for 240 mg oral cardizem .  Atrial arrhythmias improved with calcium blocker. TTE with  EF 50-55% no sig valve dx  Agree with no anticoagulation long term with ETOH, delirium and falls  SIRS:  being covered with Maxipime , metronidazole  and vanc lactic acid improved 7-> 3.9 US  no acute GB inflammation CT lingular atelectasis  urine negative suspect antibiotics can be simplified   Janelle Mediate 11/25/2023, 7:29 AM

## 2023-11-25 NOTE — Discharge Summary (Signed)
 Physician Discharge Summary  Gabriel Everett QMV:[id number] DOB: October 10, 1945 DOA: 11/22/2023  PCP: Trinda Fuller, FNP  Admit date: 11/22/2023 Discharge date: 11/25/2023  Admitted From: home Disposition:  home  Recommendations for Outpatient Follow-up:  Follow up with PCP in 1-2 weeks  Home Health: PT Equipment/Devices: none  Discharge Condition: stable CODE STATUS: Full code Diet Orders (From admission, onward)     Start     Ordered   11/23/23 1400  Diet Heart Room service appropriate? Yes; Fluid consistency: Thin  Diet effective now       Question Answer Comment  Room service appropriate? Yes   Fluid consistency: Thin      11/23/23 1402            Brief Narrative / Interim history: 78 year old male with history of PAF previously on Xarelto , PVCs, is brought to the hospital by family for lightheadedness, dizziness, several falls.  He was found to be significantly tachycardic SVT versus A-fib/flutter with RVR and was admitted to the hospital.  Cardiology was consulted.  In the ED was also noted to be tachypneic, have leukopenia and had an elevated lactic acid.  Cultures were obtained and he was started on antibiotics  Hospital Course / Discharge diagnoses: Principal Problem:   Atrial fibrillation with RVR (HCC) Active Problems:   SIRS (systemic inflammatory response syndrome) (HCC)   Atrial flutter (HCC)   Frequent falls   Essential hypertension   Hypokalemia   Hypomagnesemia   Alcohol  use   PAF (paroxysmal atrial fibrillation) (HCC)   Principal problem Paroxysmal A-flutter versus SVT-cardiology consulted and followed patient while hospitalized.  He was placed on diltiazem  infusion with improvement in his rates, now in addition to diltiazem  with improvement in his rates.  He was on anticoagulation in the past, declining to go on it and probably a good idea per cardiology given his EtOH use and falls   Active problems SIRS-he had tachypnea, tachycardia, leukopenia  and elevated lactic acid on admission.  Unclear etiology, but likely related to a degree of dehydration, poor p.o. intake given ketonuria.  Has received fluids, was briefly on antibiotics, cultures showing micrococcus in only 1 set which is believed to be a contaminant.  He is afebrile, white count is normal and he is SIRS has resolved with resolution of his paroxysmal a flutter and withdrawals. Hypokalemia-potassium replenished prior to discharge Hypomagnesemia-magnesium  normalized after repletion EtOH use, with withdrawals, delirium -patient experienced delirium and encephalopathy Hospital day #2, but then eventually resolved, is now back to baseline, alert and oriented x 4 and confirmed by the family Recurrent falls-possibly related to his tachycardia versus ongoing EtOH use.  Worked well with PT, home health PT will be arranged, patient was strongly advised to avoid any EtOH Essential hypertension-continue diltiazem , blood pressure stable Gallbladder polyp-incidental, should be followed up as an outpatient Thrombocytopenia-due to EtOH use, stable  Sepsis ruled out   Discharge Instructions   Allergies as of 11/25/2023   No Known Allergies      Medication List     STOP taking these medications    doxycycline 100 MG capsule Commonly known as: MONODOX   methocarbamol  500 MG tablet Commonly known as: ROBAXIN    predniSONE 10 MG tablet Commonly known as: DELTASONE   Rivaroxaban  15 MG Tabs tablet Commonly known as: Xarelto        TAKE these medications    cephALEXin 500 MG capsule Commonly known as: KEFLEX Take 500 mg by mouth every 12 (twelve) hours as needed (for infections,allergies  per patient).   diltiazem  240 MG 24 hr capsule Commonly known as: CARDIZEM  CD Take 1 capsule (240 mg total) by mouth daily. Start taking on: November 26, 2023   ibuprofen  400 MG tablet Commonly known as: ADVIL  Take 1 tablet (400 mg total) by mouth every 6 (six) hours as needed. What changed:  reasons to take this       Consultations: Cardiology   Procedures/Studies:  ECHOCARDIOGRAM COMPLETE Result Date: 11/24/2023    ECHOCARDIOGRAM REPORT   Patient Name:   Gabriel Everett Date of Exam: 11/24/2023 Medical Rec #:  [id number]         Height:       70.0 in Accession #:    [id number]        Weight:       146.5 lb Date of Birth:  10-05-1945         BSA:          1.829 m Patient Age:    78 years          BP:           123/81 mmHg Patient Gender: M                 HR:           79 bpm. Exam Location:  Inpatient Procedure: 2D Echo, 3D Echo, Cardiac Doppler, Color Doppler and Strain Analysis            (Both Spectral and Color Flow Doppler were utilized during            procedure). Indications:    Abnormal ECG  History:        Patient has prior history of Echocardiogram examinations, most                 recent 06/09/2013. Arrythmias:Atrial Flutter and PVC,                 Signs/Symptoms:Dizziness/Lightheadedness; Risk                 Factors:Hypertension.  Sonographer:    Travis Friedman RDCS Referring Phys: [id number] RONDELL A SMITH IMPRESSIONS  1. Global hypokinesis abnormal septal motion . Left ventricular ejection fraction, by estimation, is 50 to 55%. The left ventricle has low normal function. The left ventricle demonstrates global hypokinesis. Left ventricular diastolic parameters are consistent with Grade I diastolic dysfunction (impaired relaxation). The average left ventricular global longitudinal strain is -15.8 %. The global longitudinal strain is normal.  2. Right ventricular systolic function is normal. The right ventricular size is normal.  3. Left atrial size was mildly dilated.  4. The mitral valve is abnormal. No evidence of mitral valve regurgitation. No evidence of mitral stenosis.  5. The aortic valve is tricuspid. There is mild calcification of the aortic valve. There is mild thickening of the aortic valve. Aortic valve regurgitation is not visualized. Aortic valve sclerosis is present,  with no evidence of aortic valve stenosis.  6. The inferior vena cava is normal in size with greater than 50% respiratory variability, suggesting right atrial pressure of 3 mmHg. FINDINGS  Left Ventricle: Global hypokinesis abnormal septal motion. Left ventricular ejection fraction, by estimation, is 50 to 55%. The left ventricle has low normal function. The left ventricle demonstrates global hypokinesis. The average left ventricular global longitudinal strain is -15.8 %. Strain was performed and the global longitudinal strain is normal. The left ventricular internal cavity size was normal in size. There is no left  ventricular hypertrophy. Left ventricular diastolic parameters are consistent with Grade I diastolic dysfunction (impaired relaxation). Right Ventricle: The right ventricular size is normal. No increase in right ventricular wall thickness. Right ventricular systolic function is normal. Left Atrium: Left atrial size was mildly dilated. Right Atrium: Right atrial size was normal in size. Pericardium: There is no evidence of pericardial effusion. Mitral Valve: The mitral valve is abnormal. There is mild thickening of the mitral valve leaflet(s). There is mild calcification of the mitral valve leaflet(s). Mild mitral annular calcification. No evidence of mitral valve regurgitation. No evidence of mitral valve stenosis. Tricuspid Valve: The tricuspid valve is normal in structure. Tricuspid valve regurgitation is not demonstrated. No evidence of tricuspid stenosis. Aortic Valve: The aortic valve is tricuspid. There is mild calcification of the aortic valve. There is mild thickening of the aortic valve. Aortic valve regurgitation is not visualized. Aortic valve sclerosis is present, with no evidence of aortic valve stenosis. Aortic valve mean gradient measures 2.0 mmHg. Aortic valve peak gradient measures 2.8 mmHg. Aortic valve area, by VTI measures 4.21 cm. Pulmonic Valve: The pulmonic valve was normal in  structure. Pulmonic valve regurgitation is not visualized. No evidence of pulmonic stenosis. Aorta: The aortic root is normal in size and structure. Venous: The inferior vena cava is normal in size with greater than 50% respiratory variability, suggesting right atrial pressure of 3 mmHg. IAS/Shunts: No atrial level shunt detected by color flow Doppler. Additional Comments: 3D was performed not requiring image post processing on an independent workstation and was abnormal.  LEFT VENTRICLE PLAX 2D LVIDd:         4.70 cm   Diastology LVIDs:         3.30 cm   LV e' medial:    7.51 cm/s LV PW:         1.40 cm   LV E/e' medial:  6.3 LV IVS:        1.10 cm   LV e' lateral:   8.38 cm/s LVOT diam:     2.40 cm   LV E/e' lateral: 5.6 LV SV:         73 LV SV Index:   40        2D Longitudinal Strain LVOT Area:     4.52 cm  2D Strain GLS Avg:     -15.8 %                           3D Volume EF:                          3D EF:        45 %                          LV EDV:       146 ml                          LV ESV:       79 ml                          LV SV:        66 ml RIGHT VENTRICLE             IVC RV Basal diam:  3.10 cm  IVC diam: 1.10 cm RV S prime:     15.20 cm/s TAPSE (M-mode): 1.8 cm LEFT ATRIUM             Index        RIGHT ATRIUM           Index LA diam:        3.80 cm 2.08 cm/m   RA Area:     12.80 cm LA Vol (A2C):   52.0 ml 28.44 ml/m  RA Volume:   26.70 ml  14.60 ml/m LA Vol (A4C):   24.9 ml 13.62 ml/m LA Biplane Vol: 36.5 ml 19.96 ml/m  AORTIC VALVE AV Area (Vmax):    4.29 cm AV Area (Vmean):   4.13 cm AV Area (VTI):     4.21 cm AV Vmax:           83.40 cm/s AV Vmean:          56.800 cm/s AV VTI:            0.173 m AV Peak Grad:      2.8 mmHg AV Mean Grad:      2.0 mmHg LVOT Vmax:         79.10 cm/s LVOT Vmean:        51.900 cm/s LVOT VTI:          0.161 m LVOT/AV VTI ratio: 0.93  AORTA Ao Root diam: 3.20 cm Ao Asc diam:  3.20 cm MITRAL VALVE MV Area (PHT): 3.42 cm    SHUNTS MV Decel Time: 222 msec     Systemic VTI:  0.16 m MV E velocity: 47.20 cm/s  Systemic Diam: 2.40 cm MV A velocity: 82.80 cm/s MV E/A ratio:  0.57 Janelle Mediate MD Electronically signed by Janelle Mediate MD Signature Date/Time: 11/24/2023/3:32:33 PM    Final    US  Abdomen Limited RUQ (LIVER/GB) Result Date: 11/23/2023 CLINICAL DATA:  Transaminitis EXAM: ULTRASOUND ABDOMEN LIMITED RIGHT UPPER QUADRANT COMPARISON:  CT abdomen and pelvis 12/13/2009 FINDINGS: Gallbladder: Non dependent nonshadowing filling defect along the lateral gallbladder wall measuring 6 mm consistent with gallbladder polyp. Based on size criteria, this is likely benign and no imaging follow-up is indicated. No stones identified. No gallbladder wall thickening or edema. Murphy's sign is negative. Common bile duct: Diameter: 3 mm, normal Liver: Diffusely increased hepatic parenchymal echotexture suggesting diffuse fatty infiltration. No focal lesions are identified. Portal vein is patent on color Doppler imaging with normal direction of blood flow towards the liver. Other: None. IMPRESSION: 1. 6 mm polyp in the gallbladder, likely benign. 2. No evidence of cholelithiasis or acute cholecystitis. 3. Increased hepatic parenchymal echotexture suggesting diffuse fatty infiltration. Electronically Signed   By: Boyce Byes M.D.   On: 11/23/2023 00:28   CT Angio Chest PE W and/or Wo Contrast Result Date: 11/22/2023 CLINICAL DATA:  Elevated D-dimer EXAM: CT ANGIOGRAPHY CHEST WITH CONTRAST TECHNIQUE: Multidetector CT imaging of the chest was performed using the standard protocol during bolus administration of intravenous contrast. Multiplanar CT image reconstructions and MIPs were obtained to evaluate the vascular anatomy. RADIATION DOSE REDUCTION: This exam was performed according to the departmental dose-optimization program which includes automated exposure control, adjustment of the mA and/or kV according to patient size and/or use of iterative reconstruction technique.  CONTRAST:  75mL OMNIPAQUE  IOHEXOL  350 MG/ML SOLN COMPARISON:  CT chest 09/07/2022 FINDINGS: Cardiovascular: Satisfactory opacification of the pulmonary arteries to the segmental level. No evidence of pulmonary embolism. Normal heart size. No pericardial effusion. There are  atherosclerotic calcifications of the aorta. Mediastinum/Nodes: There are prominent right hilar and subcarinal lymph nodes. There is enlarged subcarinal lymph node measuring 10 mm. Visualized esophagus and thyroid gland are within normal limits. Lungs/Pleura: There is lingular atelectasis. The lungs are otherwise clear. There is a 2 mm nodule in the left upper lobe image 6/58. Upper Abdomen: No acute abnormality. Musculoskeletal: No chest wall abnormality. No acute or significant osseous findings. Review of the MIP images confirms the above findings. IMPRESSION: 1. No evidence for pulmonary embolism. 2. Lingular atelectasis. 3. 2 mm left solid pulmonary nodule within the upper lobe. No follow-up needed if patient is low-risk.This recommendation follows the consensus statement: Guidelines for Management of Incidental Pulmonary Nodules Detected on CT Images: From the Fleischner Society 2017; Radiology 2017; 284:228-243. 4. Prominent right hilar and subcarinal lymph nodes, nonspecific. 5. Aortic atherosclerosis. Aortic Atherosclerosis (ICD10-I70.0). Electronically Signed   By: Tyron Gallon M.D.   On: 11/22/2023 23:10   CT Maxillofacial Wo Contrast Result Date: 11/22/2023 CLINICAL DATA:  Blunt facial trauma. Fall over a week ago. Patient reports lump on his nose. EXAM: CT MAXILLOFACIAL WITHOUT CONTRAST TECHNIQUE: Multidetector CT imaging of the maxillofacial structures was performed. Multiplanar CT image reconstructions were also generated. RADIATION DOSE REDUCTION: This exam was performed according to the departmental dose-optimization program which includes automated exposure control, adjustment of the mA and/or kV according to patient size  and/or use of iterative reconstruction technique. COMPARISON:  None Available. FINDINGS: Osseous: Moderately depressed right minimally depressed left nasal bone fracture. Mild rightward nasal septal bowing. No acute fracture of the zygomatic arches, mandibles, maxilla or pterygoid plates. Orbits: No orbital fracture.  No evidence of globe injury. Sinuses: No sinus fracture or hemosinus. Minor mucosal thickening of the maxillary sinuses. No mastoid effusion. Soft tissues: Nasal soft tissue thickening. Limited intracranial: Assessed on head CT performed concurrently. IMPRESSION: Moderately depressed right and minimally depressed left nasal bone fracture. Electronically Signed   By: Chadwick Colonel M.D.   On: 11/22/2023 19:45   CT Cervical Spine Wo Contrast Result Date: 11/22/2023 CLINICAL DATA:  Neck trauma (Age >= 65y) Fall 2 x 2 over a week ago.  Dizziness. EXAM: CT CERVICAL SPINE WITHOUT CONTRAST TECHNIQUE: Multidetector CT imaging of the cervical spine was performed without intravenous contrast. Multiplanar CT image reconstructions were also generated. RADIATION DOSE REDUCTION: This exam was performed according to the departmental dose-optimization program which includes automated exposure control, adjustment of the mA and/or kV according to patient size and/or use of iterative reconstruction technique. COMPARISON:  CT cervical spine 02/16/2018 FINDINGS: Alignment: Normal. Skull base and vertebrae: No acute fracture. Vertebral body heights are maintained. The dens and skull base are intact. Chronic degeneration at C1-C2. Soft tissues and spinal canal: No prevertebral fluid or swelling. No visible canal hematoma. Disc levels: Multilevel degenerative disc disease is most prominent at C6-C7. There is mild multilevel facet hypertrophy. Upper chest: No acute findings. Other: Carotid calcifications. IMPRESSION: 1. No acute fracture or subluxation of the cervical spine. 2. Multilevel degenerative disc disease and  facet hypertrophy. Electronically Signed   By: Chadwick Colonel M.D.   On: 11/22/2023 19:41   CT Head Wo Contrast Result Date: 11/22/2023 CLINICAL DATA:  Head trauma, moderate-severe Two falls over a week ago.  Dizziness. EXAM: CT HEAD WITHOUT CONTRAST TECHNIQUE: Contiguous axial images were obtained from the base of the skull through the vertex without intravenous contrast. RADIATION DOSE REDUCTION: This exam was performed according to the departmental dose-optimization program which includes automated exposure control, adjustment  of the mA and/or kV according to patient size and/or use of iterative reconstruction technique. COMPARISON:  None Available. FINDINGS: Brain: No intracranial hemorrhage, mass effect, or midline shift. Age related atrophy. No hydrocephalus. The basilar cisterns are patent. Mild periventricular and deep white matter hypodensity typical of chronic small vessel ischemia. No evidence of territorial infarct or acute ischemia. No extra-axial or intracranial fluid collection. Vascular: No hyperdense vessel or unexpected calcification. Skull: No fracture or focal lesion. Sinuses/Orbits: Assessed on concurrent face CT, reported separately. Other: No confluent scalp hematoma. IMPRESSION: 1. No acute intracranial abnormality. No skull fracture. 2. Age related atrophy and chronic small vessel ischemia. Electronically Signed   By: Chadwick Colonel M.D.   On: 11/22/2023 19:38     Subjective: - no chest pain, shortness of breath, no abdominal pain, nausea or vomiting.   Discharge Exam: BP (!) 147/83 (BP Location: Left Arm)   Pulse 95   Temp (P) 98.6 F (37 C) (Oral)   Resp (P) 18   Ht 5\' 10"  (1.778 m)   Wt 66.5 kg   SpO2 95%   BMI 21.02 kg/m   General: Pt is alert, awake, not in acute distress Cardiovascular: RRR, S1/S2 +, no rubs, no gallops Respiratory: CTA bilaterally, no wheezing, no rhonchi Abdominal: Soft, NT, ND, bowel sounds + Extremities: no edema, no cyanosis    The  results of significant diagnostics from this hospitalization (including imaging, microbiology, ancillary and laboratory) are listed below for reference.     Microbiology: Recent Results (from the past 240 hours)  Blood culture (routine x 2)     Status: None (Preliminary result)   Collection Time: 11/23/23 12:05 AM   Specimen: BLOOD LEFT FOREARM  Result Value Ref Range Status   Specimen Description   Final    BLOOD LEFT FOREARM Performed at [facility], [address] [place], [place] [address]    Special Requests   Final    BOTTLES DRAWN AEROBIC AND ANAEROBIC Blood Culture adequate volume Performed at [facility], [address], [place], [place] [address]    Culture   Final    NO GROWTH 1 DAY Performed at [facility], [address], [place], [place] [address]    Report Status PENDING  Incomplete  Blood culture (routine x 2)     Status: Abnormal (Preliminary result)   Collection Time: 11/23/23 12:23 AM   Specimen: BLOOD  Result Value Ref Range Status   Specimen Description   Final    BLOOD RIGHT ANTECUBITAL Performed at [facility], [address] [place], [place] [address]    Special Requests   Final    BOTTLES DRAWN AEROBIC AND ANAEROBIC Blood Culture adequate volume Performed at [facility], [address], [place], [place] [address]    Culture (A)  Final    MICROCOCCUS SPECIES Standardized susceptibility testing for this organism is not available. Performed at [facility], [address] [place], [place] [address]    Report Status PENDING  Incomplete     Labs: Basic Metabolic Panel: Recent Labs  Lab 11/22/23 1902 11/22/23 2209 11/23/23 1520 11/24/23 0355  NA 142 141  --  136  K 3.4* 3.3*  --  3.2*  CL 103  --   --  106  CO2 16*  --   --  17*  GLUCOSE 81  --   --  120*  BUN 8  --   --  6*  CREATININE 0.77  --   --  1.05  CALCIUM 9.4  --   --  7.8*  MG  --   --  1.3* 1.8    Liver Function Tests: Recent Labs  Lab 11/22/23 1902  AST 109*  ALT 61*  ALKPHOS 54  BILITOT 1.1  PROT 6.3*  ALBUMIN 3.6   CBC: Recent Labs  Lab 11/22/23 1902 11/22/23 2209 11/24/23 0355  WBC 3.6*  --  7.0  HGB 13.6 13.3 12.4*  HCT 38.1* 39.0 35.0*  MCV 100.3*  --  102.0*  PLT 84*  --  114*   CBG: No results for input(s): "GLUCAP" in the last 168 hours. Hgb A1c Recent Labs    11/23/23 1520  HGBA1C 4.6*   Lipid Profile Recent Labs    11/24/23 0355  CHOL 153  HDL 52  LDLCALC 78  TRIG 114  CHOLHDL 2.9   Thyroid function studies Recent Labs    11/22/23 1926  TSH 2.290   Urinalysis    Component Value Date/Time   COLORURINE YELLOW 11/22/2023 2149   APPEARANCEUR CLEAR 11/22/2023 2149   LABSPEC 1.009 11/22/2023 2149   PHURINE 7.0 11/22/2023 2149   GLUCOSEU NEGATIVE 11/22/2023 2149   HGBUR NEGATIVE 11/22/2023 2149   BILIRUBINUR NEGATIVE 11/22/2023 2149   KETONESUR 15 (A) 11/22/2023 2149   PROTEINUR NEGATIVE 11/22/2023 2149   UROBILINOGEN 0.2 04/27/2009 1425   NITRITE NEGATIVE 11/22/2023 2149   LEUKOCYTESUR NEGATIVE 11/22/2023 2149    FURTHER DISCHARGE INSTRUCTIONS:   Get Medicines reviewed and adjusted: Please take all your medications with you for your next visit with your Primary MD   Laboratory/radiological data: Please request your Primary MD to go over all hospital tests and procedure/radiological results at the follow up, please ask your Primary MD to get all Hospital records sent to his/her office.   In some cases, they will be blood work, cultures and biopsy results pending at the time of your discharge. Please request that your primary care M.D. goes through all the records of your hospital data and follows up on these results.   Also Note the following: If you experience worsening of your admission symptoms, develop shortness of breath, life threatening emergency, suicidal or homicidal thoughts you must seek medical attention immediately  by calling 911 or calling your MD immediately  if symptoms less severe.   You must read complete instructions/literature along with all the possible adverse reactions/side effects for all the Medicines you take and that have been prescribed to you. Take any new Medicines after you have completely understood and accpet all the possible adverse reactions/side effects.    Do not drive when taking Pain medications or sleeping medications (Benzodaizepines)   Do not take more than prescribed Pain, Sleep and Anxiety Medications. It is not advisable to combine anxiety,sleep and pain medications without talking with your primary care practitioner   Special Instructions: If you have smoked or chewed Tobacco  in the last 2 yrs please stop smoking, stop any regular Alcohol   and or any Recreational drug use.   Wear Seat belts while driving.   Please note: You were cared for by a hospitalist during your hospital stay. Once you are discharged, your primary care physician will handle any further medical issues. Please note that NO REFILLS for any discharge medications will be authorized once you are discharged, as it is imperative that you return to your primary care physician (or establish a relationship with a primary care physician if you do not have one) for your post hospital discharge needs  so that they can reassess your need for medications and monitor your lab values.  Time coordinating discharge: 40 minutes  SIGNED:  Kathlen Para, MD, PhD 11/25/2023, 1:20 PM

## 2023-11-25 NOTE — Plan of Care (Signed)

## 2023-11-25 NOTE — Evaluation (Signed)
 Physical Therapy Evaluation Patient Details Name: Gabriel Everett MRN: [id number] DOB: [birth year] Today's Date: 11/25/2023  History of Present Illness  Gabriel Everett is a 78 y.o. male admitted 11/22/23 for A-Fib with RVR. Pt presented with lightheadedness, dizziness, and several falls. Found to be significantly tachycardic. CTA chest with solid pulmonary nodule 2mm in upper lobe along with aortic atherosclerosis. RUQ ultrasound notable for 6mm polyp in gallbladder and diffuse fatty infiltration of liver.  Increased confusion, requiring restraints and sitter 6/6-6/7. PMhx: atrial fibrillation previously on Xarelto  and PVCs   Clinical Impression  Pt admitted with above diagnosis. 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 He lives alone in a two story house where he resides on the main level and has 3 STE with railings. Pt currently with functional limitations due to the deficits listed below (see PT Problem List). He required minA for bed mobility, transfers, and gait. Pt demonstrated gross unsteadiness and benefited from the introduction of a RW for increased support. He reported "feeling off" during OOB mobility, but denied dizziness/lightheadedness. His SBP dropped >20mmHg during position change from seated to standing and dropped during prolonged standing. Pt will benefit from acute skilled PT to increase his independence and safety with mobility to allow discharge. Given the pt's high fall risk recommend increased family support with someone staying with him to provide supervision. If this is able to be arranged feel he could d/c home with HHPT.     11/25/23 1100  Vital Signs  Patient Position (if appropriate) Orthostatic Vitals  Orthostatic Lying   BP- Lying 135/78  Pulse- Lying 98  Orthostatic Sitting  BP- Sitting 138/73  Pulse- Sitting 106  Orthostatic Standing at 0 minutes  BP- Standing at 0 minutes  106/71  Pulse- Standing at 0 minutes 118  Orthostatic Standing at 3 minutes  BP- Standing at 3 minutes 96/74  Pulse- Standing at 3 minutes 120         If plan is discharge home, recommend the following: A little help with walking and/or transfers;A little help with bathing/dressing/bathroom;Assistance with cooking/housework;Assist for transportation;Help with stairs or ramp for entrance   Can travel by private vehicle        Equipment Recommendations BSC/3in1  Recommendations for Other Services  OT consult    Functional Status Assessment Patient has had a recent decline in their functional status and demonstrates the ability to make significant improvements in function in a reasonable and predictable amount of time.     Precautions / Restrictions Precautions Precautions: Fall Recall of Precautions/Restrictions: Intact Precaution/Restrictions Comments: watch BP and HR Restrictions Weight Bearing Restrictions Per Provider Order: No      Mobility  Bed Mobility Overal bed mobility: Needs Assistance Bed Mobility: Supine to Sit     Supine to sit: HOB elevated, Used rails, Min assist     General bed mobility comments: Pt sat up on R side of bed with increased time. He brought BLE off EOB. Assist to elevate trunk and aid in scooting fwd til feet supported.    Transfers Overall transfer level: Needs assistance Equipment used: Rolling walker (2 wheels) Transfers: Sit to/from Stand, Bed to chair/wheelchair/BSC Sit to Stand: Min assist   Step pivot transfers: Min assist       General transfer comment: Pt stood from lowest bed height and recliner chair by pushing up with BUE support. He required minA to power up and minA at trunk provide stability as  pt had a mild tremor throughout his body. Good eccentric control with sitting.    Ambulation/Gait Ambulation/Gait assistance: Min assist Gait Distance (Feet): 60 Feet Assistive device: Rolling walker (2 wheels) Gait  Pattern/deviations: Step-through pattern, Decreased step length - right, Decreased step length - left, Decreased stride length, Trunk flexed Gait velocity: decreased Gait velocity interpretation: <1.31 ft/sec, indicative of household ambulator   General Gait Details: Introduced RW and educated pt on proper sequencing and technique. Pt ambulated with short slow steps and limited foot clearence. He maintained fwd lean over AD. Pt required minA to help manuever RW in tight environments, he had difficulty shifting it. Pt grossly unsteady with mild full body tremors. Pt reported "feeling off" but denied dizziness/lightheadedness.  Stairs            Wheelchair Mobility     Tilt Bed    Modified Rankin (Stroke Patients Only)       Balance Overall balance assessment: Needs assistance, History of Falls Sitting-balance support: Bilateral upper extremity supported, Feet supported Sitting balance-Leahy Scale: Fair Sitting balance - Comments: Pt sat EOB with CGA for safety/stability.   Standing balance support: Bilateral upper extremity supported, During functional activity, Reliant on assistive device for balance Standing balance-Leahy Scale: Poor Standing balance comment: Pt dependent on RW for support and minA for stability.                             Pertinent Vitals/Pain Pain Assessment Pain Assessment: No/denies pain    Home Living Family/patient expects to be discharged to:: Private residence Living Arrangements: Alone Available Help at Discharge: Family;Available PRN/intermittently (Grandson lives down the street.) Type of Home: House Home Access: Stairs to enter Entrance Stairs-Rails: Right;Left;Can reach both Entrance Stairs-Number of Steps: 3 Alternate Level Stairs-Number of Steps: 18 Home Layout: Two level;Able to live on main level with bedroom/bathroom Home Equipment: Cane - single point;Rolling Walker (2 wheels);Lift chair;Grab bars - toilet;Grab bars -  tub/shower Additional Comments: Grandson reports family could coordinating increasing support and someone would be able to stay with the pt.    Prior Function Prior Level of Function : Independent/Modified Independent;Driving;History of Falls (last six months);Working/employed             Mobility Comments: Ambulates without AD. Able to ascend/descend stairs. 2 falls in the last 70mo. ADLs Comments: Independent with ADLs/IADLs. Drives. 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     Extremity/Trunk Assessment   Upper Extremity Assessment Upper Extremity Assessment: Overall WFL for tasks assessed    Lower Extremity Assessment Lower Extremity Assessment: Overall WFL for tasks assessed    Cervical / Trunk Assessment Cervical / Trunk Assessment: Kyphotic  Communication   Communication Communication: Impaired Factors Affecting Communication: Hearing impaired    Cognition Arousal: Alert Behavior During Therapy: WFL for tasks assessed/performed   PT - Cognitive impairments: Sequencing, Safety/Judgement, Problem solving                       PT - Cognition Comments: Pt A,Ox4. He demonstrated delayed processing and poor safety awareness. Mutli-modal cueing utilized throughout. Following commands: Intact       Cueing Cueing Techniques: Verbal cues, Gestural cues, Tactile cues     General Comments General comments (skin integrity, edema, etc.): HR at rest 90-106bpm, became tachycardic with activity ranging in the 120s-140s with max at 141bpm. BP: supine 135/78 (96), seated EOB 138/73 (90), standing 106/71 (79), post-ambulation 96/74 (81), seated 109/74 (84).  Exercises     Assessment/Plan    PT Assessment Patient needs continued PT services  PT Problem List Decreased activity tolerance;Decreased balance;Decreased mobility;Decreased safety awareness;Cardiopulmonary status limiting activity;Decreased knowledge of use of DME       PT Treatment Interventions DME  instruction;Gait training;Stair training;Functional mobility training;Therapeutic activities;Therapeutic exercise;Balance training;Patient/family education    PT Goals (Current goals can be found in the Care Plan section)  Acute Rehab PT Goals Patient Stated Goal: Return Home PT Goal Formulation: With patient/family Time For Goal Achievement: 12/09/23 Potential to Achieve Goals: Good    Frequency Min 2X/week     Co-evaluation               AM-PAC PT "6 Clicks" Mobility  Outcome Measure Help needed turning from your back to your side while in a flat bed without using bedrails?: A Little Help needed moving from lying on your back to sitting on the side of a flat bed without using bedrails?: A Little Help needed moving to and from a bed to a chair (including a wheelchair)?: A Little Help needed standing up from a chair using your arms (e.g., wheelchair or bedside chair)?: A Little Help needed to walk in hospital room?: A Little Help needed climbing 3-5 steps with a railing? : A Lot 6 Click Score: 17    End of Session Equipment Utilized During Treatment: Gait belt Activity Tolerance: Patient tolerated treatment well Patient left: in chair;with call bell/phone within reach;with chair alarm set;with family/visitor present Nurse Communication: Mobility status;Other (comment) (BP and HR response to session) PT Visit Diagnosis: Difficulty in walking, not elsewhere classified (R26.2);Unsteadiness on feet (R26.81);Other abnormalities of gait and mobility (R26.89);History of falling (Z91.81)    Time: 1610-9604 PT Time Calculation (min) (ACUTE ONLY): 31 min   Charges:   PT Evaluation $PT Eval Moderate Complexity: 1 Mod PT Treatments $Gait Training: 8-22 mins PT General Charges $$ ACUTE PT VISIT: 1 Visit         Glenford Lanes, PT, DPT Acute Rehabilitation Services Office: [phone] Secure Chat Preferred  Riva Chester 11/25/2023, 11:53 AM

## 2023-11-26 LAB — CULTURE, BLOOD (ROUTINE X 2)
Culture  Setup Time: NO GROWTH — AB
Special Requests: ADEQUATE

## 2023-11-28 DIAGNOSIS — E876 Hypokalemia: Secondary | ICD-10-CM | POA: Diagnosis not present

## 2023-11-28 DIAGNOSIS — I483 Typical atrial flutter: Secondary | ICD-10-CM | POA: Diagnosis not present

## 2023-11-28 DIAGNOSIS — M542 Cervicalgia: Secondary | ICD-10-CM | POA: Diagnosis not present

## 2023-11-28 DIAGNOSIS — R300 Dysuria: Secondary | ICD-10-CM | POA: Diagnosis not present

## 2023-11-28 DIAGNOSIS — K824 Cholesterolosis of gallbladder: Secondary | ICD-10-CM | POA: Diagnosis not present

## 2023-11-28 DIAGNOSIS — Z09 Encounter for follow-up examination after completed treatment for conditions other than malignant neoplasm: Secondary | ICD-10-CM | POA: Diagnosis not present

## 2023-11-28 LAB — CULTURE, BLOOD (ROUTINE X 2)
Culture: NO GROWTH
Special Requests: ADEQUATE

## 2023-12-04 DIAGNOSIS — R600 Localized edema: Secondary | ICD-10-CM | POA: Diagnosis not present

## 2023-12-04 DIAGNOSIS — Z09 Encounter for follow-up examination after completed treatment for conditions other than malignant neoplasm: Secondary | ICD-10-CM | POA: Diagnosis not present

## 2023-12-04 DIAGNOSIS — I483 Typical atrial flutter: Secondary | ICD-10-CM | POA: Diagnosis not present

## 2023-12-04 DIAGNOSIS — T887XXA Unspecified adverse effect of drug or medicament, initial encounter: Secondary | ICD-10-CM | POA: Diagnosis not present

## 2023-12-11 DIAGNOSIS — D696 Thrombocytopenia, unspecified: Secondary | ICD-10-CM | POA: Diagnosis not present

## 2023-12-11 DIAGNOSIS — R600 Localized edema: Secondary | ICD-10-CM | POA: Diagnosis not present

## 2023-12-11 DIAGNOSIS — E876 Hypokalemia: Secondary | ICD-10-CM | POA: Diagnosis not present

## 2023-12-11 LAB — LAB REPORT - SCANNED: EGFR: 91

## 2023-12-13 ENCOUNTER — Ambulatory Visit: Attending: Nurse Practitioner | Admitting: Home Health

## 2023-12-13 ENCOUNTER — Encounter: Payer: Self-pay | Admitting: Nurse Practitioner

## 2023-12-13 VITALS — BP 138/62 | HR 64 | Ht 70.0 in | Wt 151.2 lb

## 2023-12-13 DIAGNOSIS — I483 Typical atrial flutter: Secondary | ICD-10-CM

## 2023-12-13 DIAGNOSIS — I10 Essential (primary) hypertension: Secondary | ICD-10-CM

## 2023-12-13 DIAGNOSIS — I480 Paroxysmal atrial fibrillation: Secondary | ICD-10-CM | POA: Diagnosis not present

## 2023-12-13 DIAGNOSIS — I700 Atherosclerosis of aorta: Secondary | ICD-10-CM

## 2023-12-13 DIAGNOSIS — F1010 Alcohol abuse, uncomplicated: Secondary | ICD-10-CM

## 2023-12-13 DIAGNOSIS — R296 Repeated falls: Secondary | ICD-10-CM | POA: Diagnosis not present

## 2023-12-13 DIAGNOSIS — I2510 Atherosclerotic heart disease of native coronary artery without angina pectoris: Secondary | ICD-10-CM | POA: Diagnosis not present

## 2023-12-13 NOTE — Progress Notes (Addendum)
 Cardiology Office Note   Date:  12/13/2023  ID:  Gabriel Everett, Gabriel Everett [birth year], MRN [id number] PCP: [name], FNP  [facility] Providers Cardiologist:  New to Dr Delford (remotely saw Dr Mona and Dr Kelsie)  History of Present Illness Gabriel Everett is a 78 y.o. male with past medical history of alcohol  abuse with withdrawal DTs, falls, hypertension, paroxysmal atrial fibrillation/flutter, who is here for posthospitalization follow-up.   Historically he followed Dr. Mona in 2014, found to have atrial flutter for which he had spontaneous conversion to sinus rhythm on the Cardizem .  Echo from 2014 with LVEF 55 to 60%, normal wall motion, no significant structural abnormality.  He was referred to Dr. Kelsie 2015 for typical atrial flutter, ablation was offered, he declined.  He was noted hypertensive and started lisinopril  5 mg for blood pressure control at that time.  He was seen at ED 04/2017 by Dr. Lonni for heart palpitations, telemetry revealed atrial fibrillation, felt related to stress and alcohol  consumption, he preferred rate control strategy, discharged on diltiazem  180 mg and Eliquis  5 mg twice daily.  He followed up with A-fib clinic 05/01/2018, cardioversion was offered for atrial flutter.  Eliquis  was changed to Xarelto  and diltiazem  was changed to metoprolol  at subsequent follow-up.  He was ultimately referred back to Dr. Kelsie for ablation 05/2017 and he had agreed.  He supposed to call the office when he is ready to schedule but lost to follow up since.   He was most recently hospitalized from 11/23/2023 to 11/25/2023 for SIRS, tachycardia concerning for SVT versus paroxysmal a flutter /fib with RVR, frequent falls with trauma to head/face.  Etiology of SIRS (tachycardia, leukopenia, lactic acidosis) was not clear, he was treated with IV fluids and antibiotics, 1/2 blood cultures grew micrococcus, which was felt due to contamination.  Hospital course was  complicated by alcohol  withdrawal with delirium, transaminitis, hypokalemia, hypomagnesemia, and thrombocytopenia. Cardiology was consulted for intermittent tachycardia that was suspected to be SVT versus rapid A flutter/fib. He had stopped beta blocker and anticoagulation PTA for remote A fib/flutter. He was recommend DOAC for anticoagulation but refused, this was felt likely best option given ongoing ETOH use and withdrawals and falls. He had good response to IV diltiazem  and was discharged on PO diltiazem  240mg  daily.  Echocardiogram 11/24/2023 with LVEF 50 to 65%, global hypokinesis, grade 1 DD, normal RV, mild LAE, aortic sclerosis.  Today he came in by himself, states he is feeling well. He was quite upset about his recent hospitalizations. He states he tripped over something at home and fell. His neighbor told him to get a CT scan of his head, therefore he went to the ER.  ER did extensive workup on him with blood work and EKG. He was upset because the hospitalization delayed his work.  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  He states he has reduced alcohol  consumption a lot over the years, states he only drinks occasionally, liquor or beer, 3-4 times a week.  He admits having hand tremors and increased anxiety in the morning or when he does not drink alcohol , typically will add liquor into his orange juice to help with these symptoms, but felt he can function well without problems. He does not believes he drinks too much or if alcohol  is an issue for him. He states he does not care for anticoagulation medication, it made him feel poor. He stopped Cardizem  which made him feel drunk and he had leg swelling. Symptoms resolved after  he stops Cardizem . He checks his VS at home with BP cuff and apple watch, HR typically is around 60s, BP 120-140 systolic. He states he takes propranolol 20 to 40mg  as needed at home if BP is elevated or when his hand tremor/anxiety is bad. He had a lot of supply of propranolol left from the  past and does not need refill. He noted his BP sometimes run low if he takes propranolol 40mg . He does not want repeat cardiac monitor, states it did not catch nothing in the past. He continue to refuse anticoagulation. He felt he could tolerate propranolol if this is prescribed for his heart rate and HTN. He states he eats poorly ever since he lost his wife. He felt he is under a lot of stress due to loss of his wife. He denied any chest pain, palpitation, SOB, orthopnea, PND, leg edema.    ROS:  Constitutional: Denied fever, chills Eyes: Denied vision change or loss Ears/Nose/Mouth/Throat: Denied ear ache, sore throat, coughing, sinus pain Cardiovascular: denied chest pain/pressure Respiratory: denied shortness of breath Gastrointestinal: Denied nausea, vomiting, abdominal pain, diarrhea Genital/Urinary: Denied dysuria, hematuria, urinary frequency/urgency Musculoskeletal: Denied muscle ache, joint pain, weakness Skin: Denied rash, wound Neuro: Denied headache, dizziness, syncope Psych: anxiety  Endocrine: Denied history of diabetes   Studies Reviewed EKG Interpretation Date/Time:  Thursday December 13 2023 14:03:56 EDT Ventricular Rate:  54 PR Interval:  174 QRS Duration:  92 QT Interval:  464 QTC Calculation: 440 R Axis:   -56  Text Interpretation: Sinus bradycardia Left anterior fascicular block Nonspecific ST abnormality When compared with ECG of 22-Nov-2023 20:45, PREVIOUS ECG IS PRESENT Confirmed by Maryelizabeth Sos [id number]) on 12/13/2023 2:06:53 PM    Echo from 11/24/23:  1. Global hypokinesis abnormal septal motion . Left ventricular ejection  fraction, by estimation, is 50 to 55%. The left ventricle has low normal  function. The left ventricle demonstrates global hypokinesis. Left  ventricular diastolic parameters are  consistent with Grade I diastolic dysfunction (impaired relaxation). The  average left ventricular global longitudinal strain is -15.8 %. The global  longitudinal  strain is normal.   2. Right ventricular systolic function is normal. The right ventricular  size is normal.   3. Left atrial size was mildly dilated.   4. The mitral valve is abnormal. No evidence of mitral valve  regurgitation. No evidence of mitral stenosis.   5. The aortic valve is tricuspid. There is mild calcification of the  aortic valve. There is mild thickening of the aortic valve. Aortic valve  regurgitation is not visualized. Aortic valve sclerosis is present, with  no evidence of aortic valve stenosis.   6. The inferior vena cava is normal in size with greater than 50%  respiratory variability, suggesting right atrial pressure of 3 mmHg.     Risk Assessment/Calculations  CHA2DS2-VASc Score = 4   This indicates a 4.8% annual risk of stroke. The patient's score is based upon: CHF History: 0 HTN History: 1 Diabetes History: 0 Stroke History: 0 Vascular Disease History: 1 Age Score: 2 Gender Score: 0        Physical Exam VS:  BP 138/62 (BP Location: Left Arm, Patient Position: Sitting, Cuff Size: Normal)   Pulse 64   Ht 5' 10 (1.778 m)   Wt 151 lb 3.2 oz (68.6 kg)   SpO2 97%   BMI 21.69 kg/m        Wt Readings from Last 3 Encounters:  12/13/23 151 lb 3.2 oz (68.6 kg)  11/23/23 146 lb 8 oz (66.5 kg)  08/09/22 163 lb 9.6 oz (74.2 kg)    GEN: Well nourished, well developed, appears younger than his age  NECK: No JVD CARDIAC: RRR, bradycardiac, no murmurs RESPIRATORY:  Clear to auscultation without rales, wheezing or rhonchi, on room air, speaks at ease  ABDOMEN: Soft, non-tender, non-distended EXTREMITIES:  No leg edema   ASSESSMENT AND PLAN  Narrow complex tachycardia Paroxysmal atrial flutter/fibrillation initially diagnosed 2014 - Recent hospitalization records reviewed: EKG showed narrow complex tachycardia concerning for atrial flutter/fibrillation RVR, SVT difficult to rule out; TSH WNL; Hs trop negative x2, UDS and alcohol  negative;  low K and  Mag, lactic acid 7 >3.9; Echo with LVEF 50 to 55%, global hypokinesis, grade 1 DD, normal RV, mild LAE, aortic sclerosis; arrhythmia is likely mediated by alcoholic ketoacidosis  - EKG today showed sinus bradycardia 54bpm  - He has no typical symptoms from atrial flutter in the past or during recent hospitalization  - Anticoagulation was recommended for CHA2DS2-VASc score of 4, risk of bleeding high with continued alcohol  consumption with withdrawals and falls, patient ultimately refused and continue to refuse anticoagulation, this is probably safe; shall lifestyle changes in the future, would reconsider start anticoagulation for CVA prophylaxis, he understands the risk  - Tachycardia responded well to IV Cardizem  and he was discharged on diltiazem  CD 240 mg daily, he has not been taking it since discharge due to leg swelling and feeling drunk, takes PRN propranolol at home when BP is elevated and able to tolerate, as this also helps him with anxiety and tremor, will continue propranolol 20 mg daily (states 40mg  drops his BP too much) for rate control  - Discussed alcohol  moderation, cessation if possible, he seems to have limited insight  - Will hold off referring to EP for rhythm strategy given active ETOH use, refusing anticoagulation, and hx of recurrent decline of ablation /drugs   Hypertension - Blood pressure is 138/62 today, discharged on diltiazem  as mentioned above but he has not been taking it, takes propranolol 20 to 40 mg as needed at home for elevated blood pressure  - Advised patient to continue monitoring his heart rate and blood pressure, bring logs to the next visit  - Will stop diltiazem  CD as he is not able to tolerate side effect, resume previous medications propranolol at 20 mg once a day (what he agrees to take), will titrate medication based off his blood pressure and heart rate log at next visit   ETOH abuse - he is not forthcoming with ETOH use quantity, clearly reports  withdrawal symptoms - noted recent hospital labs with electrolyte deficiency, transaminitis, and thrombocytopenia, repeat CMP and CBC by PCP ([facility]) from 12/11/23 reviewed, WBC 5100, Hgb 13.2, PLT 249k; Mag 1.6, K 3.7, renal index WNL,  transaminitis had resolved, albumin low at 3.3; overall good results  - recommend continue lifelong folic acid  and thiamine  - recommended alcohol  moderation or cessation if possible     Dispo: Follow up with Dr Delford in 3 months , has no established primary cardiologist at this time      Signed, Alaena Strader, NP

## 2023-12-13 NOTE — Progress Notes (Deleted)
 Office Visit    Patient Name: Gabriel Everett Date of Encounter: 12/13/2023  Primary Care Provider:  [name], FNP Primary Cardiologist:  None  Chief Complaint    78 year old male with a history of coronary artery calcification noted on prior CT, aortic atherosclerosis, paroxysmal atrial fibrillation/atrial flutter, PVCs, hypertension, recurrent falls, and ETOH abuse who presents for hospital follow-up related to tachycardia.  Past Medical History    Past Medical History:  Diagnosis Date   Allergy    Seasonal   Atrial flutter with rapid ventricular response (HCC) 06/18/13   Dizziness 06/18/13   due to inner ear infection   Elevated blood pressure    elevated in the office 07/14/13 though he does not carry a diagnosis of HTN   History of colonic diverticulitis 2010   Right-sided.   PVC's (premature ventricular contractions)    By monitor 05/22/13   Past Surgical History:  Procedure Laterality Date   none      Allergies  No Known Allergies   Labs/Other Studies Reviewed    The following studies were reviewed today:  Cardiac Studies & Procedures   ______________________________________________________________________________________________     ECHOCARDIOGRAM  ECHOCARDIOGRAM COMPLETE 11/24/2023  Narrative ECHOCARDIOGRAM REPORT    Patient Name:   Gabriel Everett Date of Exam: 11/24/2023 Medical Rec #:  [id number]         Height:       70.0 in Accession #:    [id number]        Weight:       146.5 lb Date of Birth:  April 27, 1946         BSA:          1.829 m Patient Age:    78 years          BP:           123/81 mmHg Patient Gender: M                 HR:           79 bpm. Exam Location:  Inpatient  Procedure: 2D Echo, 3D Echo, Cardiac Doppler, Color Doppler and Strain Analysis (Both Spectral and Color Flow Doppler were utilized during procedure).  Indications:    Abnormal ECG  History:        Patient has prior history of Echocardiogram examinations,  most recent 06/09/2013. Arrythmias:Atrial Flutter and PVC, Signs/Symptoms:Dizziness/Lightheadedness; Risk Factors:Hypertension.  Sonographer:    Logan Shove RDCS Referring Phys: [id number] RONDELL A SMITH  IMPRESSIONS   1. Global hypokinesis abnormal septal motion . Left ventricular ejection fraction, by estimation, is 50 to 55%. The left ventricle has low normal function. The left ventricle demonstrates global hypokinesis. Left ventricular diastolic parameters are consistent with Grade I diastolic dysfunction (impaired relaxation). The average left ventricular global longitudinal strain is -15.8 %. The global longitudinal strain is normal. 2. Right ventricular systolic function is normal. The right ventricular size is normal. 3. Left atrial size was mildly dilated. 4. The mitral valve is abnormal. No evidence of mitral valve regurgitation. No evidence of mitral stenosis. 5. The aortic valve is tricuspid. There is mild calcification of the aortic valve. There is mild thickening of the aortic valve. Aortic valve regurgitation is not visualized. Aortic valve sclerosis is present, with no evidence of aortic valve stenosis. 6. The inferior vena cava is normal in size with greater than 50% respiratory variability, suggesting right atrial pressure of 3 mmHg.  FINDINGS Left Ventricle: Global hypokinesis abnormal septal motion. Left  ventricular ejection fraction, by estimation, is 50 to 55%. The left ventricle has low normal function. The left ventricle demonstrates global hypokinesis. The average left ventricular global longitudinal strain is -15.8 %. Strain was performed and the global longitudinal strain is normal. The left ventricular internal cavity size was normal in size. There is no left ventricular hypertrophy. Left ventricular diastolic parameters are consistent with Grade I diastolic dysfunction (impaired relaxation).  Right Ventricle: The right ventricular size is normal. No increase in right  ventricular wall thickness. Right ventricular systolic function is normal.  Left Atrium: Left atrial size was mildly dilated.  Right Atrium: Right atrial size was normal in size.  Pericardium: There is no evidence of pericardial effusion.  Mitral Valve: The mitral valve is abnormal. There is mild thickening of the mitral valve leaflet(s). There is mild calcification of the mitral valve leaflet(s). Mild mitral annular calcification. No evidence of mitral valve regurgitation. No evidence of mitral valve stenosis.  Tricuspid Valve: The tricuspid valve is normal in structure. Tricuspid valve regurgitation is not demonstrated. No evidence of tricuspid stenosis.  Aortic Valve: The aortic valve is tricuspid. There is mild calcification of the aortic valve. There is mild thickening of the aortic valve. Aortic valve regurgitation is not visualized. Aortic valve sclerosis is present, with no evidence of aortic valve stenosis. Aortic valve mean gradient measures 2.0 mmHg. Aortic valve peak gradient measures 2.8 mmHg. Aortic valve area, by VTI measures 4.21 cm.  Pulmonic Valve: The pulmonic valve was normal in structure. Pulmonic valve regurgitation is not visualized. No evidence of pulmonic stenosis.  Aorta: The aortic root is normal in size and structure.  Venous: The inferior vena cava is normal in size with greater than 50% respiratory variability, suggesting right atrial pressure of 3 mmHg.  IAS/Shunts: No atrial level shunt detected by color flow Doppler.  Additional Comments: 3D was performed not requiring image post processing on an independent workstation and was abnormal.   LEFT VENTRICLE PLAX 2D LVIDd:         4.70 cm   Diastology LVIDs:         3.30 cm   LV e' medial:    7.51 cm/s LV PW:         1.40 cm   LV E/e' medial:  6.3 LV IVS:        1.10 cm   LV e' lateral:   8.38 cm/s LVOT diam:     2.40 cm   LV E/e' lateral: 5.6 LV SV:         73 LV SV Index:   40        2D Longitudinal  Strain LVOT Area:     4.52 cm  2D Strain GLS Avg:     -15.8 %  3D Volume EF: 3D EF:        45 % LV EDV:       146 ml LV ESV:       79 ml LV SV:        66 ml  RIGHT VENTRICLE             IVC RV Basal diam:  3.10 cm     IVC diam: 1.10 cm RV S prime:     15.20 cm/s TAPSE (M-mode): 1.8 cm  LEFT ATRIUM             Index        RIGHT ATRIUM           Index LA  diam:        3.80 cm 2.08 cm/m   RA Area:     12.80 cm LA Vol (A2C):   52.0 ml 28.44 ml/m  RA Volume:   26.70 ml  14.60 ml/m LA Vol (A4C):   24.9 ml 13.62 ml/m LA Biplane Vol: 36.5 ml 19.96 ml/m AORTIC VALVE AV Area (Vmax):    4.29 cm AV Area (Vmean):   4.13 cm AV Area (VTI):     4.21 cm AV Vmax:           83.40 cm/s AV Vmean:          56.800 cm/s AV VTI:            0.173 m AV Peak Grad:      2.8 mmHg AV Mean Grad:      2.0 mmHg LVOT Vmax:         79.10 cm/s LVOT Vmean:        51.900 cm/s LVOT VTI:          0.161 m LVOT/AV VTI ratio: 0.93  AORTA Ao Root diam: 3.20 cm Ao Asc diam:  3.20 cm  MITRAL VALVE MV Area (PHT): 3.42 cm    SHUNTS MV Decel Time: 222 msec    Systemic VTI:  0.16 m MV E velocity: 47.20 cm/s  Systemic Diam: 2.40 cm MV A velocity: 82.80 cm/s MV E/A ratio:  0.57  Maude Emmer MD Electronically signed by Maude Emmer MD Signature Date/Time: 11/24/2023/3:32:33 PM    Final          ______________________________________________________________________________________________     Recent Labs: 11/22/2023: ALT 61; TSH 2.290 11/24/2023: BUN 6; Creatinine, Ser 1.05; Hemoglobin 12.4; Magnesium  1.8; Platelets 114; Potassium 3.2; Sodium 136  Recent Lipid Panel    Component Value Date/Time   CHOL 153 11/24/2023 0355   TRIG 114 11/24/2023 0355   HDL 52 11/24/2023 0355   CHOLHDL 2.9 11/24/2023 0355   VLDL 23 11/24/2023 0355   LDLCALC 78 11/24/2023 0355    History of Present Illness    78 year old male with the above past medical history including coronary artery calcification noted on  prior CT, aortic atherosclerosis, paroxysmal atrial fibrillation/atrial flutter, PVCs, hypertension, recurrent falls, and EtOH abuse.  He has a history of paroxysmal atrial fibrillation/atrial flutter.  Previously on anticoagulation, previously followed by Dr. Kelsie and the A-fib clinic, last seen in 2018.  He presented to the ED on 11/22/2023 in the setting of lightheadedness, frequent falls.  CT of the chest was negative for PE.  CT of the head was unremarkable. He was tachycardic upon arrival (SVT versus atrial fibrillation/flutter with RVR). Cardiology was consulted.  He was placed on diltiazem  infusion with improvement in heart rate, and also currently transitioned to p.o. diltiazem .  He declined anticoagulation (this was noted to be reasonable in the setting of EtOH abuse, frequent falls).  He was also noted to be tachypneic, with leukopenia, elevated lactic acid.  He was treated empirically with antibiotics.  He experienced delirium in the setting of EtOH withdrawal.  He was discharged home in stable condition on 11/25/2023 with home health PT.  He presents today for follow-up.  Since his hospitalization  Paroxysmal atrial fibrillation/atrial flutter/tachycardia: Coronary artery calcification/aortic atherosclerosis: Noted on prior CT. Hypertension: Recurrent falls: EtOH abuse: Disposition:  Home Medications    Current Outpatient Medications  Medication Sig Dispense Refill   cephALEXin (KEFLEX) 500 MG capsule Take 500 mg by mouth every 12 (twelve) hours as needed (for infections,allergies  per patient).     diltiazem  (CARDIZEM  CD) 240 MG 24 hr capsule Take 1 capsule (240 mg total) by mouth daily. 90 capsule 0   ibuprofen  (ADVIL ,MOTRIN ) 400 MG tablet Take 1 tablet (400 mg total) by mouth every 6 (six) hours as needed. (Patient taking differently: Take 400 mg by mouth every 6 (six) hours as needed for mild pain (pain score 1-3).) 30 tablet 0   No current facility-administered medications for  this visit.     Review of Systems    ***.  All other systems reviewed and are otherwise negative except as noted above.    Physical Exam    VS:  There were no vitals taken for this visit. , BMI There is no height or weight on file to calculate BMI.     GEN: Well nourished, well developed, in no acute distress. HEENT: normal. Neck: Supple, no JVD, carotid bruits, or masses. Cardiac: RRR, no murmurs, rubs, or gallops. No clubbing, cyanosis, edema.  Radials/DP/PT 2+ and equal bilaterally.  Respiratory:  Respirations regular and unlabored, clear to auscultation bilaterally. GI: Soft, nontender, nondistended, BS + x 4. MS: no deformity or atrophy. Skin: warm and dry, no rash. Neuro:  Strength and sensation are intact. Psych: Normal affect.  Accessory Clinical Findings    ECG personally reviewed by me today -    - no acute changes.   Lab Results  Component Value Date   WBC 7.0 11/24/2023   HGB 12.4 (L) 11/24/2023   HCT 35.0 (L) 11/24/2023   MCV 102.0 (H) 11/24/2023   PLT 114 (L) 11/24/2023   Lab Results  Component Value Date   CREATININE 1.05 11/24/2023   BUN 6 (L) 11/24/2023   NA 136 11/24/2023   K 3.2 (L) 11/24/2023   CL 106 11/24/2023   CO2 17 (L) 11/24/2023   Lab Results  Component Value Date   ALT 61 (H) 11/22/2023   AST 109 (H) 11/22/2023   ALKPHOS 54 11/22/2023   BILITOT 1.1 11/22/2023   Lab Results  Component Value Date   CHOL 153 11/24/2023   HDL 52 11/24/2023   LDLCALC 78 11/24/2023   TRIG 114 11/24/2023   CHOLHDL 2.9 11/24/2023    Lab Results  Component Value Date   HGBA1C 4.6 (L) 11/23/2023    Assessment & Plan    1.  ***  No BP recorded.  {Refresh Note OR Click here to enter BP  :1}***   Gabriel JAYSON Braver, NP 12/13/2023, 6:58 AM

## 2023-12-13 NOTE — Patient Instructions (Signed)
 Medication Instructions:  Please start Propranolol 20 mg daily  *If you need a refill on your cardiac medications before your next appointment, please call your pharmacy*  Lab Work: NONE ordered at this time of appointment   Testing/Procedures: NONE ordered at this time of appointment   Follow-Up: At [facility], you and your health needs are our priority.  As part of our continuing mission to provide you with exceptional heart care, our providers are all part of one team.  This team includes your primary Cardiologist (physician) and Advanced Practice Providers or APPs (Physician Assistants and Nurse Practitioners) who all work together to provide you with the care you need, when you need it.  Your next appointment:   3 month(s)  Provider:   Dr. Sydney MD only     We recommend signing up for the patient portal called MyChart.  Sign up information is provided on this After Visit Summary.  MyChart is used to connect with patients for Virtual Visits (Telemedicine).  Patients are able to view lab/test results, encounter notes, upcoming appointments, etc.  Non-urgent messages can be sent to your provider as well.   To learn more about what you can do with MyChart, go to [email or web address].

## 2023-12-14 ENCOUNTER — Ambulatory Visit: Admitting: Emergency Medicine

## 2024-02-04 DIAGNOSIS — K529 Noninfective gastroenteritis and colitis, unspecified: Secondary | ICD-10-CM | POA: Diagnosis not present

## 2024-02-04 DIAGNOSIS — J069 Acute upper respiratory infection, unspecified: Secondary | ICD-10-CM | POA: Diagnosis not present

## 2024-02-04 DIAGNOSIS — R112 Nausea with vomiting, unspecified: Secondary | ICD-10-CM | POA: Diagnosis not present

## 2024-03-19 ENCOUNTER — Telehealth: Payer: Self-pay

## 2024-03-19 ENCOUNTER — Ambulatory Visit: Admitting: Cardiovascular Disease

## 2024-03-19 NOTE — Telephone Encounter (Signed)
 Number for patient is not working. Called and spoke with grandson and informed him patient will need to reschedule appt to see Dr. Lonni instead of Dr. Delford.  Darden Motto states he will call and let patient know on his lunch break today. Provided office number for patient to call to reschedule appt.  Motto provided number to patient's shop: [phone]. I called this number and it rang with no answer, unable to leave message.  Appt will be cancelled for today. To be rescheduled with Dr. Lonni.

## 2024-03-27 ENCOUNTER — Telehealth (HOSPITAL_BASED_OUTPATIENT_CLINIC_OR_DEPARTMENT_OTHER): Payer: Self-pay | Admitting: Cardiology

## 2024-03-27 NOTE — Telephone Encounter (Signed)
 Patient contacted 3x to r/s but unable to speak with patient to do so. Will send letter.

## 2024-03-27 NOTE — Telephone Encounter (Signed)
-----   Message from Maude Emmer sent at 03/07/2024 12:42 PM EDT ----- This is Dr Korene patient seen in consult hospital 11/23/23 please take off my schedule 10/1 and have him f/u with her

## 2024-04-30 DIAGNOSIS — I498 Other specified cardiac arrhythmias: Secondary | ICD-10-CM | POA: Diagnosis not present

## 2024-04-30 DIAGNOSIS — A084 Viral intestinal infection, unspecified: Secondary | ICD-10-CM | POA: Diagnosis not present

## 2024-04-30 DIAGNOSIS — E860 Dehydration: Secondary | ICD-10-CM | POA: Diagnosis not present

## 2024-04-30 DIAGNOSIS — R1110 Vomiting, unspecified: Secondary | ICD-10-CM | POA: Diagnosis not present

## 2024-07-06 ENCOUNTER — Inpatient Hospital Stay (HOSPITAL_COMMUNITY)
Admission: EM | Admit: 2024-07-06 | Discharge: 2024-07-16 | DRG: 682 | Disposition: A | Discharge status: Skilled Nursing Facility | Attending: Internal Medicine | Admitting: Internal Medicine

## 2024-07-06 ENCOUNTER — Emergency Department (HOSPITAL_COMMUNITY)

## 2024-07-06 ENCOUNTER — Inpatient Hospital Stay (HOSPITAL_COMMUNITY)

## 2024-07-06 ENCOUNTER — Encounter (HOSPITAL_COMMUNITY): Payer: Self-pay | Admitting: Internal Medicine

## 2024-07-06 ENCOUNTER — Other Ambulatory Visit: Payer: Self-pay

## 2024-07-06 DIAGNOSIS — E872 Acidosis, unspecified: Secondary | ICD-10-CM | POA: Diagnosis present

## 2024-07-06 DIAGNOSIS — K760 Fatty (change of) liver, not elsewhere classified: Secondary | ICD-10-CM | POA: Diagnosis present

## 2024-07-06 DIAGNOSIS — L89326 Pressure-induced deep tissue damage of left buttock: Secondary | ICD-10-CM | POA: Diagnosis not present

## 2024-07-06 DIAGNOSIS — G9340 Encephalopathy, unspecified: Secondary | ICD-10-CM | POA: Diagnosis present

## 2024-07-06 DIAGNOSIS — E860 Dehydration: Secondary | ICD-10-CM | POA: Diagnosis present

## 2024-07-06 DIAGNOSIS — Z1152 Encounter for screening for COVID-19: Secondary | ICD-10-CM | POA: Diagnosis not present

## 2024-07-06 DIAGNOSIS — E8729 Other acidosis: Secondary | ICD-10-CM | POA: Diagnosis not present

## 2024-07-06 DIAGNOSIS — I10 Essential (primary) hypertension: Secondary | ICD-10-CM | POA: Diagnosis present

## 2024-07-06 DIAGNOSIS — L89316 Pressure-induced deep tissue damage of right buttock: Secondary | ICD-10-CM | POA: Diagnosis not present

## 2024-07-06 DIAGNOSIS — N390 Urinary tract infection, site not specified: Secondary | ICD-10-CM | POA: Diagnosis present

## 2024-07-06 DIAGNOSIS — I480 Paroxysmal atrial fibrillation: Secondary | ICD-10-CM | POA: Diagnosis present

## 2024-07-06 DIAGNOSIS — W19XXXA Unspecified fall, initial encounter: Secondary | ICD-10-CM | POA: Diagnosis present

## 2024-07-06 DIAGNOSIS — R569 Unspecified convulsions: Secondary | ICD-10-CM | POA: Diagnosis not present

## 2024-07-06 DIAGNOSIS — A419 Sepsis, unspecified organism: Principal | ICD-10-CM

## 2024-07-06 DIAGNOSIS — F10931 Alcohol use, unspecified with withdrawal delirium: Secondary | ICD-10-CM | POA: Diagnosis not present

## 2024-07-06 DIAGNOSIS — T796XXA Traumatic ischemia of muscle, initial encounter: Secondary | ICD-10-CM | POA: Diagnosis present

## 2024-07-06 DIAGNOSIS — R4182 Altered mental status, unspecified: Secondary | ICD-10-CM | POA: Diagnosis not present

## 2024-07-06 DIAGNOSIS — R6510 Systemic inflammatory response syndrome (SIRS) of non-infectious origin without acute organ dysfunction: Secondary | ICD-10-CM | POA: Diagnosis present

## 2024-07-06 DIAGNOSIS — R296 Repeated falls: Secondary | ICD-10-CM | POA: Diagnosis present

## 2024-07-06 DIAGNOSIS — E876 Hypokalemia: Secondary | ICD-10-CM | POA: Diagnosis present

## 2024-07-06 DIAGNOSIS — D696 Thrombocytopenia, unspecified: Secondary | ICD-10-CM | POA: Diagnosis present

## 2024-07-06 DIAGNOSIS — I951 Orthostatic hypotension: Secondary | ICD-10-CM | POA: Diagnosis present

## 2024-07-06 DIAGNOSIS — R680 Hypothermia, not associated with low environmental temperature: Secondary | ICD-10-CM | POA: Diagnosis present

## 2024-07-06 DIAGNOSIS — F05 Delirium due to known physiological condition: Secondary | ICD-10-CM | POA: Diagnosis not present

## 2024-07-06 DIAGNOSIS — R1310 Dysphagia, unspecified: Secondary | ICD-10-CM | POA: Diagnosis present

## 2024-07-06 DIAGNOSIS — N179 Acute kidney failure, unspecified: Principal | ICD-10-CM | POA: Diagnosis present

## 2024-07-06 DIAGNOSIS — R636 Underweight: Secondary | ICD-10-CM | POA: Diagnosis present

## 2024-07-06 DIAGNOSIS — Z6822 Body mass index (BMI) 22.0-22.9, adult: Secondary | ICD-10-CM

## 2024-07-06 DIAGNOSIS — Z888 Allergy status to other drugs, medicaments and biological substances status: Secondary | ICD-10-CM

## 2024-07-06 DIAGNOSIS — F1090 Alcohol use, unspecified, uncomplicated: Secondary | ICD-10-CM | POA: Diagnosis present

## 2024-07-06 DIAGNOSIS — Z781 Physical restraint status: Secondary | ICD-10-CM

## 2024-07-06 DIAGNOSIS — F10139 Alcohol abuse with withdrawal, unspecified: Secondary | ICD-10-CM | POA: Diagnosis not present

## 2024-07-06 DIAGNOSIS — K209 Esophagitis, unspecified without bleeding: Secondary | ICD-10-CM | POA: Diagnosis present

## 2024-07-06 DIAGNOSIS — G9341 Metabolic encephalopathy: Secondary | ICD-10-CM | POA: Diagnosis present

## 2024-07-06 DIAGNOSIS — Z79899 Other long term (current) drug therapy: Secondary | ICD-10-CM

## 2024-07-06 LAB — I-STAT CG4 LACTIC ACID, ED
Lactic Acid, Venous: 7.3 mmol/L (ref 0.5–1.9)
Lactic Acid, Venous: 5.6 mmol/L (ref 0.5–1.9)
Lactic Acid, Venous: 3.1 mmol/L (ref 0.5–1.9)

## 2024-07-06 LAB — I-STAT CHEM 8, ED
BUN: 40 mg/dL — ABNORMAL HIGH (ref 8–23)
Calcium, Ion: 0.9 mmol/L — ABNORMAL LOW (ref 1.15–1.40)
Chloride: 102 mmol/L (ref 98–111)
Creatinine, Ser: 3.1 mg/dL — ABNORMAL HIGH (ref 0.61–1.24)
Glucose, Bld: 82 mg/dL (ref 70–99)
HCT: 36 % — ABNORMAL LOW (ref 39.0–52.0)
Hemoglobin: 12.2 g/dL — ABNORMAL LOW (ref 13.0–17.0)
Potassium: 4.3 mmol/L (ref 3.5–5.1)
Sodium: 134 mmol/L — ABNORMAL LOW (ref 135–145)
TCO2: 13 mmol/L — ABNORMAL LOW (ref 22–32)

## 2024-07-06 LAB — CBC WITH DIFFERENTIAL/PLATELET
Abs Immature Granulocytes: 0.08 K/uL — ABNORMAL HIGH (ref 0.00–0.07)
Basophils Absolute: 0 K/uL (ref 0.0–0.1)
Basophils Relative: 0 %
Eosinophils Absolute: 0 K/uL (ref 0.0–0.5)
Eosinophils Relative: 0 %
HCT: 34.7 % — ABNORMAL LOW (ref 39.0–52.0)
Hemoglobin: 11.4 g/dL — ABNORMAL LOW (ref 13.0–17.0)
Immature Granulocytes: 1 %
Lymphocytes Relative: 7 %
Lymphs Abs: 0.7 K/uL (ref 0.7–4.0)
MCH: 38.8 pg — ABNORMAL HIGH (ref 26.0–34.0)
MCHC: 32.9 g/dL (ref 30.0–36.0)
MCV: 118 fL — ABNORMAL HIGH (ref 80.0–100.0)
Monocytes Absolute: 0.6 K/uL (ref 0.1–1.0)
Monocytes Relative: 6 %
Neutro Abs: 9.6 K/uL — ABNORMAL HIGH (ref 1.7–7.7)
Neutrophils Relative %: 86 %
Platelets: 108 K/uL — ABNORMAL LOW (ref 150–400)
RBC: 2.94 MIL/uL — ABNORMAL LOW (ref 4.22–5.81)
RDW: 13.5 % (ref 11.5–15.5)
WBC: 11 K/uL — ABNORMAL HIGH (ref 4.0–10.5)
nRBC: 0.2 % (ref 0.0–0.2)

## 2024-07-06 LAB — COMPREHENSIVE METABOLIC PANEL WITH GFR
ALT: 46 U/L — ABNORMAL HIGH (ref 0–44)
AST: 92 U/L — ABNORMAL HIGH (ref 15–41)
Albumin: 3.4 g/dL — ABNORMAL LOW (ref 3.5–5.0)
Alkaline Phosphatase: 63 U/L (ref 38–126)
Anion gap: 32 — ABNORMAL HIGH (ref 5–15)
BUN: 37 mg/dL — ABNORMAL HIGH (ref 8–23)
CO2: 10 mmol/L — ABNORMAL LOW (ref 22–32)
Calcium: 8 mg/dL — ABNORMAL LOW (ref 8.9–10.3)
Chloride: 94 mmol/L — ABNORMAL LOW (ref 98–111)
Creatinine, Ser: 2.74 mg/dL — ABNORMAL HIGH (ref 0.61–1.24)
GFR, Estimated: 23 mL/min — ABNORMAL LOW
Glucose, Bld: 84 mg/dL (ref 70–99)
Potassium: 4.5 mmol/L (ref 3.5–5.1)
Sodium: 135 mmol/L (ref 135–145)
Total Bilirubin: 3.3 mg/dL — ABNORMAL HIGH (ref 0.0–1.2)
Total Protein: 5.6 g/dL — ABNORMAL LOW (ref 6.5–8.1)

## 2024-07-06 LAB — PROTIME-INR
INR: 1.5 — ABNORMAL HIGH (ref 0.8–1.2)
Prothrombin Time: 18.9 s — ABNORMAL HIGH (ref 11.4–15.2)

## 2024-07-06 LAB — RESP PANEL BY RT-PCR (RSV, FLU A&B, COVID)  RVPGX2
Influenza A by PCR: NEGATIVE
Influenza B by PCR: NEGATIVE
Resp Syncytial Virus by PCR: NEGATIVE
SARS Coronavirus 2 by RT PCR: NEGATIVE

## 2024-07-06 LAB — ETHANOL: Alcohol, Ethyl (B): <15 mg/dL

## 2024-07-06 LAB — MAGNESIUM: Magnesium: 1.7 mg/dL (ref 1.7–2.4)

## 2024-07-06 LAB — LIPASE, BLOOD: Lipase: 48 U/L (ref 11–51)

## 2024-07-06 MED ORDER — LACTATED RINGERS IV BOLUS (SEPSIS): 1000.0000 mL | Freq: Once | INTRAVENOUS | Status: DC

## 2024-07-06 MED ORDER — FOLIC ACID 1 MG PO TABS
1.0000 mg | ORAL_TABLET | Freq: Every day | ORAL | Status: DC
  Administered 2024-07-09 – 2024-07-16 (×8): 1 mg via ORAL
  Filled 2024-07-06 (×9): qty 1

## 2024-07-06 MED ORDER — THIAMINE MONONITRATE 100 MG PO TABS
100.0000 mg | ORAL_TABLET | Freq: Every day | ORAL | Status: DC
  Administered 2024-07-09: 100 mg via ORAL
  Filled 2024-07-06 (×2): qty 1

## 2024-07-06 MED ORDER — THIAMINE HCL 100 MG/ML IJ SOLN
100.0000 mg | Freq: Every day | INTRAMUSCULAR | Status: DC
  Administered 2024-07-06: 100 mg via INTRAVENOUS
  Filled 2024-07-06: qty 2

## 2024-07-06 MED ORDER — LORAZEPAM 2 MG/ML IJ SOLN
1.0000 mg | Freq: Once | INTRAMUSCULAR | Status: AC | PRN
  Administered 2024-07-06: 1 mg via INTRAVENOUS

## 2024-07-06 MED ORDER — LACTATED RINGERS IV BOLUS (SEPSIS): 250.0000 mL | Freq: Once | INTRAVENOUS | Status: DC

## 2024-07-06 MED ORDER — HEPARIN SODIUM (PORCINE) 5000 UNIT/ML IJ SOLN
5000.0000 [IU] | Freq: Three times a day (TID) | INTRAMUSCULAR | Status: DC
  Administered 2024-07-06 – 2024-07-07 (×2): 5000 [IU] via SUBCUTANEOUS
  Filled 2024-07-06 (×2): qty 1

## 2024-07-06 MED ORDER — LORAZEPAM 2 MG/ML IJ SOLN
1.0000 mg | INTRAMUSCULAR | Status: DC | PRN
  Administered 2024-07-06 – 2024-07-07 (×4): 2 mg via INTRAVENOUS
  Administered 2024-07-07: 1 mg via INTRAVENOUS
  Administered 2024-07-08: 2 mg via INTRAVENOUS
  Filled 2024-07-06 (×5): qty 1

## 2024-07-06 MED ORDER — SODIUM CHLORIDE 0.9 % IV SOLN: 2.0000 g | Freq: Once | INTRAVENOUS | Status: DC

## 2024-07-06 MED ORDER — ACETAMINOPHEN 325 MG PO TABS
650.0000 mg | ORAL_TABLET | Freq: Four times a day (QID) | ORAL | Status: DC | PRN
  Administered 2024-07-13 – 2024-07-15 (×2): 650 mg via ORAL
  Filled 2024-07-06 (×5): qty 2

## 2024-07-06 MED ORDER — SODIUM CHLORIDE 0.9% FLUSH: 3.0000 mL | Freq: Two times a day (BID) | INTRAVENOUS | Status: DC

## 2024-07-06 MED ORDER — ADULT MULTIVITAMIN W/MINERALS CH
1.0000 | ORAL_TABLET | Freq: Every day | ORAL | Status: DC
  Administered 2024-07-09 – 2024-07-16 (×8): 1 via ORAL
  Filled 2024-07-06 (×9): qty 1

## 2024-07-06 MED ORDER — LACTATED RINGERS IV SOLN
150.0000 mL/h | INTRAVENOUS | Status: DC
  Administered 2024-07-06 – 2024-07-07 (×3): 150 mL/h via INTRAVENOUS

## 2024-07-06 MED ORDER — LACTATED RINGERS IV BOLUS (SEPSIS)
1000.0000 mL | Freq: Once | INTRAVENOUS | Status: AC
  Administered 2024-07-06: 1000 mL via INTRAVENOUS

## 2024-07-06 MED ORDER — SODIUM CHLORIDE 0.9 % IV SOLN: 2.0000 g | INTRAVENOUS | Status: DC

## 2024-07-06 MED ORDER — VANCOMYCIN VARIABLE DOSE PER UNSTABLE RENAL FUNCTION (PHARMACIST DOSING): Status: DC

## 2024-07-06 MED ORDER — VANCOMYCIN HCL 1500 MG/300ML IV SOLN
1500.0000 mg | Freq: Once | INTRAVENOUS | Status: AC
  Administered 2024-07-06: 1500 mg via INTRAVENOUS
  Filled 2024-07-06: qty 300

## 2024-07-06 MED ORDER — POLYETHYLENE GLYCOL 3350 17 G PO PACK: 17.0000 g | PACK | Freq: Every day | ORAL | Status: DC | PRN

## 2024-07-06 MED ORDER — LORAZEPAM 1 MG PO TABS: 1.0000 mg | ORAL_TABLET | ORAL | Status: DC | PRN

## 2024-07-06 MED ORDER — ACETAMINOPHEN 650 MG RE SUPP: 650.0000 mg | Freq: Four times a day (QID) | RECTAL | Status: DC | PRN

## 2024-07-06 MED ORDER — LACTATED RINGERS IV SOLN: INTRAVENOUS | Status: DC

## 2024-07-06 MED ORDER — LORAZEPAM 2 MG/ML IJ SOLN
2.0000 mg | INTRAMUSCULAR | Status: DC | PRN
  Filled 2024-07-06 (×2): qty 1

## 2024-07-06 MED ORDER — VANCOMYCIN HCL IN DEXTROSE 1-5 GM/200ML-% IV SOLN: 1000.0000 mg | Freq: Once | INTRAVENOUS | Status: DC

## 2024-07-06 MED ORDER — METRONIDAZOLE 500 MG/100ML IV SOLN
500.0000 mg | Freq: Two times a day (BID) | INTRAVENOUS | Status: DC
  Administered 2024-07-06 – 2024-07-08 (×4): 500 mg via INTRAVENOUS
  Filled 2024-07-06 (×4): qty 100

## 2024-07-06 NOTE — Progress Notes (Signed)
 Pharmacy Antibiotic Note  Gabriel Everett is a 79 y.o. male for which pharmacy has been consulted for cefepime  and vancomycin  dosing for sepsis.  Patient presenting with AMS and weakness.  SCr 3.1 - AKI WBC 11; LA 7.3; T 94.8; HR 54; RR 19  Plan: Metronidazole  per MD Cefepime  2g q24hr  Vancomycin  1500 mg once, subsequent dosing as indicated per random vancomycin  level until renal function stable and/or improved, at which time scheduled dosing can be considered Monitor WBC, fever, renal function, cultures De-escalate when able  Height: 5' 10 (177.8 cm) Weight: 70.3 kg (155 lb) IBW/kg (Calculated) : 73  Temp (24hrs), Avg:94.8 F (34.9 C), Min:94.8 F (34.9 C), Max:94.8 F (34.9 C)  Recent Labs  Lab 07/06/24 1210 07/06/24 1217  WBC  --  11.0*  CREATININE 3.10*  --   LATICACIDVEN 7.3*  --     Estimated Creatinine Clearance: 18.9 mL/min (A) (by C-G formula based on SCr of 3.1 mg/dL (H)).    Allergies[1]  Microbiology results: Pending  Thank you for allowing pharmacy to be a part of this patients care.  Dorn Buttner, PharmD, BCPS 07/06/2024 12:36 PM ED Clinical Pharmacist -  [phone]       [1]  Allergies Allergen Reactions   Cardizem  [Diltiazem ] Swelling    Swelling in lower legs

## 2024-07-06 NOTE — ED Notes (Signed)
 Bladder scan performed by NT. NT notified PA of results.

## 2024-07-06 NOTE — ED Provider Notes (Signed)
 " [facility] Provider Note   CSN: [id number] Arrival date & time: 07/06/24  1129     Patient presents with: No chief complaint on file.   Gabriel Everett is a 79 y.o. male with a history of atrial flutter, who presents to the emergency department for altered mental status and weakness. Per report from EMS: Patient lives alone and is fully ambulatory and independent at baseline.  Patient was found today with altered mental status and weakness.  Grandson, who checks on patient, found him on the floor 3 days ago and he has been progressively more weak.  Patient has had poor oral intake at home, and was treated for the flu 1 week ago-unknown if patient tested positive or was self treating at home.  Patient is noted to have diffuse skin tears and bruising in various areas.  EMS relates that while moving patient from the home to the ambulance he had a tonic-clonic seizure that lasted about 1 minute with postictal state afterwards where patient was very confused.  Patient presents confused to the emergency department. The patient is in no acute distress.    HPI     Prior to Admission medications  Medication Sig Start Date End Date Taking? Authorizing Provider  ondansetron  (ZOFRAN -ODT) 4 MG disintegrating tablet Take 8 mg by mouth 2 (two) times daily as needed for nausea or vomiting. 04/30/24  Yes [provider]  propranolol  (INDERAL ) 40 MG tablet Take 20 mg by mouth 2 (two) times daily. 12/04/23  Yes [provider]  cephALEXin (KEFLEX) 500 MG capsule Take 500 mg by mouth every 12 (twelve) hours as needed (for infections,allergies per patient). 10/24/23   [provider]  ibuprofen  (ADVIL ,MOTRIN ) 400 MG tablet Take 1 tablet (400 mg total) by mouth every 6 (six) hours as needed. Patient not taking: No sig reported 02/16/18   Delorise Morna LABOR, PA-C    Allergies: Cardizem  [diltiazem ]    Review of Systems  Neurological:   Positive for weakness.    Updated Vital Signs BP (!) 102/51   Pulse 67   Temp (!) 97.5 F (36.4 C) (Oral)   Resp 17   Ht 5' 10 (1.778 m)   Wt 70.3 kg   SpO2 100%   BMI 22.24 kg/m   Physical Exam Vitals and nursing note reviewed.  Constitutional:      General: He is awake. He is not in acute distress.    Appearance: He is underweight. He is ill-appearing.  HENT:     Head: Normocephalic and atraumatic.  Eyes:     Extraocular Movements: Extraocular movements intact.     Conjunctiva/sclera: Conjunctivae normal.     Pupils: Pupils are equal, round, and reactive to light.  Cardiovascular:     Rate and Rhythm: Normal rate and regular rhythm.     Pulses: Normal pulses.  Pulmonary:     Effort: Pulmonary effort is normal. No respiratory distress.     Breath sounds: Normal breath sounds. No decreased breath sounds, wheezing or rhonchi.  Abdominal:     General: Abdomen is flat.     Palpations: Abdomen is soft.     Tenderness: There is no abdominal tenderness.  Musculoskeletal:        General: Normal range of motion.     Cervical back: Normal range of motion.     Right lower leg: No edema.     Left lower leg: No edema.     Comments: Multiple, healing,  small skin tears noted to upper and lower extremities -no active bleeding or signs of infection.  There is some ecchymosis to patient's upper abdomen and posterior left scapula without crepitus of obvious signs of trauma.   Skin:    General: Skin is cool and dry.     Capillary Refill: Capillary refill takes less than 2 seconds.     Coloration: Skin is pale.     Findings: Ecchymosis and wound present.     Comments: Patient skin notably chilled to the touch.   Neurological:     General: No focal deficit present.     Mental Status: He is confused.     GCS: GCS eye subscore is 4. GCS verbal subscore is 4. GCS motor subscore is 4.     Comments: GCS 12 on arrival.   On reassessment, Patient alert and oriented.  Speech clear and  appropriate.  No aphasia or dysarthria.   Motor strength decreased in all extremities.  Sensation intact to light touch in upper and lower extremities bilaterally.   Psychiatric:        Mood and Affect: Mood normal.        Behavior: Behavior is cooperative.     (all labs ordered are listed, but only abnormal results are displayed) Labs Reviewed  COMPREHENSIVE METABOLIC PANEL WITH GFR - Abnormal; Notable for the following components:      Result Value   Chloride 94 (*)    CO2 10 (*)    BUN 37 (*)    Creatinine, Ser 2.74 (*)    Calcium 8.0 (*)    Total Protein 5.6 (*)    Albumin 3.4 (*)    AST 92 (*)    ALT 46 (*)    Total Bilirubin 3.3 (*)    GFR, Estimated 23 (*)    Anion gap 32 (*)    All other components within normal limits  CBC WITH DIFFERENTIAL/PLATELET - Abnormal; Notable for the following components:   WBC 11.0 (*)    RBC 2.94 (*)    Hemoglobin 11.4 (*)    HCT 34.7 (*)    MCV 118.0 (*)    MCH 38.8 (*)    Platelets 108 (*)    Neutro Abs 9.6 (*)    Abs Immature Granulocytes 0.08 (*)    All other components within normal limits  PROTIME-INR - Abnormal; Notable for the following components:   Prothrombin Time 18.9 (*)    INR 1.5 (*)    All other components within normal limits  I-STAT CG4 LACTIC ACID, ED - Abnormal; Notable for the following components:   Lactic Acid, Venous 7.3 (*)    All other components within normal limits  I-STAT CHEM 8, ED - Abnormal; Notable for the following components:   Sodium 134 (*)    BUN 40 (*)    Creatinine, Ser 3.10 (*)    Calcium, Ion 0.90 (*)    TCO2 13 (*)    Hemoglobin 12.2 (*)    HCT 36.0 (*)    All other components within normal limits  I-STAT CG4 LACTIC ACID, ED - Abnormal; Notable for the following components:   Lactic Acid, Venous 5.6 (*)    All other components within normal limits  RESP PANEL BY RT-PCR (RSV, FLU A&B, COVID)  RVPGX2  CULTURE, BLOOD (ROUTINE X 2)  CULTURE, BLOOD (ROUTINE X 2)  ETHANOL  LIPASE,  BLOOD  MAGNESIUM   URINALYSIS, W/ REFLEX TO CULTURE (INFECTION SUSPECTED)  APTT  TSH  COMPREHENSIVE  METABOLIC PANEL WITH GFR  CBC  I-STAT CG4 LACTIC ACID, ED    EKG: None  Radiology: CT CHEST ABDOMEN PELVIS WO CONTRAST Result Date: 07/06/2024 EXAM: CT CHEST, ABDOMEN AND PELVIS WITHOUT CONTRAST 07/06/2024 02:31:11 PM TECHNIQUE: CT of the chest, abdomen and pelvis was performed without the administration of intravenous contrast. Multiplanar reformatted images are provided for review. Automated exposure control, iterative reconstruction, and/or weight based adjustment of the mA/kV was utilized to reduce the radiation dose to as low as reasonably achievable. COMPARISON: CT chest 09/07/2022. CLINICAL HISTORY: Sepsis. FINDINGS: CHEST: MEDIASTINUM AND LYMPH NODES: Heart and pericardium are unremarkable. The central airways are clear. No mediastinal, hilar or axillary lymphadenopathy. LUNGS AND PLEURA: No focal consolidation or pulmonary edema. No pleural effusion. No pneumothorax. ABDOMEN AND PELVIS: LIVER: Attenuation in the liver consistent with hepatic steatosis. GALLBLADDER AND BILE DUCTS: Unremarkable. No biliary ductal dilatation. SPLEEN: No acute abnormality. PANCREAS: No acute abnormality. ADRENAL GLANDS: No acute abnormality. KIDNEYS, URETERS AND BLADDER: No stones in the kidneys or ureters. No hydronephrosis. No perinephric or periureteral stranding. Urinary bladder is unremarkable. GI AND BOWEL: Stomach demonstrates no acute abnormality. There is no bowel obstruction. REPRODUCTIVE ORGANS: No acute abnormality. PERITONEUM AND RETROPERITONEUM: No ascites. No free air. VASCULATURE: Aorta is normal in caliber. ABDOMINAL AND PELVIS LYMPH NODES: No lymphadenopathy. REPRODUCTIVE ORGANS: No acute abnormality. BONES AND SOFT TISSUES: No acute osseous abnormality. No focal soft tissue abnormality. OVERALL ASSESSMENT: No evidence of infection in the chest, abdomen, or pelvis. No acute findings. IMPRESSION: 1.  No evidence of infection in the chest, abdomen, or pelvis. 2. No acute findings. 3. Hepatic steatosis. Electronically signed by: Norleen Boxer MD 07/06/2024 03:30 PM EST RP Workstation: [id number]   CT Head Wo Contrast Result Date: 07/06/2024 EXAM: CT HEAD WITHOUT CONTRAST 07/06/2024 02:31:11 PM TECHNIQUE: CT of the head was performed without the administration of intravenous contrast. Automated exposure control, iterative reconstruction, and/or weight based adjustment of the mA/kV was utilized to reduce the radiation dose to as low as reasonably achievable. COMPARISON: 11/22/2023 CLINICAL HISTORY: Mental status change, unknown cause. FINDINGS: BRAIN AND VENTRICLES: No acute hemorrhage. No evidence of acute infarct. No hydrocephalus. No extra-axial collection. No mass effect or midline shift. Generalized cerebral volume loss and chronic small vessel ischemic disease changes are stable. Intracranial arterial calcification. ORBITS: No acute abnormality. SINUSES: Mild mucosal thickening in paranasal sinuses. Chronic rightward nasal septal deviation. SOFT TISSUES AND SKULL: No acute soft tissue abnormality. No skull fracture. IMPRESSION: 1. No acute intracranial abnormality. 2. Stable generalized cerebral volume loss and chronic small vessel ischemic disease changes. Electronically signed by: Lonni Necessary MD 07/06/2024 03:05 PM EST RP Workstation: [id number]   DG Chest Port 1 View Result Date: 07/06/2024 EXAM: 1 VIEW(S) XRAY OF THE CHEST 07/06/2024 12:09:00 PM COMPARISON: 02/16/2018 CLINICAL HISTORY: Questionable sepsis - evaluate for abnormality FINDINGS: LUNGS AND PLEURA: No focal pulmonary opacity. No pleural effusion. No pneumothorax. HEART AND MEDIASTINUM: Atherosclerotic calcifications. No acute abnormality of the cardiac and mediastinal silhouettes. BONES AND SOFT TISSUES: No acute osseous abnormality. IMPRESSION: 1. No acute findings. Electronically signed by: Waddell Calk MD 07/06/2024 12:58 PM EST  RP Workstation: [id number]     Procedures   Medications Ordered in the ED  metroNIDAZOLE  (FLAGYL ) IVPB 500 mg (0 mg Intravenous Stopped 07/06/24 1419)  lactated ringers  infusion (150 mL/hr Intravenous New Bag/Given 07/06/24 1319)  ceFEPIme  (MAXIPIME ) 2 g in sodium chloride  0.9 % 100 mL IVPB (has no administration in time range)  vancomycin  variable dose per unstable renal function (  pharmacist dosing) (has no administration in time range)  LORazepam  (ATIVAN ) injection 2 mg (has no administration in time range)  heparin  injection 5,000 Units (has no administration in time range)  sodium chloride  flush (NS) 0.9 % injection 3 mL (has no administration in time range)  acetaminophen  (TYLENOL ) tablet 650 mg (has no administration in time range)    Or  acetaminophen  (TYLENOL ) suppository 650 mg (has no administration in time range)  polyethylene glycol (MIRALAX  / GLYCOLAX ) packet 17 g (has no administration in time range)  LORazepam  (ATIVAN ) tablet 1-4 mg (has no administration in time range)    Or  LORazepam  (ATIVAN ) injection 1-4 mg (has no administration in time range)  thiamine  (VITAMIN B1) tablet 100 mg (has no administration in time range)    Or  thiamine  (VITAMIN B1) injection 100 mg (has no administration in time range)  folic acid  (FOLVITE ) tablet 1 mg (has no administration in time range)  multivitamin with minerals tablet 1 tablet (has no administration in time range)  LORazepam  (ATIVAN ) injection 1 mg (has no administration in time range)  lactated ringers  bolus 1,000 mL (0 mLs Intravenous Stopped 07/06/24 1239)  vancomycin  (VANCOREADY) IVPB 1500 mg/300 mL (1,500 mg Intravenous New Bag/Given 07/06/24 1442)                                    Medical Decision Making Amount and/or Complexity of Data Reviewed Labs: ordered. Radiology: ordered.  Risk Prescription drug management. Decision regarding hospitalization.   Patient presents to the ED for: Weakness, AMS This involves  an extensive number of treatment options Differential diagnosis includes: Neurologic etiology Metabolic etiology Other, infectious etiology Traumatic etiology-frequent falls Co-morbid conditions: atrial fibrillation  Additional history/records obtained and reviewed: Additional history obtained from patient's primary care who showed up for ED visit as a good historian to patients medical history   Data Reviewed / Actions Taken: Labs ordered/reviewed-initial lactic acid on arrival was 7.3, leukocytosis 11, increased creatinine from baseline at 2.74, and anemia  Imaging ordered/reviewed -without any acute findings, some stable, chronic vessel changes to CT head. I agree with the radiologists interpretation.   Management / Treatments: Patient was administered lactated ringer  fluid bolus, broad-spectrum antibiotics for sepsis presentation Reevaluation of the patient after these medicines showed that the patient blood pressure notably improved. I have reviewed the patients home medicines and have made adjustments as needed  ED Course / Reassessments: Problem List:  79 year old male presented for AMS/weakness. Initial assessment included history, physical exam, and review of prior medical records.  Patient presented mildly hypothermic and hypotensive, laboratory studies were obtained and showed leukocytosis with elevated lactic acid -given this patient was started on sepsis protocol and given broad-spectrum antibiotics.  After active warming and fluid administration patient became notably more alert and talkative  -further history was obtained that patient has been notably weak over the last several days with recurrent falls.  Given worsening infection with unknown etiology/sepsis hospitalist was contacted for patient admission.   Patient response: Improved with ED management of fluid and rewarming Serial reassessments performed: Yes    Consultations:  Hospitalist - Melvin MD Consult  recommendations incorporated into plan: Patient to be admitted for further evaluation and continued care of sepsis with unknown infectious etiology  Disposition: Disposition: Admission  This note was produced using Dragon Medical voice recognition. While I have reviewed and verified all clinical information, transcription errors may remain.  Final diagnoses:  None    ED Discharge Orders     None          Willma Duwaine CROME, [place] 07/06/24 1712  "

## 2024-07-06 NOTE — H&P (Addendum)
 " History and Physical   Gabriel Everett FMW:[id number] DOB: 06/26/1945 DOA: 07/06/2024  PCP: [name], FNP   Patient coming from: Home  Chief Complaint: AMS, weakness  HPI: Gabriel Everett is a 79 y.o. male with medical history significant of hypertension, atrial fibrillation, alcohol  use presenting with altered mental status and weakness.  Patient was alone and at baseline is able to be independent.  His grandson does come to check on him every now and then.  Found him on the floor yesterday.  Tried to care for him overnight but patient was getting up every hour and remained confused and had several more falls.  EMS was called this morning. Also reported he was diagnosed/treated for the flu about a week ago.  EMS reports that patient had skin tears in multiple places when they evaluated him.  Also noted initial blood pressure was in the 70s systolic and this improved with 300 cc IV fluid.  Patient reportedly had a seizure lasting 1 minute with tonic-clonic activity and confusion afterwards and route.  Mentation improving in the ED, but remains unable to participate in review of systems.  No specific complaints.  ED Course: Vital signs in the ED notable for hypothermia at 94.8, blood pressure in the 90s-110 systolic, heart rate in the 50s-70s.  Lab workup included CMP with chloride 94, bicarb 20, gap 30, BUN 37, creatinine elevated to 2.74 from baseline of 1, calcium 8.0, protein 5.6, albumin 3.4, AST 92, ALT 46, T. bili 3.3.  CBC with mild leukocytosis to 11, hemoglobin stable 11.4, platelets 108.  PT 18.9, INR 1.5, PTT pending.  Lactic acid initially elevated to 7.3, repeat 5.6, additional repeats pending.  Lipase normal.  Respiratory panel for flu COVID and RSV negative.  Urinalysis pending.  Ethanol level negative.  Blood cultures pending.  Magnesium  normal.  Chest x-ray showed no acute abnormality.  CT head showed no acute abnormality.  CT of the chest abdomen pelvis showed no acute  abnormality but did show some hepatic steatosis.  Patient received vancomycin , cefepime , Flagyl  in the ED.  Also received 1 L IV fluids and started on 150 cc an hour.  Bair hugger applied for rewarming.  Review of Systems: Unable to participate in review of systems.  No specific complaints.  Past Medical History:  Diagnosis Date   Allergy    Seasonal   Atrial fibrillation with RVR (HCC)    Atrial flutter with rapid ventricular response (HCC) 06/18/2013   Dizziness 06/18/2013   due to inner ear infection   Elevated blood pressure    elevated in the office 07/14/13 though he does not carry a diagnosis of HTN   History of colonic diverticulitis 06/19/2008   Right-sided.   PVC's (premature ventricular contractions)    By monitor 05/22/13    Past Surgical History:  Procedure Laterality Date   none      Social History  reports that he has never smoked. He has never used smokeless tobacco. He reports current alcohol  use of about 21.0 - 28.0 standard drinks of alcohol  per week. He reports that he does not use drugs.  Allergies[1]  Family History  Problem Relation Age of Onset   Cancer Mother 79       esophageal  Reviewed on admission  Prior to Admission medications  Medication Sig Start Date End Date Taking? Authorizing Provider  cephALEXin (KEFLEX) 500 MG capsule Take 500 mg by mouth every 12 (twelve) hours as needed (for infections,allergies per patient). 10/24/23  [provider]  ibuprofen  (ADVIL ,MOTRIN ) 400 MG tablet Take 1 tablet (400 mg total) by mouth every 6 (six) hours as needed. Patient not taking: Reported on 12/13/2023 02/16/18   Layden, Lindsey A, PA-C  propranolol  (INDERAL ) 20 MG tablet Take 20 mg by mouth 2 (two) times daily. 12/04/23   [provider]    Physical Exam: Vitals:   07/06/24 1245 07/06/24 1300 07/06/24 1445 07/06/24 1605  BP: (!) 94/54 (!) 102/51    Pulse: (!) 59 60  67  Resp: (!) 21 16  17   Temp:   (!) 97.5 F (36.4 C)    TempSrc:   Oral   SpO2: 100% 100%  100%  Weight:      Height:        Physical Exam Constitutional:      General: He is not in acute distress.    Appearance: Normal appearance. He is ill-appearing.  HENT:     Head: Normocephalic and atraumatic.     Mouth/Throat:     Mouth: Mucous membranes are moist.     Pharynx: Oropharynx is clear.  Eyes:     Extraocular Movements: Extraocular movements intact.     Pupils: Pupils are equal, round, and reactive to light.  Cardiovascular:     Rate and Rhythm: Normal rate and regular rhythm.     Pulses: Normal pulses.     Heart sounds: Normal heart sounds.  Pulmonary:     Effort: Pulmonary effort is normal. No respiratory distress.     Breath sounds: Normal breath sounds.  Abdominal:     General: Bowel sounds are normal. There is no distension.     Palpations: Abdomen is soft.     Tenderness: There is no abdominal tenderness.  Musculoskeletal:        General: No swelling or deformity.  Skin:    General: Skin is warm and dry.  Neurological:     General: No focal deficit present.     Mental Status: He is disoriented.    Labs on Admission: I have personally reviewed following labs and imaging studies  CBC: Recent Labs  Lab 07/06/24 1210 07/06/24 1217  WBC  --  11.0*  NEUTROABS  --  9.6*  HGB 12.2* 11.4*  HCT 36.0* 34.7*  MCV  --  118.0*  PLT  --  108*    Basic Metabolic Panel: Recent Labs  Lab 07/06/24 1210 07/06/24 1217  NA 134* 135  K 4.3 4.5  CL 102 94*  CO2  --  10*  GLUCOSE 82 84  BUN 40* 37*  CREATININE 3.10* 2.74*  CALCIUM  --  8.0*  MG  --  1.7    GFR: Estimated Creatinine Clearance: 21.4 mL/min (A) (by C-G formula based on SCr of 2.74 mg/dL (H)).  Liver Function Tests: Recent Labs  Lab 07/06/24 1217  AST 92*  ALT 46*  ALKPHOS 63  BILITOT 3.3*  PROT 5.6*  ALBUMIN 3.4*    Urine analysis:    Component Value Date/Time   COLORURINE YELLOW 11/22/2023 2149   APPEARANCEUR CLEAR 11/22/2023 2149    LABSPEC 1.009 11/22/2023 2149   PHURINE 7.0 11/22/2023 2149   GLUCOSEU NEGATIVE 11/22/2023 2149   HGBUR NEGATIVE 11/22/2023 2149   BILIRUBINUR NEGATIVE 11/22/2023 2149   KETONESUR 15 (A) 11/22/2023 2149   PROTEINUR NEGATIVE 11/22/2023 2149   UROBILINOGEN 0.2 04/27/2009 1425   NITRITE NEGATIVE 11/22/2023 2149   LEUKOCYTESUR NEGATIVE 11/22/2023 2149    Radiological Exams on Admission: CT CHEST ABDOMEN  PELVIS WO CONTRAST Result Date: 07/06/2024 EXAM: CT CHEST, ABDOMEN AND PELVIS WITHOUT CONTRAST 07/06/2024 02:31:11 PM TECHNIQUE: CT of the chest, abdomen and pelvis was performed without the administration of intravenous contrast. Multiplanar reformatted images are provided for review. Automated exposure control, iterative reconstruction, and/or weight based adjustment of the mA/kV was utilized to reduce the radiation dose to as low as reasonably achievable. COMPARISON: CT chest 09/07/2022. CLINICAL HISTORY: Sepsis. FINDINGS: CHEST: MEDIASTINUM AND LYMPH NODES: Heart and pericardium are unremarkable. The central airways are clear. No mediastinal, hilar or axillary lymphadenopathy. LUNGS AND PLEURA: No focal consolidation or pulmonary edema. No pleural effusion. No pneumothorax. ABDOMEN AND PELVIS: LIVER: Attenuation in the liver consistent with hepatic steatosis. GALLBLADDER AND BILE DUCTS: Unremarkable. No biliary ductal dilatation. SPLEEN: No acute abnormality. PANCREAS: No acute abnormality. ADRENAL GLANDS: No acute abnormality. KIDNEYS, URETERS AND BLADDER: No stones in the kidneys or ureters. No hydronephrosis. No perinephric or periureteral stranding. Urinary bladder is unremarkable. GI AND BOWEL: Stomach demonstrates no acute abnormality. There is no bowel obstruction. REPRODUCTIVE ORGANS: No acute abnormality. PERITONEUM AND RETROPERITONEUM: No ascites. No free air. VASCULATURE: Aorta is normal in caliber. ABDOMINAL AND PELVIS LYMPH NODES: No lymphadenopathy. REPRODUCTIVE ORGANS: No acute  abnormality. BONES AND SOFT TISSUES: No acute osseous abnormality. No focal soft tissue abnormality. OVERALL ASSESSMENT: No evidence of infection in the chest, abdomen, or pelvis. No acute findings. IMPRESSION: 1. No evidence of infection in the chest, abdomen, or pelvis. 2. No acute findings. 3. Hepatic steatosis. Electronically signed by: Norleen Boxer MD 07/06/2024 03:30 PM EST RP Workstation: [id number]   CT Head Wo Contrast Result Date: 07/06/2024 EXAM: CT HEAD WITHOUT CONTRAST 07/06/2024 02:31:11 PM TECHNIQUE: CT of the head was performed without the administration of intravenous contrast. Automated exposure control, iterative reconstruction, and/or weight based adjustment of the mA/kV was utilized to reduce the radiation dose to as low as reasonably achievable. COMPARISON: 11/22/2023 CLINICAL HISTORY: Mental status change, unknown cause. FINDINGS: BRAIN AND VENTRICLES: No acute hemorrhage. No evidence of acute infarct. No hydrocephalus. No extra-axial collection. No mass effect or midline shift. Generalized cerebral volume loss and chronic small vessel ischemic disease changes are stable. Intracranial arterial calcification. ORBITS: No acute abnormality. SINUSES: Mild mucosal thickening in paranasal sinuses. Chronic rightward nasal septal deviation. SOFT TISSUES AND SKULL: No acute soft tissue abnormality. No skull fracture. IMPRESSION: 1. No acute intracranial abnormality. 2. Stable generalized cerebral volume loss and chronic small vessel ischemic disease changes. Electronically signed by: Lonni Necessary MD 07/06/2024 03:05 PM EST RP Workstation: [id number]   DG Chest Port 1 View Result Date: 07/06/2024 EXAM: 1 VIEW(S) XRAY OF THE CHEST 07/06/2024 12:09:00 PM COMPARISON: 02/16/2018 CLINICAL HISTORY: Questionable sepsis - evaluate for abnormality FINDINGS: LUNGS AND PLEURA: No focal pulmonary opacity. No pleural effusion. No pneumothorax. HEART AND MEDIASTINUM: Atherosclerotic calcifications. No  acute abnormality of the cardiac and mediastinal silhouettes. BONES AND SOFT TISSUES: No acute osseous abnormality. IMPRESSION: 1. No acute findings. Electronically signed by: Waddell Calk MD 07/06/2024 12:58 PM EST RP Workstation: [id number]   EKG: Ordered in the ED, but not yet performed/available for review.  Assessment/Plan Principal Problem:   Acute encephalopathy Active Problems:   SIRS (systemic inflammatory response syndrome) (HCC)   Essential hypertension   Alcohol  use   PAF (paroxysmal atrial fibrillation) (HCC)   Seizure-like activity (HCC)   AKI (acute kidney injury)   High anion gap metabolic acidosis   79 year old male with history of alcohol  use, hypertension, A-fib and recent diagnosis of flu about a week prior.  Presenting after falls at home and worsening weakness/confusion after patient was found yesterday by grandson.  Worsened overnight and EMS called today.  Possible seizure activity and route.  Found to have hypotension, AKI, elevated lactic acid, mild leukocytosis.  No source for infection, concern for SIRS/sepsis versus seizure.  History of alcohol  use and concern for prior withdrawal during previous admission.  Acute encephalopathy SIRS Hypothermia Seizure-like activity > Patient presenting with worsening weakness and altered mental status at home.  Has had decreased p.o. intake after a fall several days ago.  Patient also reportedly had the flu a week ago,, negative for flu in the ED. > EMS called due to progressive decline at home today.  Patient found to have multiple skin tears and found to be initially hypotensive and 70 systolic.  Also reportedly had seizure-like activity with tonic-clonic movements lasting about a minute when patient was placed in a stair chair.  Resolved without intervention.  Had increased confusion,/possible postictal state afterwards. > Patient found to have mild leukocytosis to 11 with initial lactic acid elevated to 7.3, repeat down to  5.6 after 1 L IV fluids. LAS 2/2 Sepsis vs Dehydration vs Seizure. > Presentation consistent with SIRS with hypothermia to 94.8, initial hypotension in the 70s, leukocytosis to 11.  However no source identified with no acute abnormality on chest x-ray, CT head, CT chest abdomen and pelvis.  Urinalysis and blood cultures are still pending. > Seizure-like activity could have been movements related to syncope given  patient has no prior history of seizure and had low blood pressure in the setting of decreased p.o. intake.  However if workup remains negative for infection seizures could also elevate lactic acid and cause mild white count elevation.  Will also need to find out more about patient's alcohol  use history.  History of alcohol  use and concern for prior withdrawal in chart and ethanol negative with decreased PO recently. > Will continue to cover for SIRS/sepsis while workup is ongoing and also workup seizure-like activity with EEG and MRI brain.  Will hold off on any antiepileptics/neurology consult unless MRI brain is positive or recurrent seizure activity occur. - Monitor in progressive unit - Continue active rewarming, spare limbs when able to avoid peripheral vasodilation - Swallow screen before starting diet - Safety sitter, son reports needing frequent redirection at this time and during previous admission. For SIRS, ?Sepsis - Continue with vancomycin , cefepime , Flagyl  - Follow-up urinalysis, blood cultures - Continue with IV fluids - Continue to trend lactic acid - Trend fever curve and WBC For seizure-like activity - EEG - MRI brain - Seizure precautions - As needed Ativan  if seizure activity recurs  AKI Elevated anion gap metabolic acidosis > Creatinine elevated to 2.74 from baseline 1.  In the setting of recent decreased p.o. intake.  Bicarb 10 and gap greater than 30 in the setting of initial lactic acid of 7.3.  Factors who improve his lactic acid improved. - Continue IV  fluids as above - Trend renal function and electrolytes  Hypertension - Working to confirm home medications, but will hold antihypertensives either way in the setting of hypotension and possible sepsis.  Atrial fibrillation > Awaiting EEG.  Telemetry shows sinus rhythm, with episodes of either breakthrough atrial flutter versus his ongoing tremors.. - Holding nodal blocking agents that could affect blood pressure for now - Does not appear to be currently on anticoagulation  Alcohol  use ?Withdrawal > Does continue to drink daily. 2 mixed drinks per day per grandson (as  far as he knows). Each drink probably has about 2 shots of vodka in it. > Concern during previous admission for withdrawal and possible withdrawal related delirium.  Did require multiple doses of Ativan  that admission. > Tried to have 1 drink yesterday per grandson but had nausea and vomiting with this. > Ethanol level negative in the ED today.  Is having tremors. - CIWA with Ativan  - Thiamine , folate, multivitamin - Safety sitter  DVT prophylaxis: Heparin  Code Status:   Full Family Communication:  Update at bedside  Disposition Plan:   Patient is from:  Home  Anticipated DC to:  Pending clinical course  Anticipated DC date:  2 to 5 days  Anticipated DC barriers: None  Consults called:  None Admission status:  Inpatient, progressive  Severity of Illness: The appropriate patient status for this patient is INPATIENT. Inpatient status is judged to be reasonable and necessary in order to provide the required intensity of service to ensure the patient's safety. The patient's presenting symptoms, physical exam findings, and initial radiographic and laboratory data in the context of their chronic comorbidities is felt to place them at high risk for further clinical deterioration. Furthermore, it is not anticipated that the patient will be medically stable for discharge from the hospital within 2 midnights of admission.   * I  certify that at the point of admission it is my clinical judgment that the patient will require inpatient hospital care spanning beyond 2 midnights from the point of admission due to high intensity of service, high risk for further deterioration and high frequency of surveillance required.Gabriel Marsa KATHEE Seena MD [facility]  How to contact the [facility] Attending or Consulting provider 7A - 7P or covering provider during after hours 7P -7A, for this patient?   Check the care team in [facility] and look for a) attending/consulting [facility] provider listed and b) the [facility] team listed Log into [email or web address] and use [facility] universal password to access. If you do not have the password, please contact the hospital operator. Locate the [facility] provider you are looking for under [facility] and page to a number that you can be directly reached. If you still have difficulty reaching the provider, please page the [facility] (Director on Call) for the Hospitalists listed on amion for assistance.  07/06/2024, 4:14 PM       [1]  Allergies Allergen Reactions   Cardizem  [Diltiazem ] Swelling    Swelling in lower legs    "

## 2024-07-06 NOTE — ED Triage Notes (Signed)
 Patient arrives via [facility] from home for altered mental status, weakness, and seizure. Patient was getting treated for flu x1 week approximately. 3 days ago grandson found patient on floor. Diffuse skin tears, no other trauma. Alert oriented x4 at this time refused ems transport. 3 days confusion, weakness, poor PO intake. Today patient is disoriented and unable to sit or stand due to weakness. Initial BP 70/40, HR 60 NSR. IV established 20 LFA -- 300 cc fluid given 100 systolic BP currently alert oriented x3, unable to recall events. Patient placed on stair chair with ems and had tonic clonic seizure approximately 1 min. No hx of same. No meds given, came out of seizure prior to med admin. 20 LAC started due to infiltrated 20 LFA. 150cc given -- BP 104/70. HR 60 throughout. RR 16, 97 on room air, CBG 98.

## 2024-07-06 NOTE — ED Notes (Addendum)
 MD and PA notified of temp. Warm blankets applied.

## 2024-07-06 NOTE — ED Notes (Addendum)
 BP,Map,RESP have all been abnormal due to PT moving and anxiousness , will continue to monitor.SABRASABRA

## 2024-07-06 NOTE — Progress Notes (Signed)
 Elink following for sepsis protocol.

## 2024-07-06 NOTE — ED Notes (Signed)
 Temp foley and in and out attempted by RN. PA notified.

## 2024-07-07 ENCOUNTER — Inpatient Hospital Stay (HOSPITAL_COMMUNITY)

## 2024-07-07 DIAGNOSIS — R4182 Altered mental status, unspecified: Secondary | ICD-10-CM | POA: Diagnosis not present

## 2024-07-07 DIAGNOSIS — R569 Unspecified convulsions: Secondary | ICD-10-CM | POA: Diagnosis not present

## 2024-07-07 LAB — URINALYSIS, W/ REFLEX TO CULTURE (INFECTION SUSPECTED)
Bilirubin Urine: NEGATIVE
Glucose, UA: 50 mg/dL — AB
Ketones, ur: 20 mg/dL — AB
Leukocytes,Ua: NEGATIVE
Nitrite: NEGATIVE
Protein, ur: 30 mg/dL — AB
Specific Gravity, Urine: 1.02 (ref 1.005–1.030)
pH: 5 (ref 5.0–8.0)

## 2024-07-07 LAB — COMPREHENSIVE METABOLIC PANEL WITH GFR
ALT: 57 U/L — ABNORMAL HIGH (ref 0–44)
AST: 121 U/L — ABNORMAL HIGH (ref 15–41)
Albumin: 3.1 g/dL — ABNORMAL LOW (ref 3.5–5.0)
Alkaline Phosphatase: 51 U/L (ref 38–126)
Anion gap: 25 — ABNORMAL HIGH (ref 5–15)
BUN: 55 mg/dL — ABNORMAL HIGH (ref 8–23)
CO2: 15 mmol/L — ABNORMAL LOW (ref 22–32)
Calcium: 7.5 mg/dL — ABNORMAL LOW (ref 8.9–10.3)
Chloride: 96 mmol/L — ABNORMAL LOW (ref 98–111)
Creatinine, Ser: 3.08 mg/dL — ABNORMAL HIGH (ref 0.61–1.24)
GFR, Estimated: 20 mL/min — ABNORMAL LOW
Glucose, Bld: 111 mg/dL — ABNORMAL HIGH (ref 70–99)
Potassium: 4.3 mmol/L (ref 3.5–5.1)
Sodium: 137 mmol/L (ref 135–145)
Total Bilirubin: 1.8 mg/dL — ABNORMAL HIGH (ref 0.0–1.2)
Total Protein: 5.1 g/dL — ABNORMAL LOW (ref 6.5–8.1)

## 2024-07-07 LAB — CBC
HCT: 29.7 % — ABNORMAL LOW (ref 39.0–52.0)
Hemoglobin: 10.6 g/dL — ABNORMAL LOW (ref 13.0–17.0)
MCH: 39.7 pg — ABNORMAL HIGH (ref 26.0–34.0)
MCHC: 35.7 g/dL (ref 30.0–36.0)
MCV: 111.2 fL — ABNORMAL HIGH (ref 80.0–100.0)
Platelets: 101 K/uL — ABNORMAL LOW (ref 150–400)
RBC: 2.67 MIL/uL — ABNORMAL LOW (ref 4.22–5.81)
RDW: 13.7 % (ref 11.5–15.5)
WBC: 9.2 K/uL (ref 4.0–10.5)
nRBC: 0 % (ref 0.0–0.2)

## 2024-07-07 LAB — TSH: TSH: 5.34 u[IU]/mL — ABNORMAL HIGH (ref 0.350–4.500)

## 2024-07-07 LAB — AMMONIA: Ammonia: 37 umol/L — ABNORMAL HIGH (ref 9–35)

## 2024-07-07 LAB — VITAMIN B12: Vitamin B-12: 295 pg/mL (ref 180–914)

## 2024-07-07 LAB — MRSA NEXT GEN BY PCR, NASAL: MRSA by PCR Next Gen: NOT DETECTED

## 2024-07-07 LAB — I-STAT CG4 LACTIC ACID, ED: Lactic Acid, Venous: 2 mmol/L (ref 0.5–1.9)

## 2024-07-07 MED ORDER — SODIUM BICARBONATE 8.4 % IV SOLN
INTRAVENOUS | Status: DC
  Filled 2024-07-07 (×2): qty 1000

## 2024-07-07 MED ORDER — THIAMINE HCL 100 MG/ML IJ SOLN
500.0000 mg | Freq: Every day | INTRAVENOUS | Status: AC
  Administered 2024-07-07 – 2024-07-09 (×3): 500 mg via INTRAVENOUS
  Filled 2024-07-07: qty 500
  Filled 2024-07-07 (×2): qty 5

## 2024-07-07 MED ORDER — SODIUM CHLORIDE 0.9 % IV SOLN
2.0000 g | INTRAVENOUS | Status: DC
  Administered 2024-07-07: 2 g via INTRAVENOUS
  Filled 2024-07-07 (×2): qty 20

## 2024-07-07 MED ORDER — PANTOPRAZOLE SODIUM 40 MG IV SOLR
40.0000 mg | Freq: Two times a day (BID) | INTRAVENOUS | Status: DC
  Administered 2024-07-07 – 2024-07-15 (×17): 40 mg via INTRAVENOUS
  Filled 2024-07-07 (×17): qty 10

## 2024-07-07 MED ORDER — LACTATED RINGERS IV SOLN: INTRAVENOUS | Status: DC

## 2024-07-07 MED ORDER — IPRATROPIUM-ALBUTEROL 0.5-2.5 (3) MG/3ML IN SOLN: 3.0000 mL | Freq: Four times a day (QID) | RESPIRATORY_TRACT | Status: DC | PRN

## 2024-07-07 MED ORDER — LINEZOLID 600 MG/300ML IV SOLN
600.0000 mg | Freq: Two times a day (BID) | INTRAVENOUS | Status: DC
  Administered 2024-07-07 (×2): 600 mg via INTRAVENOUS
  Filled 2024-07-07 (×3): qty 300

## 2024-07-07 MED ORDER — IPRATROPIUM-ALBUTEROL 0.5-2.5 (3) MG/3ML IN SOLN
3.0000 mL | Freq: Three times a day (TID) | RESPIRATORY_TRACT | Status: DC
  Administered 2024-07-07: 3 mL via RESPIRATORY_TRACT
  Filled 2024-07-07: qty 3

## 2024-07-07 NOTE — Discharge Instructions (Addendum)
 To address social isolation:  Education Officer, [employer]: [phone] / [address] [place], [place] [address]  -Dial 988: Talk lifeline 24/7.  -[place]  - [facility] Warmline: [phone]  -Enroll in PACE program (Program of All-Inclusive Care for the Elderly): a Medicare and Medicaid initiative that provides comprehensive medical and social services to frail seniors (55+) who need nursing home-level care but prefer to stay in their communities, allowing them to age in place with support like primary care, therapy, meals, and transportation, coordinated by an interdisciplinary team. If you have both Medicare (for people 65+) and Medicaid (income-tested), then you may pay nothing. People without Medicare or Medicaid pay a monthly premium. The amount of this premium depends on your healthcare and financial needs. Long-term care insurance may pay for your PACE care. This coverage is determined by your insurer. Enrollment Information: ([phone]) [phone] Office: [phone]  In a time of Crisis: Integrated family Services/Therapeutic Alternatives.  Mobile Crisis Management provides immediate crisis response, 24/7.  Call [phone] Depending on the county. [facility] for MH/DD/[facility] is available 24 hours a day, 7 days a week. Customer Service Specialists will assist you to find a crisis provider that is well-matched with your needs. Your local number is: [phone]  [facility] ([facility]) IOP, individual counseling, medication management [address] [place], [place] [address] [phone] Call for intake hours; Medicaid and Uninsured    Outpatient Providers  [facility] ([facility]) Group and individual counseling. [address] [place], [address] [phone] [place]: [phone]  [place]: [phone]  [facility] Offers IOP groups  multiple times per week. [address] [place], [address] [phone] Takes Medicaid and other insurances.   [facility]  Chemical Dependency Intensive Outpatient Program (IOP) [address] [place], [place], [address] [phone]  [facility]  IOP and Partial Hospitalization Program  [address] [place], [address] [phone] Private Insurance, [place] only for partial hospitalization  [facility] [place] [address], [address], [place], [address] [phone]  [facility] [place] [phone] [address] [facility] ([facility]) IOP, individual counseling, medication management [address] [place], [address] [phone] Medicaid and [facility]  [facility] [address] [place], [address] [phone] Private Insurance and Self Pay   [facility] [address], [place], [address] [phone]   [facility]  [address] [place], [address]  [facility]  [address], [place], [address] [phone]  [facility]  [address] [place], [address] [phone]  [facility]  Locations in [place], [place], [place], [place].  [phone] Accepts all insurances and some uninsured.  [facility] Treatment [phone] Ages 48-64. Accepts most insurances and some uninsured.    Residential Treatment Programs  [facility] [address] [place], [place] [address] [phone] or [phone] Detox and Residential Rehab 21 days (Medicaid, private insurance, and self pay. If Medicare, will look into funding). No methadone. Call for pre-screen.   [facility]) [address]  [place], [place]  [address] [phone] Detox 3-7 days (self Pay and Medicaid Limited). Transitional Program for females needs 60 days clean first.  Rehab Only for Males 60 days (Medicare, and Self Pay)-No methadone.  [facility]  [address] [place], [place] [address] [phone] or [phone] Private Insurance only  [facility] [place], [place] [address] Phone:  [phone] Must be detoxed 72 hours prior to admission; 28 day program.  Self-pay.  [facility] [address]  [place], [place] [phone] [employer], Medicare, [place] (not straight [place]). They offer assistance with transportation.   [facility] [address] [place],  [place], [place] [address] [phone] Christian Based Program. Men only. No insurance  [facility] [address] [place], [place] [address] Men's: [phone] No Medicaid. No Methadone.   [facility]  [address]  [place], [place] [address] [phone] Treatment Only, must make assessment appointment, and must be sober for assessment appointment. Self pay, [facility], must be [facility] resident. No methadone.   [facility]  [address] [place], [place] [address] [phone] No pending legal charges, Long-term work program. No methadone. Call for assessment.  [facility]  [address], [place], [place] [address] [phone] or [phone] Commercial Insurance Only  [facility] - [phone] Private Insurance (no [place]). Males/Females, call to make referrals, multiple facilities.   [facility] [address]  [place], [place] [address]  [phone] Men Only Upfront Fee Must be able to climb stairs. No benzos or narcotics.  [facility]-no methadone: [facility] [address] [place], [place] [address] [phone]  [phone])  [facility] Locations across the [place] (mainly [place] ) willing to help with transportation.  [phone] [employer].     AA Meetings Meeting Locator:  [email or web address] Also can download an app on that website.

## 2024-07-07 NOTE — Progress Notes (Signed)
 " PROGRESS NOTE    Gabriel Everett  FMW:[id number] DOB: [birth year] DOA: 07/06/2024 PCP: [name], FNP   Brief Narrative: 79 year old with past medical history significant for hypertension, A-fib, alcohol  use presented with altered mental status and weakness.  Patient was found down on the floor by family.  EMS was contacted.  Patient was noted to be confused.  Patient was recently treated for the flu a week ago.  Patient was noted to have multiple skin tears.  On EMS evaluation initial blood pressure was in the 70s.  Improved with IV fluids.  Patient and route had seizure lasting 1 minute with tonic-clonic activity.  Evaluation in the ED presented with a creatinine of 2.7 baseline of 1, bilirubin 3.3, white blood cell 11, hemoglobin 11, platelets 108, INR 1.5, lactic acid 7 subsequently repeated 5.  Respiratory panel, flu COVID RSV negative.  Chest x-ray showed no acute abnormality.  CT head showed no acute abnormality.  CT of chest abdomen and pelvis showed no acute abnormality but did show some hepatic steatosis   Assessment & Plan:   Principal Problem:   Acute encephalopathy Active Problems:   SIRS (systemic inflammatory response syndrome) (HCC)   Essential hypertension   Alcohol  use   PAF (paroxysmal atrial fibrillation) (HCC)   Seizure-like activity (HCC)   AKI (acute kidney injury)   High anion gap metabolic acidosis   1-Acute metabolic encephalopathy Seizure-like activity Seizure-like activity could have been movement related to syncope given patient with no prior history of seizure, he had low blood pressure. - He also has a previous history of alcohol  abuse concern for alcohol  withdrawal. - Follow MRI brain: No acute abnormality - Follow EEG Could be multifactorial in the setting of AKI hypotension, dehydration, infectious process.  Alcohol  withdrawal -Check ammonia level  SIRS Hypothermia Patient presents after several days of decreased oral intake.  EMS was  contacted due to progressive decline at home.  Patient found to have multiple skin tears on initially hypotensive.  Reportedly had seizure-like activity with tonic-clonic movement lasting about a minutes.  Had increased confusion and possible postictal state afterward - Presented with leukocytosis, lactic acidosis concern for sepsis - Follow urine and blood cultures - Change vancomycin  to linezolid . -Change cefepime  to ceftriaxone , to avoid worsening encephalopathy.  Continue IV Flagyl . -He could have had had urinary tract infection versus aspiration  Coffee Ground emesis;  Hold Heparin  Start IV protonix .  Monitor hemoglobin  AKI Metabolic acidosis elevated anion gap -Presented with a creatinine of 2.7--3.10  baseline 1 Suspect in the setting of poor oral intake Check CK level Continue IV fluids changed to bicarb drip  Hypertension - Hold propranolol  due to soft blood pressure  A-fib - Hold beta-blocker to avoid future hypotension -Was not on anticoagulation prior to admission  Alcohol  use ? Alcohol   withdrawal -Drinks 2 alcoholic mixed with , 2 shots of vodka daily - High dose  thiamine  for 3 days  Thrombocytopenia could be related to infectious process versus alcohol .  Monitor  Estimated body mass index is 22.24 kg/m as calculated from the following:   Height as of this encounter: 5' 10 (1.778 m).   Weight as of this encounter: 70.3 kg.   DVT prophylaxis: SCD, family report coffee-ground emesis Code Status: Full code discussed with grandson Family Communication: Grandson updated at bedside Disposition Plan:  Status is: Inpatient Remains inpatient appropriate because: Management of acute encephalopathy AKI    Consultants:  None  Procedures:  none  Antimicrobials:  Subjective: Per nurse he has been lethargic all morning, received several doses of Ativan  overnight for agitation.  On my evaluation patient was able to open eyes to voice appeared sleepy,  mumbling words unable to understand speech  Objective: Vitals:   07/07/24 0450 07/07/24 0615 07/07/24 0655 07/07/24 0745  BP: 110/65 109/62 111/62 (!) 105/48  Pulse: 71 72 77 (!) 115  Resp:  (!) 29  (!) 25  Temp:  99.2 F (37.3 C)    TempSrc:  Axillary    SpO2:  100%  100%  Weight:      Height:        Intake/Output Summary (Last 24 hours) at 07/07/2024 0852 Last data filed at 07/06/2024 1642 Gross per 24 hour  Intake 296.55 ml  Output --  Net 296.55 ml   Filed Weights   07/06/24 1136  Weight: 70.3 kg    Examination:  General exam: Appears calm and comfortable  Respiratory system: Bilateral rhonchorous respiratory effort normal. Cardiovascular system: S1 & S2 heard, RRR Gastrointestinal system: Abdomen is nondistended, soft and nontender. No organomegaly or masses felt. Normal bowel sounds heard. Central nervous system: Lethargic. Extremities: Symmetric 5 x 5 power. Skin: Multiple skin tears and bruises   Data Reviewed: I have personally reviewed following labs and imaging studies  CBC: Recent Labs  Lab 07/06/24 1210 07/06/24 1217 07/07/24 0349  WBC  --  11.0* 9.2  NEUTROABS  --  9.6*  --   HGB 12.2* 11.4* 10.6*  HCT 36.0* 34.7* 29.7*  MCV  --  118.0* 111.2*  PLT  --  108* 101*   Basic Metabolic Panel: Recent Labs  Lab 07/06/24 1210 07/06/24 1217 07/07/24 0349  NA 134* 135 137  K 4.3 4.5 4.3  CL 102 94* 96*  CO2  --  10* 15*  GLUCOSE 82 84 111*  BUN 40* 37* 55*  CREATININE 3.10* 2.74* 3.08*  CALCIUM  --  8.0* 7.5*  MG  --  1.7  --    GFR: Estimated Creatinine Clearance: 19 mL/min (A) (by C-G formula based on SCr of 3.08 mg/dL (H)). Liver Function Tests: Recent Labs  Lab 07/06/24 1217 07/07/24 0349  AST 92* 121*  ALT 46* 57*  ALKPHOS 63 51  BILITOT 3.3* 1.8*  PROT 5.6* 5.1*  ALBUMIN 3.4* 3.1*   Recent Labs  Lab 07/06/24 1217  LIPASE 48   No results for input(s): AMMONIA in the last 168 hours. Coagulation Profile: Recent Labs   Lab 07/06/24 1217  INR 1.5*   Cardiac Enzymes: No results for input(s): CKTOTAL, CKMB, CKMBINDEX, TROPONINI in the last 168 hours. BNP (last 3 results) No results for input(s): PROBNP in the last 8760 hours. HbA1C: No results for input(s): HGBA1C in the last 72 hours. CBG: No results for input(s): GLUCAP in the last 168 hours. Lipid Profile: No results for input(s): CHOL, HDL, LDLCALC, TRIG, CHOLHDL, LDLDIRECT in the last 72 hours. Thyroid Function Tests: No results for input(s): TSH, T4TOTAL, FREET4, T3FREE, THYROIDAB in the last 72 hours. Anemia Panel: No results for input(s): VITAMINB12, FOLATE, FERRITIN, TIBC, IRON, RETICCTPCT in the last 72 hours. Sepsis Labs: Recent Labs  Lab 07/06/24 1210 07/06/24 1408 07/06/24 2236 07/07/24 0404  LATICACIDVEN 7.3* 5.6* 3.1* 2.0*    Recent Results (from the past 240 hours)  Resp panel by RT-PCR (RSV, Flu A&B, Covid) Anterior Nasal Swab     Status: None   Collection Time: 07/06/24 11:46 AM   Specimen: Anterior Nasal Swab  Result Value Ref  Range Status   SARS Coronavirus 2 by RT PCR NEGATIVE NEGATIVE Final   Influenza A by PCR NEGATIVE NEGATIVE Final   Influenza B by PCR NEGATIVE NEGATIVE Final    Comment: (NOTE) The Xpert Xpress SARS-CoV-2/FLU/RSV plus assay is intended as an aid in the diagnosis of influenza from Nasopharyngeal swab specimens and should not be used as a sole basis for treatment. Nasal washings and aspirates are unacceptable for Xpert Xpress SARS-CoV-2/FLU/RSV testing.  Fact Sheet for Patients: bloggercourse.com  Fact Sheet for Healthcare Providers: seriousbroker.it  This test is not yet approved or cleared by the [place]  FDA and has been authorized for detection and/or diagnosis of SARS-CoV-2 by FDA under an Emergency Use Authorization (EUA). This EUA will remain in effect (meaning this test can be used)  for the duration of the COVID-19 declaration under Section 564(b)(1) of the Act, 21 U.S.C. section 360bbb-3(b)(1), unless the authorization is terminated or revoked.     Resp Syncytial Virus by PCR NEGATIVE NEGATIVE Final    Comment: (NOTE) Fact Sheet for Patients: bloggercourse.com  Fact Sheet for Healthcare Providers: seriousbroker.it  This test is not yet approved or cleared by the [place]  FDA and has been authorized for detection and/or diagnosis of SARS-CoV-2 by FDA under an Emergency Use Authorization (EUA). This EUA will remain in effect (meaning this test can be used) for the duration of the COVID-19 declaration under Section 564(b)(1) of the Act, 21 U.S.C. section 360bbb-3(b)(1), unless the authorization is terminated or revoked.  Performed at [facility], [address] [place], [place] [address]   Blood Culture (routine x 2)     Status: None (Preliminary result)   Collection Time: 07/06/24 11:51 AM   Specimen: BLOOD RIGHT ARM  Result Value Ref Range Status   Specimen Description BLOOD RIGHT ARM  Final   Special Requests   Final    BOTTLES DRAWN AEROBIC AND ANAEROBIC Blood Culture adequate volume   Culture   Final    NO GROWTH < 24 HOURS Performed at [facility], [address], [place], [place] [address]    Report Status PENDING  Incomplete         Radiology Studies: MR BRAIN WO CONTRAST Result Date: 07/06/2024 EXAM: MRI BRAIN WITHOUT CONTRAST 07/06/2024 10:15:01 PM TECHNIQUE: Multiplanar multisequence MRI of the head/brain was performed without the administration of intravenous contrast. COMPARISON: None available. CLINICAL HISTORY: Seizure, new-onset, no history of trauma. FINDINGS: BRAIN AND VENTRICLES: No acute infarct. No intracranial hemorrhage. No mass. No midline shift. No hydrocephalus. Mild multifocal hyperintense T2-weighted signal within the cerebral white matter, most commonly  due to chronic small vessel disease. Mild volume loss not greater than expected for age. The hippocampi are normal and symmetric in size and signal. The hypothalamus and mammillary bodies are normal. There is no cortical ectopia or dysplasia. The sella is unremarkable. Normal flow voids. ORBITS: No significant abnormality. SINUSES AND MASTOIDS: No significant abnormality. BONES AND SOFT TISSUES: Normal marrow signal. No soft tissue abnormality. IMPRESSION: 1. No acute intracranial abnormality. 2. No epileptogenic lesion identified. Electronically signed by: Franky Stanford MD 07/06/2024 10:19 PM EST RP Workstation: [id number]   CT CHEST ABDOMEN PELVIS WO CONTRAST Result Date: 07/06/2024 EXAM: CT CHEST, ABDOMEN AND PELVIS WITHOUT CONTRAST 07/06/2024 02:31:11 PM TECHNIQUE: CT of the chest, abdomen and pelvis was performed without the administration of intravenous contrast. Multiplanar reformatted images are provided for review. Automated exposure control, iterative reconstruction, and/or weight based adjustment of the mA/kV was utilized to  reduce the radiation dose to as low as reasonably achievable. COMPARISON: CT chest 09/07/2022. CLINICAL HISTORY: Sepsis. FINDINGS: CHEST: MEDIASTINUM AND LYMPH NODES: Heart and pericardium are unremarkable. The central airways are clear. No mediastinal, hilar or axillary lymphadenopathy. LUNGS AND PLEURA: No focal consolidation or pulmonary edema. No pleural effusion. No pneumothorax. ABDOMEN AND PELVIS: LIVER: Attenuation in the liver consistent with hepatic steatosis. GALLBLADDER AND BILE DUCTS: Unremarkable. No biliary ductal dilatation. SPLEEN: No acute abnormality. PANCREAS: No acute abnormality. ADRENAL GLANDS: No acute abnormality. KIDNEYS, URETERS AND BLADDER: No stones in the kidneys or ureters. No hydronephrosis. No perinephric or periureteral stranding. Urinary bladder is unremarkable. GI AND BOWEL: Stomach demonstrates no acute abnormality. There is no bowel obstruction.  REPRODUCTIVE ORGANS: No acute abnormality. PERITONEUM AND RETROPERITONEUM: No ascites. No free air. VASCULATURE: Aorta is normal in caliber. ABDOMINAL AND PELVIS LYMPH NODES: No lymphadenopathy. REPRODUCTIVE ORGANS: No acute abnormality. BONES AND SOFT TISSUES: No acute osseous abnormality. No focal soft tissue abnormality. OVERALL ASSESSMENT: No evidence of infection in the chest, abdomen, or pelvis. No acute findings. IMPRESSION: 1. No evidence of infection in the chest, abdomen, or pelvis. 2. No acute findings. 3. Hepatic steatosis. Electronically signed by: Norleen Boxer MD 07/06/2024 03:30 PM EST RP Workstation: [id number]   CT Head Wo Contrast Result Date: 07/06/2024 EXAM: CT HEAD WITHOUT CONTRAST 07/06/2024 02:31:11 PM TECHNIQUE: CT of the head was performed without the administration of intravenous contrast. Automated exposure control, iterative reconstruction, and/or weight based adjustment of the mA/kV was utilized to reduce the radiation dose to as low as reasonably achievable. COMPARISON: 11/22/2023 CLINICAL HISTORY: Mental status change, unknown cause. FINDINGS: BRAIN AND VENTRICLES: No acute hemorrhage. No evidence of acute infarct. No hydrocephalus. No extra-axial collection. No mass effect or midline shift. Generalized cerebral volume loss and chronic small vessel ischemic disease changes are stable. Intracranial arterial calcification. ORBITS: No acute abnormality. SINUSES: Mild mucosal thickening in paranasal sinuses. Chronic rightward nasal septal deviation. SOFT TISSUES AND SKULL: No acute soft tissue abnormality. No skull fracture. IMPRESSION: 1. No acute intracranial abnormality. 2. Stable generalized cerebral volume loss and chronic small vessel ischemic disease changes. Electronically signed by: Lonni Necessary MD 07/06/2024 03:05 PM EST RP Workstation: [id number]   DG Chest Port 1 View Result Date: 07/06/2024 EXAM: 1 VIEW(S) XRAY OF THE CHEST 07/06/2024 12:09:00 PM COMPARISON:  02/16/2018 CLINICAL HISTORY: Questionable sepsis - evaluate for abnormality FINDINGS: LUNGS AND PLEURA: No focal pulmonary opacity. No pleural effusion. No pneumothorax. HEART AND MEDIASTINUM: Atherosclerotic calcifications. No acute abnormality of the cardiac and mediastinal silhouettes. BONES AND SOFT TISSUES: No acute osseous abnormality. IMPRESSION: 1. No acute findings. Electronically signed by: Waddell Calk MD 07/06/2024 12:58 PM EST RP Workstation: [id number]        Scheduled Meds:  folic acid   1 mg Oral Daily   heparin   5,000 Units Subcutaneous Q8H   multivitamin with minerals  1 tablet Oral Daily   sodium chloride  flush  3 mL Intravenous Q12H   thiamine   100 mg Oral Daily   Or   thiamine   100 mg Intravenous Daily   vancomycin  variable dose per unstable renal function (pharmacist dosing)   Does not apply See admin instructions   Continuous Infusions:  ceFEPime  (MAXIPIME ) IV     metronidazole  Stopped (07/07/24 0305)     LOS: 1 day    Time spent: 35 Minutes    Sharaya Boruff A Raider Valbuena, MD [facility]   If 7PM-7AM, please contact night-coverage [email or web address]  07/07/2024, 8:52 AM   "

## 2024-07-07 NOTE — Progress Notes (Signed)
 Patient admitted to the unit. Patient is confused. Disoriented X 4. CCMD notified. Skin assessment done with charge RN. Vital sign checked and documented. Bed alarm on.

## 2024-07-07 NOTE — Procedures (Signed)
 Patient Name: SABIEN UMLAND  MRN: [id number]  Epilepsy Attending: Arlin MALVA Krebs  Referring Physician/Provider: Seena Marsa NOVAK, MD  Date: 07/07/2024 Duration: 23 mins  Patient history: 79yo M with ams. EEG to evaluate for seizure  Level of alertness: Awake, asleep  AEDs during EEG study: None  Technical aspects: This EEG study was done with scalp electrodes positioned according to the 10-20 International system of electrode placement. Electrical activity was reviewed with band pass filter of 1-70Hz , sensitivity of 7 uV/mm, display speed of 20mm/sec with a 60Hz  notched filter applied as appropriate. EEG data were recorded continuously and digitally stored.  Video monitoring was available and reviewed as appropriate.  Description: The posterior dominant rhythm consists of 8 Hz activity of moderate voltage (25-35 uV) seen predominantly in posterior head regions, symmetric and reactive to eye opening and eye closing. Sleep was characterized by vertex waves, sleep spindles (12 to 14 Hz), maximal frontocentral region. Hyperventilation and photic stimulation were not performed.     IMPRESSION: This study is within normal limits. No seizures or epileptiform discharges were seen throughout the recording.  A normal interictal EEG does not exclude the diagnosis of epilepsy.  Chrissy Ealey O Sherol Sabas

## 2024-07-07 NOTE — Progress Notes (Signed)
 EEG complete - results pending

## 2024-07-07 NOTE — Evaluation (Signed)
 Clinical/Bedside Swallow Evaluation Patient Details  Name: SAHMIR WEATHERBEE MRN: [id number] Date of Birth: 23-Sep-1945  Today's Date: 07/07/2024 Time: SLP Start Time (ACUTE ONLY): 0945 SLP Stop Time (ACUTE ONLY): 0955 SLP Time Calculation (min) (ACUTE ONLY): 10 min  Past Medical History:  Past Medical History:  Diagnosis Date   Allergy    Seasonal   Atrial fibrillation with RVR (HCC)    Atrial flutter with rapid ventricular response (HCC) 06/18/2013   Dizziness 06/18/2013   due to inner ear infection   Elevated blood pressure    elevated in the office 07/14/13 though he does not carry a diagnosis of HTN   History of colonic diverticulitis 06/19/2008   Right-sided.   PVC's (premature ventricular contractions)    By monitor 05/22/13   Past Surgical History:  Past Surgical History:  Procedure Laterality Date   none     HPI:  DECODA VAN is a 79 y.o. male who presented to ED 1/18 by EMS with altered mental status and weakness. Seizure activeity en route to ED lasting 1 min. Patient was alone and at baseline is able to be independent.  His grandson does come to check on him every now and then.  Found him on the floor yesterday.  Tried to care for him overnight but patient was getting up every hour and remained confused and had several more falls.  EMS was called this morning. Also reported he was diagnosed/treated for the flu about a week ago. MRI 1/18 with no acute findings.  Chest CT 1/18 with no evidence of infection. No prior ST eval/treat in EMR. Pt with with medical history significant of hypertension, atrial fibrillation, alcohol  use.    Assessment / Plan / Recommendation  Clinical Impression  Pt presents with clinical indicators of pharyngeal dysphagia.  Pt initially defensive to oral stimulation, but accepted trials of ice chip, puree, and thin liquid by spoon and cup.  With ice chip there was prolonged oral phase and minimal manipulation of bolus prior to swallow.  Pt  tolerated small amounts of water by spoon. With small cup sip pt required 9+ swallows and there was watery eyes with tearing noted.  Pt did not appear to initiate pharyngeal swallow with small amount of puree placed in oral cavity, though laryngeal bobbing was noted.  SLP used suction to remove bolus from oral cavity.  SLP to follow for PO readiness.    Recommend pt remain NPO at present. Pt may have small amounts (~1/2 tsp) of unthickened water by spoon, after good oral care, in moderation, when fully awake/alert, with upright positioning and 1:1 assistance.   SLP Visit Diagnosis: Dysphagia, unspecified (R13.10)    Aspiration Risk  Moderate aspiration risk    Diet Recommendation NPO    Medication Administration: Via alternative means    Other Recommendations Oral Care Recommendations: Oral care prior to ice chip/H20;Oral care BID     Swallow Evaluation Recommendations  See above   Assistance Recommended at Discharge  N/A  Functional Status Assessment Patient has had a recent decline in their functional status and demonstrates the ability to make significant improvements in function in a reasonable and predictable amount of time.  Frequency and Duration min 2x/week  2 weeks       Prognosis Prognosis for improved oropharyngeal function: Good      Swallow Study   General Date of Onset: 07/06/24 HPI: AVROM ROBARTS is a 79 y.o. male who presented to ED 1/18 by EMS with altered mental  status and weakness. Seizure activeity en route to ED lasting 1 min. Patient was alone and at baseline is able to be independent.  His grandson does come to check on him every now and then.  Found him on the floor yesterday.  Tried to care for him overnight but patient was getting up every hour and remained confused and had several more falls.  EMS was called this morning. Also reported he was diagnosed/treated for the flu about a week ago. MRI 1/18 with no acute findings.  Chest CT 1/18 with no evidence  of infection. No prior ST eval/treat in EMR. Pt with with medical history significant of hypertension, atrial fibrillation, alcohol  use. Type of Study: Bedside Swallow Evaluation Previous Swallow Assessment: None Diet Prior to this Study: NPO Temperature Spikes Noted: No Respiratory Status: Room air History of Recent Intubation: No Behavior/Cognition: Alert;Confused Oral Cavity Assessment: Dried secretions (labial bleeding) Oral Care Completed by SLP: No Oral Cavity - Dentition: Adequate natural dentition Self-Feeding Abilities: Total assist Patient Positioning: Upright in bed Baseline Vocal Quality: Not observed Volitional Cough: Cognitively unable to elicit Volitional Swallow: Unable to elicit    Oral/Motor/Sensory Function Overall Oral Motor/Sensory Function:  (unable to assess)   Ice Chips Ice chips: Impaired Oral Phase Impairments: Poor awareness of bolus Oral Phase Functional Implications: Oral holding Pharyngeal Phase Impairments: Suspected delayed Swallow   Thin Liquid Thin Liquid: Impaired Presentation: Cup;Spoon Oral Phase Functional Implications: Right anterior spillage Pharyngeal  Phase Impairments: Multiple swallows    Nectar Thick Nectar Thick Liquid: Not tested   Honey Thick Honey Thick Liquid: Not tested   Puree Puree: Impaired Oral Phase Impairments: Poor awareness of bolus Oral Phase Functional Implications: Oral holding Pharyngeal Phase Impairments: Unable to trigger swallow;Suspected delayed Swallow Other Comments: removed majority of bolus via suction   Solid     Solid: Not tested      Anette FORBES Grippe, MA, CCC-SLP Acute Rehabilitation Services Office: [phone] 07/07/2024,10:14 AM

## 2024-07-07 NOTE — Plan of Care (Signed)
" °  Problem: Self-Concept: Goal: Level of anxiety will decrease Outcome: Progressing Goal: Ability to verbalize feelings about condition will improve Outcome: Progressing   Problem: Education: Goal: Knowledge of General Education information will improve Description: Including pain rating scale, medication(s)/side effects and non-pharmacologic comfort measures Outcome: Progressing   "

## 2024-07-08 ENCOUNTER — Inpatient Hospital Stay (HOSPITAL_COMMUNITY)

## 2024-07-08 LAB — RESPIRATORY PANEL BY PCR

## 2024-07-08 LAB — CBC
HCT: 26.5 % — ABNORMAL LOW (ref 39.0–52.0)
Hemoglobin: 9.6 g/dL — ABNORMAL LOW (ref 13.0–17.0)
MCH: 38.4 pg — ABNORMAL HIGH (ref 26.0–34.0)
MCHC: 36.2 g/dL — ABNORMAL HIGH (ref 30.0–36.0)
MCV: 106 fL — ABNORMAL HIGH (ref 80.0–100.0)
Platelets: 64 K/uL — ABNORMAL LOW (ref 150–400)
RBC: 2.5 MIL/uL — ABNORMAL LOW (ref 4.22–5.81)
RDW: 13.3 % (ref 11.5–15.5)
WBC: 4.7 K/uL (ref 4.0–10.5)
nRBC: 0 % (ref 0.0–0.2)

## 2024-07-08 LAB — COMPREHENSIVE METABOLIC PANEL WITH GFR
ALT: 68 U/L — ABNORMAL HIGH (ref 0–44)
AST: 155 U/L — ABNORMAL HIGH (ref 15–41)
Albumin: 2.7 g/dL — ABNORMAL LOW (ref 3.5–5.0)
Alkaline Phosphatase: 52 U/L (ref 38–126)
Anion gap: 13 (ref 5–15)
BUN: 38 mg/dL — ABNORMAL HIGH (ref 8–23)
CO2: 26 mmol/L (ref 22–32)
Calcium: 7.3 mg/dL — ABNORMAL LOW (ref 8.9–10.3)
Chloride: 99 mmol/L (ref 98–111)
Creatinine, Ser: 1.67 mg/dL — ABNORMAL HIGH (ref 0.61–1.24)
GFR, Estimated: 41 mL/min — ABNORMAL LOW
Glucose, Bld: 177 mg/dL — ABNORMAL HIGH (ref 70–99)
Potassium: 2.8 mmol/L — ABNORMAL LOW (ref 3.5–5.1)
Sodium: 138 mmol/L (ref 135–145)
Total Bilirubin: 1 mg/dL (ref 0.0–1.2)
Total Protein: 4.5 g/dL — ABNORMAL LOW (ref 6.5–8.1)

## 2024-07-08 LAB — OSMOLALITY: Osmolality: 298 mosm/kg — ABNORMAL HIGH (ref 275–295)

## 2024-07-08 LAB — CK: Total CK: 474 U/L — ABNORMAL HIGH (ref 49–397)

## 2024-07-08 LAB — TYPE AND SCREEN
ABO/RH(D): A POS
Antibody Screen: NEGATIVE

## 2024-07-08 LAB — HEPATITIS PANEL, ACUTE
HCV Ab: NONREACTIVE
Hep A IgM: NONREACTIVE
Hep B C IgM: NONREACTIVE
Hepatitis B Surface Ag: NONREACTIVE

## 2024-07-08 LAB — C-REACTIVE PROTEIN: CRP: 1.3 mg/dL — ABNORMAL HIGH

## 2024-07-08 LAB — URIC ACID: Uric Acid, Serum: 8.1 mg/dL (ref 3.7–8.6)

## 2024-07-08 LAB — MAGNESIUM: Magnesium: 1.3 mg/dL — ABNORMAL LOW (ref 1.7–2.4)

## 2024-07-08 LAB — ABO/RH: ABO/RH(D): A POS

## 2024-07-08 LAB — PROCALCITONIN: Procalcitonin: 1.29 ng/mL

## 2024-07-08 MED ORDER — MAGNESIUM SULFATE 4 GM/100ML IV SOLN
4.0000 g | Freq: Once | INTRAVENOUS | Status: AC
  Administered 2024-07-08: 4 g via INTRAVENOUS
  Filled 2024-07-08: qty 100

## 2024-07-08 MED ORDER — METOPROLOL TARTRATE 5 MG/5ML IV SOLN: 5.0000 mg | Freq: Three times a day (TID) | INTRAVENOUS | Status: DC | PRN

## 2024-07-08 MED ORDER — PROPRANOLOL HCL 10 MG PO TABS
20.0000 mg | ORAL_TABLET | Freq: Two times a day (BID) | ORAL | Status: DC
  Administered 2024-07-09 – 2024-07-14 (×9): 20 mg via ORAL
  Filled 2024-07-08 (×11): qty 2

## 2024-07-08 MED ORDER — POTASSIUM CHLORIDE 10 MEQ/100ML IV SOLN
10.0000 meq | INTRAVENOUS | Status: AC
  Administered 2024-07-08 (×2): 10 meq via INTRAVENOUS
  Filled 2024-07-08 (×2): qty 100

## 2024-07-08 MED ORDER — CYANOCOBALAMIN 1000 MCG/ML IJ SOLN
1000.0000 ug | Freq: Every day | INTRAMUSCULAR | Status: AC
  Administered 2024-07-08 – 2024-07-10 (×3): 1000 ug via SUBCUTANEOUS
  Filled 2024-07-08 (×3): qty 1

## 2024-07-08 MED ORDER — POTASSIUM CHLORIDE 10 MEQ/100ML IV SOLN
10.0000 meq | INTRAVENOUS | Status: DC
  Administered 2024-07-08 (×2): 10 meq via INTRAVENOUS
  Filled 2024-07-08 (×3): qty 100

## 2024-07-08 MED ORDER — ACYCLOVIR 5 % EX OINT
1.0000 | TOPICAL_OINTMENT | CUTANEOUS | Status: AC
  Administered 2024-07-08 – 2024-07-12 (×28): 1 via TOPICAL
  Filled 2024-07-08: qty 15

## 2024-07-08 MED ORDER — DEXTROSE-SODIUM CHLORIDE 5-0.45 % IV SOLN: INTRAVENOUS | Status: AC

## 2024-07-08 MED ORDER — HEPARIN SODIUM (PORCINE) 5000 UNIT/ML IJ SOLN: 5000.0000 [IU] | Freq: Three times a day (TID) | INTRAMUSCULAR | Status: DC

## 2024-07-08 MED ORDER — POTASSIUM CHLORIDE 2 MEQ/ML IV SOLN
INTRAVENOUS | Status: AC
  Filled 2024-07-08: qty 1000

## 2024-07-08 MED ORDER — VITAMIN B-12 1000 MCG PO TABS
1000.0000 ug | ORAL_TABLET | Freq: Every day | ORAL | Status: DC
  Administered 2024-07-11 – 2024-07-16 (×6): 1000 ug via ORAL
  Filled 2024-07-08 (×6): qty 1

## 2024-07-08 MED ORDER — SODIUM CHLORIDE 0.9 % IV SOLN
1.0000 g | INTRAVENOUS | Status: DC
  Administered 2024-07-08 – 2024-07-09 (×2): 1 g via INTRAVENOUS
  Filled 2024-07-08 (×3): qty 10

## 2024-07-08 MED ORDER — POTASSIUM CHLORIDE 2 MEQ/ML IV SOLN
INTRAVENOUS | Status: DC
  Filled 2024-07-08: qty 1000

## 2024-07-08 NOTE — Plan of Care (Signed)
" °  Problem: Fluid Volume: Goal: Hemodynamic stability will improve Outcome: Progressing   Problem: Clinical Measurements: Goal: Diagnostic test results will improve Outcome: Progressing Goal: Signs and symptoms of infection will decrease Outcome: Progressing   Problem: Respiratory: Goal: Ability to maintain adequate ventilation will improve Outcome: Progressing   Problem: Safety: Goal: Non-violent Restraint(s) Outcome: Progressing   Problem: Education: Goal: Expressions of having a comfortable level of knowledge regarding the disease process will increase Outcome: Progressing   Problem: Coping: Goal: Ability to adjust to condition or change in health will improve Outcome: Progressing Goal: Ability to identify appropriate support needs will improve Outcome: Progressing   Problem: Health Behavior/Discharge Planning: Goal: Compliance with prescribed medication regimen will improve Outcome: Progressing   Problem: Medication: Goal: Risk for medication side effects will decrease Outcome: Progressing   Problem: Clinical Measurements: Goal: Complications related to the disease process, condition or treatment will be avoided or minimized Outcome: Progressing Goal: Diagnostic test results will improve Outcome: Progressing   Problem: Safety: Goal: Verbalization of understanding the information provided will improve Outcome: Progressing   Problem: Self-Concept: Goal: Level of anxiety will decrease Outcome: Progressing Goal: Ability to verbalize feelings about condition will improve Outcome: Progressing   Problem: Education: Goal: Knowledge of General Education information will improve Description: Including pain rating scale, medication(s)/side effects and non-pharmacologic comfort measures Outcome: Progressing   Problem: Health Behavior/Discharge Planning: Goal: Ability to manage health-related needs will improve Outcome: Progressing   Problem: Clinical  Measurements: Goal: Ability to maintain clinical measurements within normal limits will improve Outcome: Progressing Goal: Will remain free from infection Outcome: Progressing Goal: Diagnostic test results will improve Outcome: Progressing Goal: Respiratory complications will improve Outcome: Progressing Goal: Cardiovascular complication will be avoided Outcome: Progressing   Problem: Activity: Goal: Risk for activity intolerance will decrease Outcome: Progressing   Problem: Nutrition: Goal: Adequate nutrition will be maintained Outcome: Progressing   Problem: Coping: Goal: Level of anxiety will decrease Outcome: Progressing   Problem: Elimination: Goal: Will not experience complications related to bowel motility Outcome: Progressing Goal: Will not experience complications related to urinary retention Outcome: Progressing   Problem: Pain Managment: Goal: General experience of comfort will improve and/or be controlled Outcome: Progressing   Problem: Safety: Goal: Ability to remain free from injury will improve Outcome: Progressing   Problem: Skin Integrity: Goal: Risk for impaired skin integrity will decrease Outcome: Progressing   "

## 2024-07-08 NOTE — Progress Notes (Signed)
 " PROGRESS NOTE    Gabriel Everett  FMW:[id number] DOB: Nov 04, 1945 DOA: 07/06/2024 PCP: [name], FNP   Brief Narrative: 79 year old with past medical history significant for hypertension, A-fib, alcohol  use presented with altered mental status and weakness.  Patient was found down on the floor by family.  EMS was contacted.  Patient was noted to be confused.  Patient was recently treated for the flu a week ago.  Patient was noted to have multiple skin tears.  On EMS evaluation initial blood pressure was in the 70s.  Improved with IV fluids.  Patient and route had seizure lasting 1 minute with tonic-clonic activity.  Evaluation in the ED presented with a creatinine of 2.7 baseline of 1, bilirubin 3.3, white blood cell 11, hemoglobin 11, platelets 108, INR 1.5, lactic acid 7 subsequently repeated 5.  Respiratory panel, flu COVID RSV negative.  Chest x-ray showed no acute abnormality.  CT head showed no acute abnormality.  CT of chest abdomen and pelvis showed no acute abnormality but did show some hepatic steatosis   Assessment & Plan:   1-Acute metabolic encephalopathy, down for several hours at home, severe dehydration, hypokalemia, hypomagnesemia, questionable Seizure-like activity versus myoclonic jerks. Was found down by family, patient although somnolent tells me today that he was down for several hours, MRI brain and EEG nonacute, he denies any headache, he appears severely dehydrated with severe electrolyte abnormalities, being aggressively hydrated, electrolytes replaced, PT OT and speech.  Continue to monitor mentation is improving likely will take another day or 2 to come back to baseline.     SIRS Hypothermia Due to being exposed to elements and down for several hours, supportive care, TSH stable, B12 borderline low which will be replaced, no signs of infectious focus sipped possible mild UTI.  Taper down antibiotics for possible mild UTI.  Coffee Ground emesis;  Read by family,  IV PPI, monitor H&H, some fall in H&H is due to heme dilution and is getting aggressive hydration.  Monitor no reoccurrence of the same  AKI Metabolic acidosis elevated anion gap Due to dehydration, baseline creatinine around 1, hydrate, renal function improving, monitor bladder scans, check CK.  Hypertension - Hold propranolol  due to soft blood pressure  A-fib - Hold beta-blocker to avoid future hypotension -Was not on anticoagulation prior to admission  Alcohol  use ? Alcohol   withdrawal -Drinks 2 alcoholic mixed with , 2 shots of vodka daily - High dose  thiamine  for 3 days, no signs of DTs he tells me that he drinks some alcohol  but not too much, no tremors.  Monitor off of CIWA.  Thrombocytopenia could be related to infectious process versus alcohol .  Monitor  Mild asymptomatic transaminitis.  Will check viral panel respiratory and hepatitis, check right upper quadrant ultrasound.  Likely shock liver from hypotension and dehydration   Estimated body mass index is 22.24 kg/m as calculated from the following:   Height as of this encounter: 5' 10 (1.778 m).   Weight as of this encounter: 70.3 kg.   DVT prophylaxis: SCD  Code Status: Full code   Family Communication: Grandson called on his cell phone 07/08/2024 at 9:45 AM, mailbox full Disposition Plan:  Status is: Inpatient Remains inpatient appropriate because: Management of acute encephalopathy AKI    Consultants:  None  Procedures:  none  Antimicrobials:    Subjective: Patient in bed, although somnolent easily arousable, denies any headache chest or abdominal pain, he is having some backache and bodyaches says he fell and was  on the floor for several hours, says drinks some alcohol  but not too much.  Objective: Vitals:   07/08/24 0146 07/08/24 0204 07/08/24 0400 07/08/24 0757  BP: 126/80  124/60 121/85  Pulse: 73 76 87   Resp: (!) 21 19 19    Temp:   99 F (37.2 C) 98.5 F (36.9 C)  TempSrc:   Axillary  Axillary  SpO2: 98% 98% 96%   Weight:      Height:        Intake/Output Summary (Last 24 hours) at 07/08/2024 1001 Last data filed at 07/08/2024 0430 Gross per 24 hour  Intake 703.02 ml  Output 1250 ml  Net -546.98 ml   Filed Weights   07/06/24 1136  Weight: 70.3 kg    Examination:  Patient in bed, somnolent but answers questions, following basic commands, appears extremely dehydrated and frail, [place].AT,PERRAL Supple Neck, No JVD,   Symmetrical Chest wall movement, Good air movement bilaterally, CTAB RRR,No Gallops, Rubs or new Murmurs,  +ve B.Sounds, Abd Soft, No tenderness,   No Cyanosis, Clubbing or edema     Data Review:   Patient Lines/Drains/Airways Status     Active Line/Drains/Airways     Name Placement date Placement time Site Days   Peripheral IV 07/06/24 20 G Left Antecubital 07/06/24  1100  Antecubital  2   Peripheral IV 07/07/24 18 G Anterior;Right Forearm 07/07/24  1122  Forearm  1   External Urinary Catheter 07/07/24  1307  --  1   Wound 07/07/24 1246 Other (Comment) Arm Lower;Posterior;Right 07/07/24  1246  Arm  1   Wound 07/07/24 1247 Hand Left;Posterior 07/07/24  1247  Hand  1   Wound 07/07/24 1250 Pressure Injury Buttocks Deep Tissue Pressure Injury - Purple or maroon localized area of discolored intact skin or blood-filled blister due to damage of underlying soft tissue from pressure and/or shear. 07/07/24  1250  Buttocks  1   Wound 07/07/24 1300 Traumatic Arm Left;Posterior;Upper 07/07/24  1300  Arm  1   Wound 07/07/24 1300 Traumatic Arm Left;Lower;Posterior;Proximal 07/07/24  1300  Arm  1             Inpatient Medications  Scheduled Meds:  acyclovir  ointment  1 Application Topical Q3H   folic acid   1 mg Oral Daily   multivitamin with minerals  1 tablet Oral Daily   pantoprazole  (PROTONIX ) IV  40 mg Intravenous Q12H   propranolol   20 mg Oral BID   thiamine   100 mg Oral Daily   Continuous Infusions:  cefTRIAXone  (ROCEPHIN )  IV      dextrose  5 % and 0.45 % NaCl     lactated ringers  1,000 mL with potassium chloride  60 mEq infusion     magnesium  sulfate bolus IVPB 4 g (07/08/24 0833)   potassium chloride      thiamine  (VITAMIN B1) injection 500 mg (07/07/24 1504)   PRN Meds:.acetaminophen  **OR** acetaminophen , ipratropium-albuterol , metoprolol  tartrate, polyethylene glycol  DVT Prophylaxis   Recent Labs  Lab 07/06/24 1210 07/06/24 1217 07/07/24 0349 07/08/24 0306  WBC  --  11.0* 9.2 4.7  HGB 12.2* 11.4* 10.6* 9.6*  HCT 36.0* 34.7* 29.7* 26.5*  PLT  --  108* 101* 64*  MCV  --  118.0* 111.2* 106.0*  MCH  --  38.8* 39.7* 38.4*  MCHC  --  32.9 35.7 36.2*  RDW  --  13.5 13.7 13.3  LYMPHSABS  --  0.7  --   --   MONOABS  --  0.6  --   --  EOSABS  --  0.0  --   --   BASOSABS  --  0.0  --   --     Recent Labs  Lab 07/06/24 1210 07/06/24 1217 07/06/24 1408 07/06/24 2236 07/07/24 0349 07/07/24 0404 07/07/24 1330 07/08/24 0306 07/08/24 0609  NA 134* 135  --   --  137  --   --  138  --   K 4.3 4.5  --   --  4.3  --   --  2.8*  --   CL 102 94*  --   --  96*  --   --  99  --   CO2  --  10*  --   --  15*  --   --  26  --   ANIONGAP  --  32*  --   --  25*  --   --  13  --   GLUCOSE 82 84  --   --  111*  --   --  177*  --   BUN 40* 37*  --   --  55*  --   --  38*  --   CREATININE 3.10* 2.74*  --   --  3.08*  --   --  1.67*  --   AST  --  92*  --   --  121*  --   --  155*  --   ALT  --  46*  --   --  57*  --   --  68*  --   ALKPHOS  --  63  --   --  51  --   --  52  --   BILITOT  --  3.3*  --   --  1.8*  --   --  1.0  --   ALBUMIN  --  3.4*  --   --  3.1*  --   --  2.7*  --   CRP  --   --   --   --   --   --   --   --  1.3*  PROCALCITON  --   --   --   --   --   --   --   --  1.29  LATICACIDVEN 7.3*  --  5.6* 3.1*  --  2.0*  --   --   --   INR  --  1.5*  --   --   --   --   --   --   --   TSH  --   --   --   --  5.340*  --   --   --   --   AMMONIA  --   --   --   --   --   --  37*  --   --   MG  --  1.7   --   --   --   --   --   --  1.3*  CALCIUM  --  8.0*  --   --  7.5*  --   --  7.3*  --       Recent Labs  Lab 07/06/24 1210 07/06/24 1217 07/06/24 1408 07/06/24 2236 07/07/24 0349 07/07/24 0404 07/07/24 1330 07/08/24 0306 07/08/24 0609  CRP  --   --   --   --   --   --   --   --  1.3*  PROCALCITON  --   --   --   --   --   --   --   --  1.29  LATICACIDVEN 7.3*  --  5.6* 3.1*  --  2.0*  --   --   --   INR  --  1.5*  --   --   --   --   --   --   --   TSH  --   --   --   --  5.340*  --   --   --   --   AMMONIA  --   --   --   --   --   --  37*  --   --   MG  --  1.7  --   --   --   --   --   --  1.3*  CALCIUM  --  8.0*  --   --  7.5*  --   --  7.3*  --     --------------------------------------------------------------------------------------------------------------- Lab Results  Component Value Date   CHOL 153 11/24/2023   HDL 52 11/24/2023   LDLCALC 78 11/24/2023   TRIG 114 11/24/2023   CHOLHDL 2.9 11/24/2023    Lab Results  Component Value Date   HGBA1C 4.6 (L) 11/23/2023   Recent Labs    07/07/24 0349  TSH 5.340*   Recent Labs    07/07/24 0349  VITAMINB12 295   ------------------------------------------------------------------------------------------------------------------ Cardiac Enzymes No results for input(s): CKMB, TROPONINI, MYOGLOBIN in the last 168 hours.  Invalid input(s): CK  Micro Results Recent Results (from the past 240 hours)  Resp panel by RT-PCR (RSV, Flu A&B, Covid) Anterior Nasal Swab     Status: None   Collection Time: 07/06/24 11:46 AM   Specimen: Anterior Nasal Swab  Result Value Ref Range Status   SARS Coronavirus 2 by RT PCR NEGATIVE NEGATIVE Final   Influenza A by PCR NEGATIVE NEGATIVE Final   Influenza B by PCR NEGATIVE NEGATIVE Final    Comment: (NOTE) The Xpert Xpress SARS-CoV-2/FLU/RSV plus assay is intended as an aid in the diagnosis of influenza from Nasopharyngeal swab specimens and should not be used as a  sole basis for treatment. Nasal washings and aspirates are unacceptable for Xpert Xpress SARS-CoV-2/FLU/RSV testing.  Fact Sheet for Patients: bloggercourse.com  Fact Sheet for Healthcare Providers: seriousbroker.it  This test is not yet approved or cleared by the [place]  FDA and has been authorized for detection and/or diagnosis of SARS-CoV-2 by FDA under an Emergency Use Authorization (EUA). This EUA will remain in effect (meaning this test can be used) for the duration of the COVID-19 declaration under Section 564(b)(1) of the Act, 21 U.S.C. section 360bbb-3(b)(1), unless the authorization is terminated or revoked.     Resp Syncytial Virus by PCR NEGATIVE NEGATIVE Final    Comment: (NOTE) Fact Sheet for Patients: bloggercourse.com  Fact Sheet for Healthcare Providers: seriousbroker.it  This test is not yet approved or cleared by the [place]  FDA and has been authorized for detection and/or diagnosis of SARS-CoV-2 by FDA under an Emergency Use Authorization (EUA). This EUA will remain in effect (meaning this test can be used) for the duration of the COVID-19 declaration under Section 564(b)(1) of the Act, 21 U.S.C. section 360bbb-3(b)(1), unless the authorization is terminated or revoked.  Performed at [facility], [address] [place], [place] [address]   Blood Culture (routine x 2)     Status: None (Preliminary result)   Collection Time: 07/06/24 11:51 AM   Specimen: BLOOD RIGHT ARM  Result Value Ref Range Status   Specimen Description  BLOOD RIGHT ARM  Final   Special Requests   Final    BOTTLES DRAWN AEROBIC AND ANAEROBIC Blood Culture adequate volume   Culture   Final    NO GROWTH 2 DAYS Performed at [facility], [address] [place], [place] [address]    Report Status PENDING  Incomplete  MRSA Next Gen by PCR, Nasal     Status: None    Collection Time: 07/07/24  5:27 PM   Specimen: Nasal Mucosa; Nasal Swab  Result Value Ref Range Status   MRSA by PCR Next Gen NOT DETECTED NOT DETECTED Final    Comment: (NOTE) The GeneXpert MRSA Assay (FDA approved for NASAL specimens only), is one component of a comprehensive MRSA colonization surveillance program. It is not intended to diagnose MRSA infection nor to guide or monitor treatment for MRSA infections. Test performance is not FDA approved in patients less than 107 years old. Performed at [facility], [address], [place], [place] [address]     Radiology Reports  DG Chest [place] 1 View Result Date: 07/08/2024 CLINICAL DATA:  Shortness of breath. EXAM: PORTABLE CHEST 1 VIEW COMPARISON:  07/06/2024 FINDINGS: The lungs are clear without focal pneumonia, edema, pneumothorax or pleural effusion. Cardiopericardial silhouette is at upper limits of normal for size. No acute bony abnormality. Telemetry leads overlie the chest. IMPRESSION: No active disease. Electronically Signed   By: Camellia Candle M.D.   On: 07/08/2024 06:15   EEG adult Result Date: 07/07/2024 Shelton Arlin KIDD, MD     07/07/2024  4:14 PM Patient Name: Gabriel Everett MRN: [id number] Epilepsy Attending: Arlin KIDD Shelton Referring Physician/Provider: Seena Marsa NOVAK, MD Date: 07/07/2024 Duration: 23 mins Patient history: 79yo M with ams. EEG to evaluate for seizure Level of alertness: Awake, asleep AEDs during EEG study: None Technical aspects: This EEG study was done with scalp electrodes positioned according to the 10-20 International system of electrode placement. Electrical activity was reviewed with band pass filter of 1-70Hz , sensitivity of 7 uV/mm, display speed of 31mm/sec with a 60Hz  notched filter applied as appropriate. EEG data were recorded continuously and digitally stored.  Video monitoring was available and reviewed as appropriate. Description: The posterior dominant rhythm consists of 8 Hz  activity of moderate voltage (25-35 uV) seen predominantly in posterior head regions, symmetric and reactive to eye opening and eye closing. Sleep was characterized by vertex waves, sleep spindles (12 to 14 Hz), maximal frontocentral region. Hyperventilation and photic stimulation were not performed.   IMPRESSION: This study is within normal limits. No seizures or epileptiform discharges were seen throughout the recording. A normal interictal EEG does not exclude the diagnosis of epilepsy. Arlin KIDD Shelton   MR BRAIN WO CONTRAST Result Date: 07/06/2024 EXAM: MRI BRAIN WITHOUT CONTRAST 07/06/2024 10:15:01 PM TECHNIQUE: Multiplanar multisequence MRI of the head/brain was performed without the administration of intravenous contrast. COMPARISON: None available. CLINICAL HISTORY: Seizure, new-onset, no history of trauma. FINDINGS: BRAIN AND VENTRICLES: No acute infarct. No intracranial hemorrhage. No mass. No midline shift. No hydrocephalus. Mild multifocal hyperintense T2-weighted signal within the cerebral white matter, most commonly due to chronic small vessel disease. Mild volume loss not greater than expected for age. The hippocampi are normal and symmetric in size and signal. The hypothalamus and mammillary bodies are normal. There is no cortical ectopia or dysplasia. The sella is unremarkable. Normal flow voids. ORBITS: No significant abnormality. SINUSES AND MASTOIDS: No significant abnormality. BONES AND SOFT TISSUES: Normal marrow signal. No  soft tissue abnormality. IMPRESSION: 1. No acute intracranial abnormality. 2. No epileptogenic lesion identified. Electronically signed by: Franky Stanford MD 07/06/2024 10:19 PM EST RP Workstation: [id number]   CT CHEST ABDOMEN PELVIS WO CONTRAST Result Date: 07/06/2024 EXAM: CT CHEST, ABDOMEN AND PELVIS WITHOUT CONTRAST 07/06/2024 02:31:11 PM TECHNIQUE: CT of the chest, abdomen and pelvis was performed without the administration of intravenous contrast. Multiplanar  reformatted images are provided for review. Automated exposure control, iterative reconstruction, and/or weight based adjustment of the mA/kV was utilized to reduce the radiation dose to as low as reasonably achievable. COMPARISON: CT chest 09/07/2022. CLINICAL HISTORY: Sepsis. FINDINGS: CHEST: MEDIASTINUM AND LYMPH NODES: Heart and pericardium are unremarkable. The central airways are clear. No mediastinal, hilar or axillary lymphadenopathy. LUNGS AND PLEURA: No focal consolidation or pulmonary edema. No pleural effusion. No pneumothorax. ABDOMEN AND PELVIS: LIVER: Attenuation in the liver consistent with hepatic steatosis. GALLBLADDER AND BILE DUCTS: Unremarkable. No biliary ductal dilatation. SPLEEN: No acute abnormality. PANCREAS: No acute abnormality. ADRENAL GLANDS: No acute abnormality. KIDNEYS, URETERS AND BLADDER: No stones in the kidneys or ureters. No hydronephrosis. No perinephric or periureteral stranding. Urinary bladder is unremarkable. GI AND BOWEL: Stomach demonstrates no acute abnormality. There is no bowel obstruction. REPRODUCTIVE ORGANS: No acute abnormality. PERITONEUM AND RETROPERITONEUM: No ascites. No free air. VASCULATURE: Aorta is normal in caliber. ABDOMINAL AND PELVIS LYMPH NODES: No lymphadenopathy. REPRODUCTIVE ORGANS: No acute abnormality. BONES AND SOFT TISSUES: No acute osseous abnormality. No focal soft tissue abnormality. OVERALL ASSESSMENT: No evidence of infection in the chest, abdomen, or pelvis. No acute findings. IMPRESSION: 1. No evidence of infection in the chest, abdomen, or pelvis. 2. No acute findings. 3. Hepatic steatosis. Electronically signed by: Norleen Boxer MD 07/06/2024 03:30 PM EST RP Workstation: [id number]   CT Head Wo Contrast Result Date: 07/06/2024 EXAM: CT HEAD WITHOUT CONTRAST 07/06/2024 02:31:11 PM TECHNIQUE: CT of the head was performed without the administration of intravenous contrast. Automated exposure control, iterative reconstruction, and/or  weight based adjustment of the mA/kV was utilized to reduce the radiation dose to as low as reasonably achievable. COMPARISON: 11/22/2023 CLINICAL HISTORY: Mental status change, unknown cause. FINDINGS: BRAIN AND VENTRICLES: No acute hemorrhage. No evidence of acute infarct. No hydrocephalus. No extra-axial collection. No mass effect or midline shift. Generalized cerebral volume loss and chronic small vessel ischemic disease changes are stable. Intracranial arterial calcification. ORBITS: No acute abnormality. SINUSES: Mild mucosal thickening in paranasal sinuses. Chronic rightward nasal septal deviation. SOFT TISSUES AND SKULL: No acute soft tissue abnormality. No skull fracture. IMPRESSION: 1. No acute intracranial abnormality. 2. Stable generalized cerebral volume loss and chronic small vessel ischemic disease changes. Electronically signed by: Lonni Necessary MD 07/06/2024 03:05 PM EST RP Workstation: [id number]   DG Chest Port 1 View Result Date: 07/06/2024 EXAM: 1 VIEW(S) XRAY OF THE CHEST 07/06/2024 12:09:00 PM COMPARISON: 02/16/2018 CLINICAL HISTORY: Questionable sepsis - evaluate for abnormality FINDINGS: LUNGS AND PLEURA: No focal pulmonary opacity. No pleural effusion. No pneumothorax. HEART AND MEDIASTINUM: Atherosclerotic calcifications. No acute abnormality of the cardiac and mediastinal silhouettes. BONES AND SOFT TISSUES: No acute osseous abnormality. IMPRESSION: 1. No acute findings. Electronically signed by: Waddell Calk MD 07/06/2024 12:58 PM EST RP Workstation: [id number]      Signature  -   Lavada Stank M.D on 07/08/2024 at 10:01 AM   -  To page go to [email or web address]       07/08/2024, 10:01 AM   "

## 2024-07-08 NOTE — TOC CM/SW Note (Signed)
 Transition of Care [facility]) - Inpatient Brief Assessment   Patient Details  Name: Gabriel Everett MRN: [id number] Date of Birth: 07/23/1945  Transition of Care [facility]) CM/SW Contact:    Almarie CHRISTELLA Goodie, LCSW Phone Number: 07/08/2024, 4:14 PM   Clinical Narrative:     Per chart review, patient from home with grandson, previously independent. At this time, patient fully disoriented, unable to participate in assessment. CSW acknowledging consult for substance abuse counseling, resources placed on AVS. CSW to follow for needs when appropriate.    Transition of Care Asessment: Insurance and Status: Insurance coverage has been reviewed Patient has primary care physician: Yes Home environment has been reviewed: Home with grandson Prior level of function:: Independent Prior/Current Home Services: No current home services Social Drivers of Health Review: SDOH reviewed no interventions necessary Readmission risk has been reviewed: Yes Transition of care needs: transition of care needs identified, TOC will continue to follow

## 2024-07-08 NOTE — Evaluation (Signed)
 Occupational Therapy Evaluation Patient Details Name: Gabriel Everett MRN: [id number] DOB: [birth year] Today's Date: 07/08/2024   History of Present Illness   79 y.o. male presents 07/06/24 with AMS and weakness. Possible seizure activity en route to ED.  Found to have hypotension, AKI, elevated lactic acid, mild leukocytosis. No source for infection, concern for SIRS/sepsis versus seizure. EEG study results within normal limits.  PMH: HTN, afib, and alcohol  use.     Clinical Impressions Per chart review, Pt was independent with functional mobility and ADL tasks PTA. Pt currently requires Max A for ADL engagement and Max A +2 for functional transfers. Pt primarily limited by generalized weakness, decreased activity tolerance, unsteadiness on feet, pain, and difficulty expressing self. OT to continue to follow Pt acutely to facilitate progress towards goals. Patient will benefit from continued inpatient follow up therapy, <3 hours/day.      If plan is discharge home, recommend the following:   Two people to help with walking and/or transfers;Two people to help with bathing/dressing/bathroom;Assistance with cooking/housework;Assistance with feeding;Direct supervision/assist for medications management;Direct supervision/assist for financial management;Assist for transportation;Help with stairs or ramp for entrance     Functional Status Assessment   Patient has had a recent decline in their functional status and demonstrates the ability to make significant improvements in function in a reasonable and predictable amount of time.     Equipment Recommendations   Other (comment) (defer)     Recommendations for Other Services         Precautions/Restrictions   Precautions Precautions: Fall Recall of Precautions/Restrictions: Impaired Restrictions Weight Bearing Restrictions Per Provider Order: No     Mobility Bed Mobility Overal bed mobility: Needs Assistance Bed Mobility:  Supine to Sit, Sit to Supine     Supine to sit: Max assist, +2 for physical assistance, +2 for safety/equipment Sit to supine: Max assist, +2 for physical assistance, +2 for safety/equipment   General bed mobility comments: Pt required Max A to initiate movement to come to EOB. Max A for trunk and BLE management. Use of bad pad to scoot hips forward. Pt required posterior support in sitting    Transfers Overall transfer level: Needs assistance Equipment used: 2 person hand held assist Transfers: Sit to/from Stand Sit to Stand: Max assist, +2 physical assistance, +2 safety/equipment, From elevated surface           General transfer comment: Max A +2 HHA to stand from bed slightly elevate. Pt unable to  maintain standing balance without Max A.      Balance Overall balance assessment: Needs assistance Sitting-balance support: Bilateral upper extremity supported, Feet supported Sitting balance-Leahy Scale: Poor Sitting balance - Comments: Dependent on up to Mod A posterior support to maintain sitting balance. Postural control: Posterior lean Standing balance support: Bilateral upper extremity supported, During functional activity Standing balance-Leahy Scale: Zero Standing balance comment: Dependent on Max external support                           ADL either performed or assessed with clinical judgement   ADL Overall ADL's : Needs assistance/impaired Eating/Feeding: NPO   Grooming: Maximal assistance   Upper Body Bathing: Maximal assistance   Lower Body Bathing: Maximal assistance   Upper Body Dressing : Maximal assistance   Lower Body Dressing: Maximal assistance   Toilet Transfer: Maximal assistance;+2 for physical assistance;+2 for safety/equipment;BSC/3in1   Toileting- Clothing Manipulation and Hygiene: Total assistance  Vision   Vision Assessment?: No apparent visual deficits     Perception         Praxis          Pertinent Vitals/Pain Pain Assessment Pain Assessment: Faces Faces Pain Scale: Hurts even more Pain Location: back Pain Descriptors / Indicators: Grimacing, Guarding Pain Intervention(s): Monitored during session, Limited activity within patient's tolerance, Repositioned     Extremity/Trunk Assessment Upper Extremity Assessment Upper Extremity Assessment: Generalized weakness   Lower Extremity Assessment Lower Extremity Assessment: Defer to PT evaluation       Communication Communication Communication: Impaired Factors Affecting Communication: Difficulty expressing self;Reduced clarity of speech   Cognition Arousal: Alert Behavior During Therapy: Flat affect Cognition: Difficult to assess, Cognition impaired         Attention impairment (select first level of impairment): Sustained attention Executive functioning impairment (select all impairments): Initiation, Organization, Sequencing OT - Cognition Comments: Difficult to assess cognition due to reduced clarity of speech and difficulty expressing self. Diffuclty initiating movements and organizing tasks.                 Following commands: Impaired Following commands impaired: Follows one step commands inconsistently, Follows one step commands with increased time     Cueing  General Comments   Cueing Techniques: Verbal cues;Visual cues;Tactile cues;Gestural cues  Afib noted on monitor throughout session.   Exercises     Shoulder Instructions      Home Living Family/patient expects to be discharged to:: Private residence Living Arrangements: Alone Available Help at Discharge: Family (grandson lives nearby) Type of Home: House Home Access: Stairs to enter Secretary/administrator of Steps: 3 Entrance Stairs-Rails: Right;Left;Can reach both Home Layout: Two level;Able to live on main level with bedroom/bathroom     Bathroom Shower/Tub: Walk-in shower         Home Equipment: Cane - single  Librarian, Academic (2 wheels);Lift chair;Grab bars - toilet;Grab bars - tub/shower   Additional Comments: Information gathered per chart review, Pt with reduced clarity of speech and cognition. No family present to verify.      Prior Functioning/Environment Prior Level of Function : Independent/Modified Independent             Mobility Comments: Independent per chart review ADLs Comments: Independent per chart review    OT Problem List: Decreased strength;Decreased range of motion;Decreased activity tolerance;Impaired balance (sitting and/or standing);Decreased coordination;Decreased cognition;Decreased knowledge of use of DME or AE;Pain   OT Treatment/Interventions: Self-care/ADL training;Therapeutic exercise;Energy conservation;DME and/or AE instruction;Therapeutic activities;Patient/family education;Balance training      OT Goals(Current goals can be found in the care plan section)   Acute Rehab OT Goals Patient Stated Goal: none stated this date OT Goal Formulation: Patient unable to participate in goal setting Time For Goal Achievement: 07/22/24 Potential to Achieve Goals: Good ADL Goals Pt Will Perform Upper Body Dressing: with min assist;sitting Pt Will Perform Lower Body Dressing: with min assist;sitting/lateral leans Pt Will Transfer to Toilet: with min assist;stand pivot transfer;bedside commode Additional ADL Goal #1: Pt will engage in bed mobility with Min A as a precursor to engagement in ADL tasks OOB Additional ADL Goal #2: Pt will tolerate 5 minute ADL task sitting EOB with no more than CGA.   OT Frequency:  Min 2X/week    Co-evaluation PT/OT/SLP Co-Evaluation/Treatment: Yes Reason for Co-Treatment: Complexity of the patient's impairments (multi-system involvement);For patient/therapist safety;To address functional/ADL transfers   OT goals addressed during session: ADL's and self-care;Strengthening/ROM      AM-PAC OT 6  Clicks Daily Activity      Outcome Measure Help from another person eating meals?: Total Help from another person taking care of personal grooming?: A Lot Help from another person toileting, which includes using toliet, bedpan, or urinal?: Total Help from another person bathing (including washing, rinsing, drying)?: A Lot Help from another person to put on and taking off regular upper body clothing?: A Lot Help from another person to put on and taking off regular lower body clothing?: A Lot 6 Click Score: 10   End of Session    Activity Tolerance: Patient tolerated treatment well Patient left: in bed;with call bell/phone within reach;with bed alarm set  OT Visit Diagnosis: Unsteadiness on feet (R26.81);Muscle weakness (generalized) (M62.81);Pain;History of falling (Z91.81)                Time: 9086-9066 OT Time Calculation (min): 20 min Charges:  OT General Charges $OT Visit: 1 Visit OT Evaluation $OT Eval Low Complexity: 1 Low  Maurilio CROME, OTR/L.  [facility] Acute Rehabilitation  Office: [phone]   Maurilio PARAS Minola Guin 07/08/2024, 12:11 PM

## 2024-07-08 NOTE — Evaluation (Signed)
 Physical Therapy Evaluation Patient Details Name: RYSZARD SOCARRAS MRN: [id number] DOB: 1946/02/06 Today's Date: 07/08/2024  History of Present Illness  79 y.o. male presents 07/06/24 with AMS and weakness. Possible seizure activity en route to ED.  Found to have hypotension, AKI, elevated lactic acid, mild leukocytosis. No source for infection, concern for SIRS/sepsis versus seizure. EEG study results within normal limits.  PMH: HTN, afib, and alcohol  use.   Clinical Impression  Pt is currently mobilizing below his baseline due to weakness, fatigue, and altered cognition s/p seizure and new onset of AMS. Pt independent prior to admission but has had multiple recent falls per chart review. Pt requires maxAx2 to for bed mobility and mod-maxA for posterior support upon sitting EOB due to posterior lean. Pt able to briefly correct posterior lean with multimodal cues but is unable to maintain. STS attempted, pt able to achieve partial stance with maxAx2 but is unable to maintain the position for >5s or achieve full stance. Pt lethargic throughout session and having difficulty tolerating functional mobility at this time. Pt would benefit from continued PT services focused on bed mobility, transfers, strength, and balance to promote improved functional mobility.         If plan is discharge home, recommend the following: Two people to help with walking and/or transfers;Two people to help with bathing/dressing/bathroom;Assistance with cooking/housework;Direct supervision/assist for medications management;Help with stairs or ramp for entrance;Direct supervision/assist for financial management;Assist for transportation;Supervision due to cognitive status   Can travel by private vehicle   No    Equipment Recommendations Wheelchair (measurements PT);Wheelchair cushion (measurements PT)  Recommendations for Other Services       Functional Status Assessment Patient has had a recent decline in their  functional status and demonstrates the ability to make significant improvements in function in a reasonable and predictable amount of time.     Precautions / Restrictions Precautions Precautions: Fall Recall of Precautions/Restrictions: Impaired Precaution/Restrictions Comments: Seizure Restrictions Weight Bearing Restrictions Per Provider Order: No      Mobility  Bed Mobility Overal bed mobility: Needs Assistance Bed Mobility: Supine to Sit, Sit to Supine     Supine to sit: Max assist, +2 for physical assistance, +2 for safety/equipment Sit to supine: Max assist, +2 for physical assistance, +2 for safety/equipment   General bed mobility comments: Pt initially doing well mobilizing B LE in bed, however, was unable to follow cues to bring B LE towards EOB. Unable to reposition self EOB due to posterior lean. Posterior support required while sitting EOB, follows cues to bring trunk forward but was unable to maintain.    Transfers Overall transfer level: Needs assistance Equipment used: 2 person hand held assist Transfers: Sit to/from Stand Sit to Stand: Max assist, +2 physical assistance, +2 safety/equipment, From elevated surface           General transfer comment: Max A +2 HHA to stand from bed slightly elevate. Pt unable to  maintain standing balance without Max A, partial stance completed. Tolerated partial stance ~5 seconds.    Ambulation/Gait                  Stairs            Wheelchair Mobility     Tilt Bed    Modified Rankin (Stroke Patients Only)       Balance Overall balance assessment: Needs assistance Sitting-balance support: Bilateral upper extremity supported, Feet supported Sitting balance-Leahy Scale: Poor Sitting balance - Comments: Dependent on up to Mod  A posterior support to maintain sitting balance towards beginning and end of sitting trial. Temporarily able to maintain upright posture without support ~5-10s with heavy  cues. Postural control: Posterior lean Standing balance support: Bilateral upper extremity supported, During functional activity Standing balance-Leahy Scale: Zero Standing balance comment: Dependent on Max external support                             Pertinent Vitals/Pain Pain Assessment Pain Assessment: Faces Faces Pain Scale: Hurts even more Pain Location: Back Pain Descriptors / Indicators: Grimacing, Guarding Pain Intervention(s): Limited activity within patient's tolerance, Repositioned, Monitored during session    Home Living Family/patient expects to be discharged to:: Private residence Living Arrangements: Alone Available Help at Discharge: Family (grandson lives nearby) Type of Home: House Home Access: Stairs to enter Entrance Stairs-Rails: Right;Left;Can reach both Secretary/administrator of Steps: 3   Home Layout: Two level;Able to live on main level with bedroom/bathroom Home Equipment: Cane - single point;Rolling Walker (2 wheels);Lift chair;Grab bars - toilet;Grab bars - tub/shower Additional Comments: Information gathered per chart review, Pt with reduced clarity of speech and cognition. No family present to verify.    Prior Function Prior Level of Function : Independent/Modified Independent             Mobility Comments: Independent per chart review ADLs Comments: Independent per chart review     Extremity/Trunk Assessment   Upper Extremity Assessment Upper Extremity Assessment: Defer to OT evaluation    Lower Extremity Assessment Lower Extremity Assessment: Generalized weakness (Pt able to demonstrate AROM hip flexion, knee extension, and ankle dorsiflexion against gravity within partial range while sitting EOB. No resistance applied due to pt being unable to complete full range.)    Cervical / Trunk Assessment Cervical / Trunk Assessment: Kyphotic  Communication   Communication Communication: Impaired Factors Affecting Communication:  Difficulty expressing self;Reduced clarity of speech    Cognition Arousal: Alert Behavior During Therapy: Flat affect   PT - Cognitive impairments: Difficult to assess, Awareness, Safety/Judgement, Problem solving, Sequencing, Initiation, Attention, Orientation Difficult to assess due to: Level of arousal, Impaired communication Orientation impairments: Person                     Following commands: Impaired Following commands impaired: Follows one step commands inconsistently, Follows one step commands with increased time     Cueing Cueing Techniques: Verbal cues, Visual cues, Tactile cues, Gestural cues     General Comments General comments (skin integrity, edema, etc.): VSS throughout. Bruising noted along L side of back and shoulder blade from recent falls.    Exercises     Assessment/Plan    PT Assessment Patient needs continued PT services  PT Problem List Decreased strength;Decreased mobility;Decreased safety awareness;Decreased range of motion;Decreased coordination;Decreased knowledge of precautions;Decreased activity tolerance;Decreased cognition;Decreased balance;Decreased skin integrity;Decreased knowledge of use of DME;Pain       PT Treatment Interventions DME instruction;Therapeutic exercise;Gait training;Balance training;Stair training;Neuromuscular re-education;Functional mobility training;Therapeutic activities;Patient/family education;Wheelchair mobility training    PT Goals (Current goals can be found in the Care Plan section)  Acute Rehab PT Goals Patient Stated Goal: None stated    Frequency Min 2X/week     Co-evaluation PT/OT/SLP Co-Evaluation/Treatment: Yes Reason for Co-Treatment: Complexity of the patient's impairments (multi-system involvement);For patient/therapist safety;To address functional/ADL transfers PT goals addressed during session: Mobility/safety with mobility;Balance OT goals addressed during session: ADL's and  self-care;Strengthening/ROM       AM-PAC PT  6 Clicks Mobility  Outcome Measure Help needed turning from your back to your side while in a flat bed without using bedrails?: A Lot Help needed moving from lying on your back to sitting on the side of a flat bed without using bedrails?: A Lot Help needed moving to and from a bed to a chair (including a wheelchair)?: Total Help needed standing up from a chair using your arms (e.g., wheelchair or bedside chair)?: A Lot Help needed to walk in hospital room?: Total Help needed climbing 3-5 steps with a railing? : Total 6 Click Score: 9    End of Session   Activity Tolerance: Patient limited by fatigue;Patient limited by lethargy Patient left: in bed;with call bell/phone within reach;with bed alarm set Nurse Communication: Mobility status PT Visit Diagnosis: Unsteadiness on feet (R26.81);Muscle weakness (generalized) (M62.81)    Time: 9086-9066 PT Time Calculation (min) (ACUTE ONLY): 20 min   Charges:   PT Evaluation $PT Eval High Complexity: 1 High   PT General Charges $$ ACUTE PT VISIT: 1 Visit         Sabra Morel, PT, DPT  Acute Rehabilitation Services         Office: [phone]     Sabra MARLA Morel 07/08/2024, 1:05 PM

## 2024-07-08 NOTE — Plan of Care (Signed)
   Problem: Clinical Measurements: Goal: Ability to maintain clinical measurements within normal limits will improve Outcome: Progressing Goal: Will remain free from infection Outcome: Progressing Goal: Diagnostic test results will improve Outcome: Progressing Goal: Respiratory complications will improve Outcome: Progressing

## 2024-07-08 NOTE — Progress Notes (Signed)
 SLP Cancellation Note  Patient Details Name: Gabriel Everett MRN: [id number] DOB: 07/08/1945   Cancelled treatment:        Attempted to see pt for ongoing dysphagia management.  Pt off floor at time of attempt. Spoke with nursing who reports pt remains somnolent/lethargic from sedative medications overnight.  Will reattempt as schedule permits.    Anette FORBES Grippe, MA, CCC-SLP Acute Rehabilitation Services Office: [phone] 07/08/2024, 11:13 AM

## 2024-07-09 ENCOUNTER — Encounter (HOSPITAL_COMMUNITY): Payer: Self-pay | Admitting: Internal Medicine

## 2024-07-09 DIAGNOSIS — G9340 Encephalopathy, unspecified: Secondary | ICD-10-CM | POA: Diagnosis not present

## 2024-07-09 DIAGNOSIS — F10931 Alcohol use, unspecified with withdrawal delirium: Secondary | ICD-10-CM | POA: Diagnosis not present

## 2024-07-09 LAB — COMPREHENSIVE METABOLIC PANEL WITH GFR
ALT: 45 U/L — ABNORMAL HIGH (ref 0–44)
AST: 64 U/L — ABNORMAL HIGH (ref 15–41)
Albumin: 2.7 g/dL — ABNORMAL LOW (ref 3.5–5.0)
Alkaline Phosphatase: 54 U/L (ref 38–126)
Anion gap: 7 (ref 5–15)
BUN: 17 mg/dL (ref 8–23)
CO2: 28 mmol/L (ref 22–32)
Calcium: 7.4 mg/dL — ABNORMAL LOW (ref 8.9–10.3)
Chloride: 102 mmol/L (ref 98–111)
Creatinine, Ser: 0.96 mg/dL (ref 0.61–1.24)
GFR, Estimated: >60 mL/min
Glucose, Bld: 127 mg/dL — ABNORMAL HIGH (ref 70–99)
Potassium: 4 mmol/L (ref 3.5–5.1)
Sodium: 136 mmol/L (ref 135–145)
Total Bilirubin: 1.3 mg/dL — ABNORMAL HIGH (ref 0.0–1.2)
Total Protein: 4.5 g/dL — ABNORMAL LOW (ref 6.5–8.1)

## 2024-07-09 LAB — CBC WITH DIFFERENTIAL/PLATELET
Abs Immature Granulocytes: 0.03 K/uL (ref 0.00–0.07)
Basophils Absolute: 0 K/uL (ref 0.0–0.1)
Basophils Relative: 0 %
Eosinophils Absolute: 0 K/uL (ref 0.0–0.5)
Eosinophils Relative: 1 %
HCT: 27.2 % — ABNORMAL LOW (ref 39.0–52.0)
Hemoglobin: 9.9 g/dL — ABNORMAL LOW (ref 13.0–17.0)
Immature Granulocytes: 1 %
Lymphocytes Relative: 17 %
Lymphs Abs: 0.7 K/uL (ref 0.7–4.0)
MCH: 38.8 pg — ABNORMAL HIGH (ref 26.0–34.0)
MCHC: 36.4 g/dL — ABNORMAL HIGH (ref 30.0–36.0)
MCV: 106.7 fL — ABNORMAL HIGH (ref 80.0–100.0)
Monocytes Absolute: 0.3 K/uL (ref 0.1–1.0)
Monocytes Relative: 7 %
Neutro Abs: 2.9 K/uL (ref 1.7–7.7)
Neutrophils Relative %: 74 %
Platelets: 59 K/uL — ABNORMAL LOW (ref 150–400)
RBC: 2.55 MIL/uL — ABNORMAL LOW (ref 4.22–5.81)
RDW: 13.3 % (ref 11.5–15.5)
Smear Review: NORMAL
WBC: 3.9 K/uL — ABNORMAL LOW (ref 4.0–10.5)
nRBC: 0 % (ref 0.0–0.2)

## 2024-07-09 LAB — PHOSPHORUS: Phosphorus: <1 mg/dL — CL (ref 2.5–4.6)

## 2024-07-09 LAB — MAGNESIUM: Magnesium: 1.7 mg/dL (ref 1.7–2.4)

## 2024-07-09 LAB — CK: Total CK: 162 U/L (ref 49–397)

## 2024-07-09 MED ORDER — K PHOS MONO-SOD PHOS DI & MONO 155-852-130 MG PO TABS
500.0000 mg | ORAL_TABLET | Freq: Four times a day (QID) | ORAL | Status: AC
  Administered 2024-07-09 (×2): 500 mg via ORAL
  Filled 2024-07-09 (×2): qty 2

## 2024-07-09 MED ORDER — K PHOS MONO-SOD PHOS DI & MONO 155-852-130 MG PO TABS
500.0000 mg | ORAL_TABLET | Freq: Two times a day (BID) | ORAL | Status: DC
  Administered 2024-07-09: 500 mg via ORAL
  Filled 2024-07-09: qty 2

## 2024-07-09 MED ORDER — K PHOS MONO-SOD PHOS DI & MONO 155-852-130 MG PO TABS: 500.0000 mg | ORAL_TABLET | Freq: Four times a day (QID) | ORAL | Status: DC

## 2024-07-09 MED ORDER — SODIUM PHOSPHATES 45 MMOLE/15ML IV SOLN
30.0000 mmol | Freq: Once | INTRAVENOUS | Status: AC
  Administered 2024-07-09: 30 mmol via INTRAVENOUS
  Filled 2024-07-09: qty 10

## 2024-07-09 MED ORDER — THIAMINE HCL 100 MG/ML IJ SOLN
500.0000 mg | INTRAVENOUS | Status: DC
  Administered 2024-07-09 – 2024-07-14 (×6): 500 mg via INTRAVENOUS
  Filled 2024-07-09 (×2): qty 5
  Filled 2024-07-09 (×2): qty 500
  Filled 2024-07-09 (×6): qty 5

## 2024-07-09 MED ORDER — MAGNESIUM SULFATE 2 GM/50ML IV SOLN
2.0000 g | Freq: Once | INTRAVENOUS | Status: AC
  Administered 2024-07-09: 2 g via INTRAVENOUS
  Filled 2024-07-09: qty 50

## 2024-07-09 NOTE — Progress Notes (Signed)
 Pharmacy Electrolyte Replacement  Recent Labs:  Recent Labs    07/09/24 0334  K 4.0  MG 1.7  PHOS <1.0*  CREATININE 0.96    Low Critical Values (K </= 2.5, Phos </= 1, Mg </= 1) Present: Phos = <1  Dr Franky asked pharmacy to replete phosphorus.  Plan: Sodium phos 30 mmol IV x1 plus NeutraPhos 500mg  PO BID x2   Marvetta Dauphin, PharmD, BCPS 07/09/2024 5:22 AM

## 2024-07-09 NOTE — TOC Initial Note (Signed)
 Transition of Care [facility]) - Initial/Assessment Note    Patient Details  Name: Gabriel Everett MRN: [id number] Date of Birth: [birth year]  Transition of Care [facility]) CM/SW Contact:    Gabriel GORMAN Kindle, LCSW Phone Number: 07/09/2024, 11:55 AM  Clinical Narrative:                 CSW received consult for possible SNF placement at time of discharge. CSW spoke with patient's grandson as patient still displaying confusion and requesting to go home. 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 He sometimes uses a cane. Grandson works in [place] but lives in [place] and is not able to stay with patient. Patient's grandson expressed understanding of PT recommendation and is agreeable to SNF placement at time of discharge. He will visit patient tonight and discuss SNF with him. CSW discussed insurance authorization process and will provide Medicare SNF ratings list. CSW will send out referrals for review and provide bed offers as available. SDOH screen reviewed and addressed as needed.    Skilled Nursing Rehab Facilities-   [email or web address]   Ratings out of 5 stars (5 the highest)  Name Address  Phone # Quality Care Staffing Health Inspection Overall  [facility] [address] [phone] 2 1 2 1   [facility] [address], [place] [phone] 5 2 4 5   [facility] [address], [facility] [phone] 2 1 1 1   [facility] [address] [place] [phone] 4 3 4 4   [facility]  [address], [place] [phone] 4 3 5 5   [facility] [address] [facility] [phone] 5 3 2 3   [facility] [address], [place] [phone] 5 1 2 2   [facility] [address], [place] [phone] 3 4 4 4   [address] ([facility]) [address] [place] [phone] 1 3 3 2   [facility] [address] [place],  [place] [phone] 4 2 2 2   [facility] ([place]) [address] [place], [place] [phone] 2 1 1 1   Clotilda Pereyra [address] [place] [phone] 2 4 4 4   [facility] [address], [place] [phone] 3 1 3 2   [facility] ([facility]) [address], [place] [phone] 1 2 4 3           [facility] [address] [place] [phone] 3 1 5 4   [facility] [address] [place] [phone] 4 2 1 1   [facility]  [address] [place] [phone] 2 4 1 1   [facility] [address] [phone] 2 2 5 5   [facility] [address] [place] [address], [place] [phone] 2 2 3 3           [facility] [address] [place] [phone] 2 1 2 1   [facility] (No UHC) [address], [place] [phone] 4 3 4 4   [facility] [address] [facility] [phone] 3  5 5   [facility] [address] [place] [phone] 4 2 1 1   [facility] Milian [address] [place] [phone] 3 1 2 1   [facility] [address], [place] [phone] 1 3 3 2   [facility] [address] [place] [phone] 2 2 3 3   [facility] [address] [place], [place] [phone] 2 1 4 3   [facility] [address], [place] [phone] 2  1 1 1   [facility] [address], [place] [phone] 2 1 2 1   [facility] [address] [place] [phone] 3 1 2 1           [facility] [address], [place] [phone] 4 1 3 2   [facility] [address] [place]  [phone] 2 4 4 4   [facility] (No Humana) [address] [place], [place] [phone] 3 2 5 5   [facility]) [address], [place] [phone] 3 2 3 3   [facility] [place] [address] [place] [phone] 5 3 5 5   [facility] [address] [place], [place] [phone] 2 1 1 1    [facility] [address] [place], [place] [phone] 3 5 5 5           [facility] [address] [place], [place] [phone] 5 4 5 5   [facility])  [address] [place] [phone] 1 1 2 1   [facility] [address] [place] [phone]  2 4 4   [facility] [address], [place] [phone] 3 5 5 5   [address] [place], [place] [phone] 4 2 2 2   [facility]) [address] [place] [phone] 1 1 3 1   [facility] [address] [place] [phone] 2 2 2 2      Expected Discharge Plan: Skilled Nursing Facility Barriers to Discharge: Continued Medical Work up, English As A Second Language Teacher, SNF Pending bed offer   Patient Goals and CMS Choice Patient states their goals for this hospitalization and ongoing recovery are:: Rehab CMS Medicare.gov Compare Post Acute Care list provided to:: Patient Represenative (must comment) Choice offered to / list presented to :  [name] [place] ownership interest in [facility].provided to::  [name]    Expected Discharge Plan and Services In-house Referral: Clinical Social Work   Post Acute Care Choice: Skilled Nursing Facility Living arrangements for the past 2 months: Single Family Home                                      Prior Living Arrangements/Services Living arrangements for the past 2 months: Single Family Home Lives with:: Self Patient language and need for interpreter reviewed:: Yes Do you feel safe going back to the place where you live?: Yes      Need for Family Participation in Patient Care: Yes (Comment) Care giver support system in place?: No (comment) (lives alone) Current home services: DME [facility]) Criminal Activity/Legal Involvement Pertinent to Current Situation/Hospitalization: No - Comment as needed  Activities of Daily Living   ADL Screening (condition at time of  admission) Independently performs ADLs?: No Does the patient have a NEW difficulty with bathing/dressing/toileting/self-feeding that is expected to last >3 days?: Yes (Initiates electronic notice to provider for possible OT consult) Does the patient have a NEW difficulty with getting in/out of bed, walking, or climbing stairs that is expected to last >3 days?: Yes (Initiates electronic notice to provider for possible PT consult) Does the patient have a NEW difficulty with communication that is expected to last >3 days?: Yes (Initiates electronic notice to provider for possible SLP consult) Is the patient deaf or have difficulty hearing?: No Does the patient have difficulty seeing, even when wearing glasses/contacts?: No Does the patient have difficulty concentrating, remembering, or making decisions?:  No  Permission Sought/Granted Permission sought to share information with : Facility Medical Sales Representative, Family Supports Permission granted to share information with : Yes, Verbal Permission Granted  Share Information with NAME: Danel, Requena   [phone]  Permission granted to share info w AGENCY: SNFs        Emotional Assessment Appearance:: Appears stated age Attitude/Demeanor/Rapport: Unable to Assess Affect (typically observed): Unable to Assess Orientation: : Oriented to Self, Oriented to Place, Oriented to Situation Alcohol  / Substance Use: Not Applicable Psych Involvement: No (comment)  Admission diagnosis:  Acute encephalopathy [G93.40] Sepsis, due to unspecified organism, unspecified whether acute organ dysfunction present [facility]) [A41.9] Patient Active Problem List   Diagnosis Date Noted   Seizure-like activity (HCC) 07/06/2024   Acute encephalopathy 07/06/2024   AKI (acute kidney injury) 07/06/2024   High anion gap metabolic acidosis 07/06/2024   PAF (paroxysmal atrial fibrillation) (HCC) 11/24/2023   SVT (supraventricular tachycardia) 11/23/2023   SIRS  (systemic inflammatory response syndrome) (HCC) 11/23/2023   Frequent falls 11/23/2023   Hypokalemia 11/23/2023   Hypomagnesemia 11/23/2023   Alcohol  use 11/23/2023   Essential hypertension 07/19/2013   Atrial flutter (HCC) 05/22/2013   Allergy    PCP:  [name], FNP Pharmacy:   [id number] - [place], [address] - [address] AT [facility] [address] [place] [address] Phone: [phone] Fax: [phone]  [facility] [address], [place] - [address] [place] [address] Phone: [phone] Fax: [phone]     Social Drivers of Health (SDOH) Social History: SDOH Screenings   Food Insecurity: No Food Insecurity (07/07/2024)  Housing: Low Risk (07/07/2024)  Transportation Needs: No Transportation Needs (07/07/2024)  Utilities: Not At Risk (07/07/2024)  Social Connections: Socially Isolated (07/07/2024)  Tobacco Use: Low Risk (07/09/2024)   SDOH Interventions: Social Connections Interventions: [facility] Provided, Inpatient TOC   Readmission Risk Interventions     No data to display

## 2024-07-09 NOTE — Care Management Important Message (Signed)
 Important Message  Patient Details  Name: Gabriel Everett MRN: [id number] Date of Birth: 04-18-1946   Important Message Given:  Yes - Medicare IM     [name] 07/09/2024, 4:01 PM

## 2024-07-09 NOTE — Progress Notes (Signed)
 Speech Language Pathology Treatment: Dysphagia  Patient Details Name: Gabriel Everett MRN: [id number] DOB: 1946/02/20 Today's Date: 07/09/2024 Time: 9142-9089 SLP Time Calculation (min) (ACUTE ONLY): 13 min  Assessment / Plan / Recommendation Clinical Impression  Pt seen for swallowing reassessment. Pt is awake and alert today, seemingly confused.  Eager to go home.  Today pt required 6+ swallows with thin liquid.  There was wet vocal quality and immediate throat clear following trial.  Pt with prolonged oral phase with all consistencies today v oral holding.  Pt with 1-2 swallows observed with nectar/mildly thick liquid and clear vocal quality.  With puree there was 1-2 swallows and no oral residue.  Pt refused solid texture trials.  Pt would benefit from MBS, but does not appear ready at this time as he is not accepting solids.  Recommend initiation of MBS with SLP to follow for advancement v possible instrumental study.  Recommend puree diet with nectar/mildly thick liquids.     HPI HPI: Gabriel Everett is a 79 y.o. male who presented to ED 1/18 by EMS with altered mental status and weakness. Seizure activeity en route to ED lasting 1 min. Patient was alone and at baseline is able to be independent.  His grandson does come to check on him every now and then.  Found him on the floor yesterday.  Tried to care for him overnight but patient was getting up every hour and remained confused and had several more falls.  EMS was called this morning. Also reported he was diagnosed/treated for the flu about a week ago. MRI 1/18 with no acute findings.  Chest CT 1/18 with no evidence of infection. No prior ST eval/treat in EMR. Pt with with medical history significant of hypertension, atrial fibrillation, alcohol  use.      SLP Plan  Continue with current plan of care         Recommendations  Diet recommendations: Dysphagia 1 (puree);Nectar-thick liquid Liquids provided via: Cup;Straw Medication  Administration: Crushed with puree Supervision: Intermittent supervision to cue for compensatory strategies Compensations: Slow rate;Small sips/bites Postural Changes and/or Swallow Maneuvers: Seated upright 90 degrees                  Oral care BID     Dysphagia, unspecified (R13.10)     Continue with current plan of care     Anette FORBES Grippe, MA, CCC-SLP Acute Rehabilitation Services Office: [phone] 07/09/2024, 9:19 AM

## 2024-07-09 NOTE — Plan of Care (Signed)
" °  Problem: Fluid Volume: Goal: Hemodynamic stability will improve Outcome: Progressing   Problem: Clinical Measurements: Goal: Diagnostic test results will improve Outcome: Progressing Goal: Signs and symptoms of infection will decrease Outcome: Progressing   Problem: Respiratory: Goal: Ability to maintain adequate ventilation will improve Outcome: Progressing   Problem: Safety: Goal: Non-violent Restraint(s) Outcome: Progressing   Problem: Education: Goal: Expressions of having a comfortable level of knowledge regarding the disease process will increase Outcome: Progressing   Problem: Coping: Goal: Ability to adjust to condition or change in health will improve Outcome: Progressing Goal: Ability to identify appropriate support needs will improve Outcome: Progressing   Problem: Health Behavior/Discharge Planning: Goal: Compliance with prescribed medication regimen will improve Outcome: Progressing   Problem: Medication: Goal: Risk for medication side effects will decrease Outcome: Progressing   Problem: Clinical Measurements: Goal: Complications related to the disease process, condition or treatment will be avoided or minimized Outcome: Progressing Goal: Diagnostic test results will improve Outcome: Progressing   Problem: Safety: Goal: Verbalization of understanding the information provided will improve Outcome: Progressing   Problem: Self-Concept: Goal: Level of anxiety will decrease Outcome: Progressing Goal: Ability to verbalize feelings about condition will improve Outcome: Progressing   Problem: Education: Goal: Knowledge of General Education information will improve Description: Including pain rating scale, medication(s)/side effects and non-pharmacologic comfort measures Outcome: Progressing   Problem: Health Behavior/Discharge Planning: Goal: Ability to manage health-related needs will improve Outcome: Progressing   Problem: Clinical  Measurements: Goal: Ability to maintain clinical measurements within normal limits will improve Outcome: Progressing Goal: Will remain free from infection Outcome: Progressing Goal: Diagnostic test results will improve Outcome: Progressing Goal: Respiratory complications will improve Outcome: Progressing Goal: Cardiovascular complication will be avoided Outcome: Progressing   Problem: Activity: Goal: Risk for activity intolerance will decrease Outcome: Progressing   Problem: Nutrition: Goal: Adequate nutrition will be maintained Outcome: Progressing   Problem: Coping: Goal: Level of anxiety will decrease Outcome: Progressing   Problem: Elimination: Goal: Will not experience complications related to bowel motility Outcome: Progressing Goal: Will not experience complications related to urinary retention Outcome: Progressing   Problem: Pain Managment: Goal: General experience of comfort will improve and/or be controlled Outcome: Progressing   Problem: Safety: Goal: Ability to remain free from injury will improve Outcome: Progressing   Problem: Skin Integrity: Goal: Risk for impaired skin integrity will decrease Outcome: Progressing   "

## 2024-07-09 NOTE — [place] FL2 (Signed)
 " [place]  MEDICAID FL2 LEVEL OF CARE FORM     IDENTIFICATION  Patient Name: Gabriel Everett Birthdate: March 18, 1946 Sex: male Admission Date (Current Location): 07/06/2024  [facility] and [place] Number:  Producer, Television/film/video and Address:  [facility], [address] [place], [place] [address]      Provider Number: [id number]  Attending Physician Name and Address:  Elgergawy, Brayton RAMAN, MD  Relative Name and Phone Number:       Current Level of Care: Hospital Recommended Level of Care: Skilled Nursing Facility Prior Approval Number:    Date Approved/Denied:   PASRR Number: [id number]  Discharge Plan: SNF    Current Diagnoses: Patient Active Problem List   Diagnosis Date Noted   Seizure-like activity (HCC) 07/06/2024   Acute encephalopathy 07/06/2024   AKI (acute kidney injury) 07/06/2024   High anion gap metabolic acidosis 07/06/2024   PAF (paroxysmal atrial fibrillation) (HCC) 11/24/2023   SVT (supraventricular tachycardia) 11/23/2023   SIRS (systemic inflammatory response syndrome) (HCC) 11/23/2023   Frequent falls 11/23/2023   Hypokalemia 11/23/2023   Hypomagnesemia 11/23/2023   Alcohol  use 11/23/2023   Essential hypertension 07/19/2013   Atrial flutter (HCC) 05/22/2013   Allergy     Orientation RESPIRATION BLADDER Height & Weight     Self, Situation, Place  Normal Incontinent, External catheter Weight: 155 lb (70.3 kg) Height:  5' 10 (177.8 cm)  BEHAVIORAL SYMPTOMS/MOOD NEUROLOGICAL BOWEL NUTRITION STATUS      Continent Diet (See dc summary)  AMBULATORY STATUS COMMUNICATION OF NEEDS Skin   Extensive Assist Verbally Other (Comment) (wound on arm; deep tissue injury on buttocks;)                       Personal Care Assistance Level of Assistance  Bathing, Feeding, Dressing Bathing Assistance: Maximum assistance Feeding assistance: Limited assistance Dressing Assistance: Limited assistance     Functional Limitations Info   Sight Sight Info: Impaired (glasses)        SPECIAL CARE FACTORS FREQUENCY  PT (By licensed PT), OT (By licensed OT)     PT Frequency: 5x/week OT Frequency: 5x/week            Contractures Contractures Info: Not present    Additional Factors Info  Code Status, Allergies Code Status Info: Full Allergies Info: Cardizem            Current Medications (07/09/2024):  This is the current hospital active medication list Current Facility-Administered Medications  Medication Dose Route Frequency Provider Last Rate Last Admin   acetaminophen  (TYLENOL ) tablet 650 mg  650 mg Oral Q6H PRN Seena Marsa NOVAK, MD       Or   acetaminophen  (TYLENOL ) suppository 650 mg  650 mg Rectal Q6H PRN Seena Marsa NOVAK, MD       acyclovir  ointment (ZOVIRAX ) 5 % 1 Application  1 Application Topical Q3H Singh, Prashant K, MD   1 Application at 07/09/24 [phone]   cefTRIAXone  (ROCEPHIN ) 1 g in sodium chloride  0.9 % 100 mL IVPB  1 g Intravenous Q24H Singh, Prashant K, MD 200 mL/hr at 07/08/24 1036 1 g at 07/08/24 1036   cyanocobalamin  (VITAMIN B12) injection 1,000 mcg  1,000 mcg Subcutaneous Daily Singh, Prashant K, MD   1,000 mcg at 07/09/24 0852   [START ON 07/11/2024] cyanocobalamin  (VITAMIN B12) tablet 1,000 mcg  1,000 mcg Oral Daily Singh, Prashant K, MD       folic acid  (FOLVITE ) tablet 1 mg  1 mg  Oral Daily Melvin, Alexander B, MD   1 mg at 07/09/24 9149   ipratropium-albuterol  (DUONEB) 0.5-2.5 (3) MG/3ML nebulizer solution 3 mL  3 mL Nebulization Q6H PRN Regalado, Belkys A, MD       metoprolol  tartrate (LOPRESSOR ) injection 5 mg  5 mg Intravenous Q8H PRN Singh, Prashant K, MD       multivitamin with minerals tablet 1 tablet  1 tablet Oral Daily Melvin, Alexander B, MD   1 tablet at 07/09/24 0850   pantoprazole  (PROTONIX ) injection 40 mg  40 mg Intravenous Q12H Regalado, Belkys A, MD   40 mg at 07/09/24 0851   phosphorus (K PHOS  NEUTRAL) tablet 500 mg  500 mg Oral BID Mattie Marvetta SQUIBB, RPH   500 mg at  07/09/24 0848   polyethylene glycol (MIRALAX  / GLYCOLAX ) packet 17 g  17 g Oral Daily PRN Melvin, Alexander B, MD       propranolol  (INDERAL ) tablet 20 mg  20 mg Oral BID Singh, Prashant K, MD   20 mg at 07/09/24 0849   sodium phosphate  30 mmol in sodium chloride  0.9 % 250 mL infusion  30 mmol Intravenous Once Mattie Marvetta SQUIBB, RPH 43 mL/hr at 07/09/24 1101 30 mmol at 07/09/24 1101   thiamine  (VITAMIN B1) 500 mg in sodium chloride  0.9 % 50 mL IVPB  500 mg Intravenous Daily Regalado, Belkys A, MD 110 mL/hr at 07/09/24 1158 500 mg at 07/09/24 1158   thiamine  (VITAMIN B1) tablet 100 mg  100 mg Oral Daily Melvin, Alexander B, MD   100 mg at 07/09/24 9149     Discharge Medications: Please see discharge summary for a list of discharge medications.  Relevant Imaging Results:  Relevant Lab Results:   Additional Information SSN: [id number]  Inocente GORMAN Kindle, LCSW     "

## 2024-07-09 NOTE — Progress Notes (Signed)
 " PROGRESS NOTE    Gabriel Everett  FMW:[id number] DOB: 1946/03/10 DOA: 07/06/2024 PCP: [name], FNP   Brief Narrative:  79 year old with past medical history significant for hypertension, A-fib, alcohol  use presented with altered mental status and weakness.  Patient was found down on the floor by family.  EMS was contacted.  Patient was noted to be confused.  Patient was recently treated for the flu a week ago.  Patient was noted to have multiple skin tears.  On EMS evaluation initial blood pressure was in the 70s.  Improved with IV fluids.  Patient and route had seizure lasting 1 minute with tonic-clonic activity.  Evaluation in the ED presented with a creatinine of 2.7 baseline of 1, bilirubin 3.3, white blood cell 11, hemoglobin 11, platelets 108, INR 1.5, lactic acid 7 subsequently repeated 5.  Respiratory panel, flu COVID RSV negative.  Chest x-ray showed no acute abnormality.  CT head showed no acute abnormality.  CT of chest abdomen and pelvis showed no acute abnormality but did show some hepatic steatosis   Assessment & Plan:   Acute metabolic encephalopathy, down for several hours at home, severe dehydration, hypokalemia, hypomagnesemia, questionable Seizure-like activity versus myoclonic jerks. Was found down by family, patient although somnolent tells me today that he was down for several hours, MRI brain and EEG nonacute, he denies any headache, he appears severely dehydrated with severe electrolyte abnormalities, being aggressively hydrated, electrolytes replaced, PT OT and speech.   - PT/OT input appreciated, will need SNF placement  - Improving .  AKI Mild rhabdo -AKI on presentation, with elevated total CK, with evidence of urine dipstick for blood while no RBCs seen in hpf. - Resolved with IV fluids   Hypokalemia Hypomagnesemia Hypophosphatemia - Replaced, continue to monitor closely and replace as needed  SIRS Hypothermia Due to being exposed to elements and  down for several hours, supportive care, TSH stable, B12 borderline low which will be replaced, no signs of infectious focus sipped possible mild UTI.  Taper down antibiotics for possible mild UTI.  Coffee Ground emesis;  Read by family, IV PPI, monitor H&H, some fall in H&H is due to heme dilution and is getting aggressive hydration.  Monitor no reoccurrence of the same  AKI Metabolic acidosis elevated anion gap Due to dehydration, baseline creatinine around 1, hydrate, renal function improving, monitor bladder scans, check CK.  Hypertension - Hold propranolol  due to soft blood pressure  A-fib - Hold beta-blocker to avoid future hypotension -Was not on anticoagulation prior to admission  Alcohol  use ? Alcohol   withdrawal -Drinks 2 alcoholic mixed with , 2 shots of vodka daily - High dose  thiamine  for 3 days, no signs of DTs he tells me that he drinks some alcohol  but not too much, no tremors.  Monitor off of CIWA.  Thrombocytopenia could be related to infectious process versus alcohol .  Monitor  Mild asymptomatic transaminitis.  Will check viral panel respiratory and hepatitis, check right upper quadrant ultrasound.  Likely shock liver from hypotension and dehydration   Estimated body mass index is 22.24 kg/m as calculated from the following:   Height as of this encounter: 5' 10 (1.778 m).   Weight as of this encounter: 70.3 kg.   DVT prophylaxis: SCD, holding on pharmacologic anticoagulation due to thrombocytopenia Code Status: Full code   Family Communication: None at bedside AM, mailbox full Disposition Plan:  Status is: Inpatient Remains inpatient appropriate because: Management of acute encephalopathy AKI    Consultants:  None  Procedures:  none  Antimicrobials:    Subjective:  Denies any complaints today, but as discussed with staff remains frail with poor appetite   Objective: Vitals:   07/09/24 0403 07/09/24 0800 07/09/24 1200 07/09/24 1500  BP:  122/82 135/62 91/60 109/61  Pulse: 67 70 61   Resp: (!) 21 17 20    Temp: 97.6 F (36.4 C) 98.5 F (36.9 C) 98.6 F (37 C) 98.4 F (36.9 C)  TempSrc: Oral Oral Oral Oral  SpO2: 96% 96% 97%   Weight:      Height:        Intake/Output Summary (Last 24 hours) at 07/09/2024 1612 Last data filed at 07/09/2024 1351 Gross per 24 hour  Intake --  Output 2450 ml  Net -2450 ml   Filed Weights   07/06/24 1136  Weight: 70.3 kg    Examination:   Awake Alert, is oriented x 3 today, but he remains with some confusion extremely frail Good air entry RRR,No Gallops,Rubs or new Murmurs, No Parasternal Heave +ve B.Sounds, Abd Soft, No tenderness, No rebound - guarding or rigidity. No Cyanosis, Clubbing or edema, No new Rash or bruise      Data Review:   Patient Lines/Drains/Airways Status     Active Line/Drains/Airways     Name Placement date Placement time Site Days   Peripheral IV 07/07/24 18 G Anterior;Right Forearm 07/07/24  1122  Forearm  2   External Urinary Catheter 07/07/24  1307  --  2   Wound 07/07/24 1246 Other (Comment) Arm Lower;Posterior;Right 07/07/24  1246  Arm  2   Wound 07/07/24 1247 Hand Left;Posterior 07/07/24  1247  Hand  2   Wound 07/07/24 1250 Pressure Injury Buttocks Deep Tissue Pressure Injury - Purple or maroon localized area of discolored intact skin or blood-filled blister due to damage of underlying soft tissue from pressure and/or shear. 07/07/24  1250  Buttocks  2   Wound 07/07/24 1300 Traumatic Arm Left;Posterior;Upper 07/07/24  1300  Arm  2   Wound 07/07/24 1300 Traumatic Arm Left;Lower;Posterior;Proximal 07/07/24  1300  Arm  2             Inpatient Medications  Scheduled Meds:  acyclovir  ointment  1 Application Topical Q3H   cyanocobalamin   1,000 mcg Subcutaneous Daily   [START ON 07/11/2024] vitamin B-12  1,000 mcg Oral Daily   folic acid   1 mg Oral Daily   multivitamin with minerals  1 tablet Oral Daily   pantoprazole  (PROTONIX ) IV  40 mg  Intravenous Q12H   phosphorus  500 mg Oral BID   propranolol   20 mg Oral BID   thiamine   100 mg Oral Daily   Continuous Infusions:  cefTRIAXone  (ROCEPHIN )  IV 1 g (07/09/24 1329)   sodium PHOSPHATE  IVPB (in mmol) 30 mmol (07/09/24 1101)   PRN Meds:.acetaminophen  **OR** acetaminophen , ipratropium-albuterol , metoprolol  tartrate, polyethylene glycol  DVT Prophylaxis   Recent Labs  Lab 07/06/24 1210 07/06/24 1217 07/07/24 0349 07/08/24 0306 07/09/24 0334  WBC  --  11.0* 9.2 4.7 3.9*  HGB 12.2* 11.4* 10.6* 9.6* 9.9*  HCT 36.0* 34.7* 29.7* 26.5* 27.2*  PLT  --  108* 101* 64* 59*  MCV  --  118.0* 111.2* 106.0* 106.7*  MCH  --  38.8* 39.7* 38.4* 38.8*  MCHC  --  32.9 35.7 36.2* 36.4*  RDW  --  13.5 13.7 13.3 13.3  LYMPHSABS  --  0.7  --   --  0.7  MONOABS  --  0.6  --   --  0.3  EOSABS  --  0.0  --   --  0.0  BASOSABS  --  0.0  --   --  0.0    Recent Labs  Lab 07/06/24 1210 07/06/24 1217 07/06/24 1408 07/06/24 2236 07/07/24 0349 07/07/24 0404 07/07/24 1330 07/08/24 0306 07/08/24 0609 07/09/24 0334  NA 134* 135  --   --  137  --   --  138  --  136  K 4.3 4.5  --   --  4.3  --   --  2.8*  --  4.0  CL 102 94*  --   --  96*  --   --  99  --  102  CO2  --  10*  --   --  15*  --   --  26  --  28  ANIONGAP  --  32*  --   --  25*  --   --  13  --  7  GLUCOSE 82 84  --   --  111*  --   --  177*  --  127*  BUN 40* 37*  --   --  55*  --   --  38*  --  17  CREATININE 3.10* 2.74*  --   --  3.08*  --   --  1.67*  --  0.96  AST  --  92*  --   --  121*  --   --  155*  --  64*  ALT  --  46*  --   --  57*  --   --  68*  --  45*  ALKPHOS  --  63  --   --  51  --   --  52  --  54  BILITOT  --  3.3*  --   --  1.8*  --   --  1.0  --  1.3*  ALBUMIN  --  3.4*  --   --  3.1*  --   --  2.7*  --  2.7*  CRP  --   --   --   --   --   --   --   --  1.3*  --   PROCALCITON  --   --   --   --   --   --   --   --  1.29  --   LATICACIDVEN 7.3*  --  5.6* 3.1*  --  2.0*  --   --   --   --   INR  --   1.5*  --   --   --   --   --   --   --   --   TSH  --   --   --   --  5.340*  --   --   --   --   --   AMMONIA  --   --   --   --   --   --  37*  --   --   --   MG  --  1.7  --   --   --   --   --   --  1.3* 1.7  PHOS  --   --   --   --   --   --   --   --   --  <1.0*  CALCIUM  --  8.0*  --   --  7.5*  --   --  7.3*  --  7.4*      Recent Labs  Lab 07/06/24 1210 07/06/24 1217 07/06/24 1408 07/06/24 2236 07/07/24 0349 07/07/24 0404 07/07/24 1330 07/08/24 0306 07/08/24 0609 07/09/24 0334  CRP  --   --   --   --   --   --   --   --  1.3*  --   PROCALCITON  --   --   --   --   --   --   --   --  1.29  --   LATICACIDVEN 7.3*  --  5.6* 3.1*  --  2.0*  --   --   --   --   INR  --  1.5*  --   --   --   --   --   --   --   --   TSH  --   --   --   --  5.340*  --   --   --   --   --   AMMONIA  --   --   --   --   --   --  37*  --   --   --   MG  --  1.7  --   --   --   --   --   --  1.3* 1.7  CALCIUM  --  8.0*  --   --  7.5*  --   --  7.3*  --  7.4*    --------------------------------------------------------------------------------------------------------------- Lab Results  Component Value Date   CHOL 153 11/24/2023   HDL 52 11/24/2023   LDLCALC 78 11/24/2023   TRIG 114 11/24/2023   CHOLHDL 2.9 11/24/2023    Lab Results  Component Value Date   HGBA1C 4.6 (L) 11/23/2023   Recent Labs    07/07/24 0349  TSH 5.340*   Recent Labs    07/07/24 0349  VITAMINB12 295   ------------------------------------------------------------------------------------------------------------------ Cardiac Enzymes No results for input(s): CKMB, TROPONINI, MYOGLOBIN in the last 168 hours.  Invalid input(s): CK  Micro Results Recent Results (from the past 240 hours)  Resp panel by RT-PCR (RSV, Flu A&B, Covid) Anterior Nasal Swab     Status: None   Collection Time: 07/06/24 11:46 AM   Specimen: Anterior Nasal Swab  Result Value Ref Range Status   SARS Coronavirus 2 by RT PCR  NEGATIVE NEGATIVE Final   Influenza A by PCR NEGATIVE NEGATIVE Final   Influenza B by PCR NEGATIVE NEGATIVE Final    Comment: (NOTE) The Xpert Xpress SARS-CoV-2/FLU/RSV plus assay is intended as an aid in the diagnosis of influenza from Nasopharyngeal swab specimens and should not be used as a sole basis for treatment. Nasal washings and aspirates are unacceptable for Xpert Xpress SARS-CoV-2/FLU/RSV testing.  Fact Sheet for Patients: bloggercourse.com  Fact Sheet for Healthcare Providers: seriousbroker.it  This test is not yet approved or cleared by the [place]  FDA and has been authorized for detection and/or diagnosis of SARS-CoV-2 by FDA under an Emergency Use Authorization (EUA). This EUA will remain in effect (meaning this test can be used) for the duration of the COVID-19 declaration under Section 564(b)(1) of the Act, 21 U.S.C. section 360bbb-3(b)(1), unless the authorization is terminated or revoked.     Resp Syncytial Virus by PCR NEGATIVE NEGATIVE Final    Comment: (NOTE) Fact Sheet for Patients: bloggercourse.com  Fact Sheet for Healthcare Providers: seriousbroker.it  This test is not yet approved or cleared by the [place]   FDA and has been authorized for detection and/or diagnosis of SARS-CoV-2 by FDA under an Emergency Use Authorization (EUA). This EUA will remain in effect (meaning this test can be used) for the duration of the COVID-19 declaration under Section 564(b)(1) of the Act, 21 U.S.C. section 360bbb-3(b)(1), unless the authorization is terminated or revoked.  Performed at [facility], [address], [place], [place] [address]   Blood Culture (routine x 2)     Status: None (Preliminary result)   Collection Time: 07/06/24 11:51 AM   Specimen: BLOOD RIGHT ARM  Result Value Ref Range Status   Specimen Description BLOOD RIGHT ARM  Final    Special Requests   Final    BOTTLES DRAWN AEROBIC AND ANAEROBIC Blood Culture adequate volume   Culture   Final    NO GROWTH 3 DAYS Performed at [facility], [address] [place], [place] [address]    Report Status PENDING  Incomplete  Blood Culture (routine x 2)     Status: None (Preliminary result)   Collection Time: 07/06/24 12:18 PM   Specimen: BLOOD  Result Value Ref Range Status   Specimen Description BLOOD  Final   Special Requests NONE  Final   Culture   Final    NO GROWTH 3 DAYS Performed at [facility], [address], [place], [place] [address]    Report Status PENDING  Incomplete  MRSA Next Gen by PCR, Nasal     Status: None   Collection Time: 07/07/24  5:27 PM   Specimen: Nasal Mucosa; Nasal Swab  Result Value Ref Range Status   MRSA by PCR Next Gen NOT DETECTED NOT DETECTED Final    Comment: (NOTE) The GeneXpert MRSA Assay (FDA approved for NASAL specimens only), is one component of a comprehensive MRSA colonization surveillance program. It is not intended to diagnose MRSA infection nor to guide or monitor treatment for MRSA infections. Test performance is not FDA approved in patients less than 5 years old. Performed at [facility], [address] [place], [place] [address]   Respiratory (~20 pathogens) panel by PCR     Status: None   Collection Time: 07/08/24 10:13 AM   Specimen: Nasopharyngeal Swab; Respiratory  Result Value Ref Range Status   Adenovirus NOT DETECTED NOT DETECTED Final   Coronavirus 229E NOT DETECTED NOT DETECTED Final    Comment: (NOTE) The Coronavirus on the Respiratory Panel, DOES NOT test for the novel  Coronavirus (2019 nCoV)    Coronavirus HKU1 NOT DETECTED NOT DETECTED Final   Coronavirus NL63 NOT DETECTED NOT DETECTED Final   Coronavirus OC43 NOT DETECTED NOT DETECTED Final   Metapneumovirus NOT DETECTED NOT DETECTED Final   Rhinovirus / Enterovirus NOT DETECTED NOT DETECTED Final   Influenza A NOT  DETECTED NOT DETECTED Final   Influenza B NOT DETECTED NOT DETECTED Final   Parainfluenza Virus 1 NOT DETECTED NOT DETECTED Final   Parainfluenza Virus 2 NOT DETECTED NOT DETECTED Final   Parainfluenza Virus 3 NOT DETECTED NOT DETECTED Final   Parainfluenza Virus 4 NOT DETECTED NOT DETECTED Final   Respiratory Syncytial Virus NOT DETECTED NOT DETECTED Final   Bordetella pertussis NOT DETECTED NOT DETECTED Final   Bordetella Parapertussis NOT DETECTED NOT DETECTED Final   Chlamydophila pneumoniae NOT DETECTED NOT DETECTED Final   Mycoplasma pneumoniae NOT DETECTED NOT DETECTED Final    Comment: Performed at [facility], [address], [place], [place] [address]  Radiology Reports  US  Abdomen Limited RUQ (LIVER/GB) Result Date: 07/08/2024 CLINICAL DATA:  Transaminitis EXAM: ULTRASOUND ABDOMEN LIMITED RIGHT UPPER QUADRANT COMPARISON:  July 06, 2024.  November 23, 2023. FINDINGS: Gallbladder: No gallstones or wall thickening visualized. No sonographic Murphy sign noted by sonographer. 6 mm polyp is noted. Sludge is noted. Common bile duct: Diameter: 4 mm which is within normal limits. Liver: No focal lesion identified. Increased echogenicity of hepatic parenchyma is noted suggesting hepatic steatosis. Portal vein is patent on color Doppler imaging with normal direction of blood flow towards the liver. Other: None. IMPRESSION: 1. Hepatic steatosis. 2. 6 mm gallbladder polyp is noted. Follow-up ultrasound in 1 year is recommended to ensure stability. 3. Sludge is noted within gallbladder lumen. No cholelithiasis or cholecystitis is noted. Electronically Signed   By: Lynwood Landy Raddle M.D.   On: 07/08/2024 15:25   DG Chest Port 1 View Result Date: 07/08/2024 CLINICAL DATA:  Shortness of breath. EXAM: PORTABLE CHEST 1 VIEW COMPARISON:  07/06/2024 FINDINGS: The lungs are clear without focal pneumonia, edema, pneumothorax or pleural effusion. Cardiopericardial silhouette is at upper limits of normal  for size. No acute bony abnormality. Telemetry leads overlie the chest. IMPRESSION: No active disease. Electronically Signed   By: Camellia Candle M.D.   On: 07/08/2024 06:15      Signature  -   Brayton Lye M.D on 07/09/2024 at 4:12 PM   -  To page go to [email or web address]       07/09/2024, 4:12 PM   "

## 2024-07-10 ENCOUNTER — Inpatient Hospital Stay (HOSPITAL_COMMUNITY)

## 2024-07-10 DIAGNOSIS — G9340 Encephalopathy, unspecified: Secondary | ICD-10-CM | POA: Diagnosis not present

## 2024-07-10 LAB — BASIC METABOLIC PANEL WITH GFR
Anion gap: 11 (ref 5–15)
BUN: 16 mg/dL (ref 8–23)
CO2: 24 mmol/L (ref 22–32)
Calcium: 7.3 mg/dL — ABNORMAL LOW (ref 8.9–10.3)
Chloride: 103 mmol/L (ref 98–111)
Creatinine, Ser: 0.89 mg/dL (ref 0.61–1.24)
GFR, Estimated: >60 mL/min
Glucose, Bld: 95 mg/dL (ref 70–99)
Potassium: 3.5 mmol/L (ref 3.5–5.1)
Sodium: 139 mmol/L (ref 135–145)

## 2024-07-10 LAB — CBC
HCT: 25.5 % — ABNORMAL LOW (ref 39.0–52.0)
Hemoglobin: 9.1 g/dL — ABNORMAL LOW (ref 13.0–17.0)
MCH: 38.6 pg — ABNORMAL HIGH (ref 26.0–34.0)
MCHC: 35.7 g/dL (ref 30.0–36.0)
MCV: 108.1 fL — ABNORMAL HIGH (ref 80.0–100.0)
Platelets: 60 K/uL — ABNORMAL LOW (ref 150–400)
RBC: 2.36 MIL/uL — ABNORMAL LOW (ref 4.22–5.81)
RDW: 13.6 % (ref 11.5–15.5)
WBC: 3 K/uL — ABNORMAL LOW (ref 4.0–10.5)
nRBC: 0 % (ref 0.0–0.2)

## 2024-07-10 LAB — PHOSPHORUS: Phosphorus: 2.3 mg/dL — ABNORMAL LOW (ref 2.5–4.6)

## 2024-07-10 LAB — MAGNESIUM: Magnesium: 1.8 mg/dL (ref 1.7–2.4)

## 2024-07-10 MED ORDER — ENSURE PLUS HIGH PROTEIN PO LIQD
237.0000 mL | Freq: Two times a day (BID) | ORAL | Status: DC
  Administered 2024-07-10 – 2024-07-15 (×10): 237 mL via ORAL
  Filled 2024-07-10 (×2): qty 237

## 2024-07-10 MED ORDER — MAGIC MOUTHWASH
10.0000 mL | Freq: Four times a day (QID) | ORAL | Status: DC
  Administered 2024-07-10 – 2024-07-16 (×15): 10 mL via ORAL
  Filled 2024-07-10 (×28): qty 10

## 2024-07-10 MED ORDER — K PHOS MONO-SOD PHOS DI & MONO 155-852-130 MG PO TABS
500.0000 mg | ORAL_TABLET | Freq: Three times a day (TID) | ORAL | Status: AC
  Administered 2024-07-10 (×3): 500 mg via ORAL
  Filled 2024-07-10 (×3): qty 2

## 2024-07-10 MED ORDER — SUCRALFATE 1 GM/10ML PO SUSP
1.0000 g | Freq: Three times a day (TID) | ORAL | Status: DC
  Administered 2024-07-10 – 2024-07-16 (×22): 1 g via ORAL
  Filled 2024-07-10 (×24): qty 10

## 2024-07-10 MED ORDER — MAGNESIUM SULFATE IN D5W 1-5 GM/100ML-% IV SOLN
1.0000 g | Freq: Once | INTRAVENOUS | Status: AC
  Administered 2024-07-10: 1 g via INTRAVENOUS
  Filled 2024-07-10: qty 100

## 2024-07-10 MED ORDER — POTASSIUM CHLORIDE CRYS ER 20 MEQ PO TBCR
40.0000 meq | EXTENDED_RELEASE_TABLET | Freq: Once | ORAL | Status: AC
  Administered 2024-07-10: 40 meq via ORAL
  Filled 2024-07-10: qty 2

## 2024-07-10 NOTE — Plan of Care (Signed)
" °  Problem: Fluid Volume: Goal: Hemodynamic stability will improve Outcome: Progressing   Problem: Clinical Measurements: Goal: Signs and symptoms of infection will decrease Outcome: Progressing   Problem: Respiratory: Goal: Ability to maintain adequate ventilation will improve Outcome: Progressing   Problem: Safety: Goal: Non-violent Restraint(s) Outcome: Progressing   Problem: Fluid Volume: Goal: Hemodynamic stability will improve Outcome: Progressing   Problem: Clinical Measurements: Goal: Signs and symptoms of infection will decrease Outcome: Progressing   Problem: Respiratory: Goal: Ability to maintain adequate ventilation will improve Outcome: Progressing   Problem: Safety: Goal: Non-violent Restraint(s) Outcome: Progressing   Problem: Fluid Volume: Goal: Hemodynamic stability will improve Outcome: Progressing   Problem: Clinical Measurements: Goal: Signs and symptoms of infection will decrease Outcome: Progressing   Problem: Respiratory: Goal: Ability to maintain adequate ventilation will improve Outcome: Progressing   Problem: Safety: Goal: Non-violent Restraint(s) Outcome: Progressing   "

## 2024-07-10 NOTE — TOC Progression Note (Addendum)
 Transition of Care [facility]) - Progression Note    Patient Details  Name: Gabriel Everett MRN: [id number] Date of Birth: 05/11/1946  [facility] CM/SW Contact  Inocente GORMAN Kindle, LCSW Phone Number: 07/10/2024, 1:12 PM  Clinical Narrative:    CSW spoke with patient's grandson and provided SNF bed offers. He requested CSW ask [employer] if they can do an out of network agreement. [employer] is checking with their business office and will let CSW know.   CSW spoke with patient's grandson and updated him on bed offers. He reported agreement with [facility]. CSW will begin insurance process.   Insurance approval received for [facility] [phone], approved til 1/29).   Expected Discharge Plan: Skilled Nursing Facility Barriers to Discharge: Continued Medical Work up, English As A Second Language Teacher, SNF Pending bed offer               Expected Discharge Plan and Services In-house Referral: Clinical Social Work   Post Acute Care Choice: Skilled Nursing Facility Living arrangements for the past 2 months: Single Family Home                                       Social Drivers of Health (SDOH) Interventions SDOH Screenings   Food Insecurity: No Food Insecurity (07/07/2024)  Housing: Low Risk (07/07/2024)  Transportation Needs: No Transportation Needs (07/07/2024)  Utilities: Not At Risk (07/07/2024)  Social Connections: Socially Isolated (07/07/2024)  Tobacco Use: Low Risk (07/09/2024)    Readmission Risk Interventions     No data to display

## 2024-07-10 NOTE — Progress Notes (Signed)
 Physical Therapy Treatment Patient Details Name: Gabriel Everett MRN: [id number] DOB: 11-12-1945 Today's Date: 07/10/2024   History of Present Illness 79 y.o. male presents 07/06/24 with AMS and weakness. Possible seizure activity en route to ED.  Found to have hypotension, AKI, elevated lactic acid, mild leukocytosis. No source for infection, concern for SIRS/sepsis versus seizure. EEG study results within normal limits.   PMH: HTN, afib, and alcohol  use.    PT Comments  Pt demonstrates improved mobility this session though continues to be limited due to fatigue and altered cognition. Pt requires increased time to process cues and follows them inconsistently. Pt has difficulty performing seated exercises correctly despite visual and verbal cues being provided. Pt requires CGA/minA to perform bed mobility and demonstrates improved seated balance EOB. Pt requires min-modA to complete STS from varying surfaces, requiring cues to promote upright posture due to significant trunk flexion noted. Pt completes multiple step pivot transfers minA with light assist to manage walker. Pt would benefit from continued PT services focused on bed mobility, transfers, strength, and balance to promote improved functional mobility.       If plan is discharge home, recommend the following: A little help with walking and/or transfers;A little help with bathing/dressing/bathroom;Assistance with cooking/housework;Direct supervision/assist for medications management;Direct supervision/assist for financial management;Assist for transportation;Help with stairs or ramp for entrance;Supervision due to cognitive status   Can travel by private vehicle     No (Cognition, level of assist)  Equipment Recommendations  Wheelchair (measurements PT);Wheelchair cushion (measurements PT);BSC/3in1    Recommendations for Other Services       Precautions / Restrictions Precautions Precautions: Fall Recall of Precautions/Restrictions:  Impaired Precaution/Restrictions Comments: Seizure Restrictions Weight Bearing Restrictions Per Provider Order: No     Mobility  Bed Mobility Overal bed mobility: Needs Assistance Bed Mobility: Supine to Sit, Sit to Supine     Supine to sit: Contact guard, Min assist Sit to supine: Contact guard assist   General bed mobility comments: Pt takes additional time to transfer to EOB, light trunk assist provided to complete transfer. Pt able to bring B LE into bed and reposition self.    Transfers Overall transfer level: Needs assistance Equipment used: Rolling walker (2 wheels) Transfers: Sit to/from Stand, Bed to chair/wheelchair/BSC Sit to Stand: Min assist, Mod assist   Step pivot transfers: Min assist       General transfer comment: Pt modA to stand from lower surfaces. Improves to minA with repetition, completes STS x3 throughout session. Step pivot x3 completed to go bed to chair, chair to commode, and commode to bed. Some assist needed to manage walke safely.    Ambulation/Gait               General Gait Details: Pt fatigues quickly and declines further ambulation.   Stairs             Wheelchair Mobility     Tilt Bed    Modified Rankin (Stroke Patients Only)       Balance Overall balance assessment: Needs assistance Sitting-balance support: Bilateral upper extremity supported Sitting balance-Leahy Scale: Fair Sitting balance - Comments: Guarding during seated balance, improved trunk control but increased sway noted with onset of fatigue.   Standing balance support: Bilateral upper extremity supported, During functional activity, Reliant on assistive device for balance Standing balance-Leahy Scale: Poor Standing balance comment: Reliant on AD and external support to maintain balance. Significant trunk flexion noted, able to correct temporarily but does not maintain.  Communication  Communication Communication: Impaired Factors Affecting Communication: Reduced clarity of speech  Cognition Arousal: Alert Behavior During Therapy: Flat affect   PT - Cognitive impairments: Awareness, Safety/Judgement, Problem solving, Sequencing, Initiation, Attention, Memory                         Following commands: Impaired Following commands impaired: Follows one step commands inconsistently, Follows one step commands with increased time    Cueing Cueing Techniques: Verbal cues, Visual cues, Tactile cues, Gestural cues  Exercises General Exercises - Lower Extremity Ankle Circles/Pumps: AROM, 5 reps, Both, Seated Long Arc Quad: AROM, Both, 5 reps, Seated Hip ABduction/ADduction: AROM, Both, 5 reps, Seated Hip Flexion/Marching: AROM, Both, 5 reps, Seated    General Comments General comments (skin integrity, edema, etc.): HR to 150 while toileting. HR ranges 50s-150s throughout session. Pt denies any symptoms besides fatigue. No new skin abnormalities noted.      Pertinent Vitals/Pain Pain Assessment Pain Assessment: No/denies pain (Reports feeling stiff from lying in bed but denies pain)    Home Living                          Prior Function            PT Goals (current goals can now be found in the care plan section) Acute Rehab PT Goals Patient Stated Goal: None stated Progress towards PT goals: Progressing toward goals    Frequency    Min 2X/week      PT Plan      Co-evaluation              AM-PAC PT 6 Clicks Mobility   Outcome Measure  Help needed turning from your back to your side while in a flat bed without using bedrails?: None Help needed moving from lying on your back to sitting on the side of a flat bed without using bedrails?: A Little Help needed moving to and from a bed to a chair (including a wheelchair)?: A Little Help needed standing up from a chair using your arms (e.g., wheelchair or bedside chair)?: A  Little Help needed to walk in hospital room?: Total Help needed climbing 3-5 steps with a railing? : Total 6 Click Score: 15    End of Session Equipment Utilized During Treatment: Gait belt Activity Tolerance: Patient limited by fatigue Patient left: in bed;with call bell/phone within reach;with bed alarm set Nurse Communication: Mobility status PT Visit Diagnosis: Unsteadiness on feet (R26.81);Muscle weakness (generalized) (M62.81)     Time: 9091-9058 PT Time Calculation (min) (ACUTE ONLY): 33 min  Charges:    $Therapeutic Exercise: 8-22 mins $Therapeutic Activity: 8-22 mins PT General Charges $$ ACUTE PT VISIT: 1 Visit                     Gabriel Everett, PT, DPT  Acute Rehabilitation Services         Office: [phone]      Gabriel MARLA Everett 07/10/2024, 1:35 PM

## 2024-07-10 NOTE — Progress Notes (Signed)
 " PROGRESS NOTE    Gabriel Everett  FMW:[id number] DOB: 08/16/1945 DOA: 07/06/2024 PCP: [name], FNP   Brief Narrative:  79 year old with past medical history significant for hypertension, A-fib, alcohol  use presented with altered mental status and weakness.  Patient was found down on the floor by family.  EMS was contacted.  Patient was noted to be confused.  Patient was recently treated for the flu a week ago.  Patient was noted to have multiple skin tears.  On EMS evaluation initial blood pressure was in the 70s.  Improved with IV fluids.  Patient and route had seizure lasting 1 minute with tonic-clonic activity.  Evaluation in the ED presented with a creatinine of 2.7 baseline of 1, bilirubin 3.3, white blood cell 11, hemoglobin 11, platelets 108, INR 1.5, lactic acid 7 subsequently repeated 5.  Respiratory panel, flu COVID RSV negative.  Chest x-ray showed no acute abnormality.  CT head showed no acute abnormality.  CT of chest abdomen and pelvis showed no acute abnormality but did show some hepatic steatosis   Assessment & Plan:   Acute metabolic encephalopathy, down for several hours at home, severe dehydration, hypokalemia, hypomagnesemia, questionable Seizure-like activity versus myoclonic jerks. Was found down by family, patient although somnolent tells me today that he was down for several hours, MRI brain and EEG nonacute, he denies any headache, he appears severely dehydrated with severe electrolyte abnormalities, being aggressively hydrated, electrolytes replaced, PT OT and speech.   - PT/OT input appreciated, will need SNF placement  - Improving .  AKI Mild rhabdo -AKI on presentation, with elevated total CK, with evidence of urine dipstick for blood while no RBCs seen in hpf. - Resolved with IV fluids   Hypokalemia Hypomagnesemia Hypophosphatemia - Replaced, continue to monitor closely and replace as needed  SIRS Hypothermia Due to being exposed to elements and  down for several hours, supportive care, TSH stable, B12 borderline low which will be replaced, no signs of infectious focus sipped possible mild UTI.  Taper down antibiotics for possible mild UTI.  Coffee Ground emesis;  Read by family, IV PPI, H&H is trending down which may be dilutional, no further evidence of coffee-ground emesis or melena or significant GI bleed. - Does report some difficulty swallowing, likely underlying severe esophagitis, will add Carafate , Magic mouthwash.  AKI Metabolic acidosis elevated anion gap Due to dehydration, baseline creatinine around 1, hydrate, renal function improving, monitor bladder scans, check CK.  Hypertension - Hold propranolol  due to soft blood pressure  A-fib - Hold beta-blocker to avoid future hypotension -Was not on anticoagulation prior to admission, and I will refrain from starting in the setting of his falls, thrombocytopenia.  Alcohol  use Alcohol   withdrawal -Drinks 2 alcoholic mixed with , 2 shots of vodka daily - High dose  thiamine  for 3 days, no signs of DTs he tells me that he drinks some alcohol  but not too much, no tremors.  Continue to monitor off of CIWA.  Thrombocytopenia could be related to infectious process versus alcohol .  Monitor trending down initially, but this currently has stabilized.  Mild asymptomatic transaminitis. - Negative hepatitis panel - Ultrasound significant for hepatic steatosis - This is secondary to alcohol  abuse   Estimated body mass index is 22.24 kg/m as calculated from the following:   Height as of this encounter: 5' 10 (1.778 m).   Weight as of this encounter: 70.3 kg.   DVT prophylaxis: SCD, holding on pharmacologic anticoagulation due to thrombocytopenia Code Status: Full code  Family Communication: Discussed with grandson at bedside yesterday evening Disposition Plan: To SNF tomorrow Status is: Inpatient Remains inpatient appropriate because: Management of acute encephalopathy  AKI    Consultants:  None  Procedures:  none  Antimicrobials:    Subjective:  Patient reports he is feeling better today, appetite has improved   Objective: Vitals:   07/10/24 0000 07/10/24 0413 07/10/24 0600 07/10/24 0811  BP: 134/63 120/80  121/83  Pulse: 69 70 65 72  Resp: (!) 23 (!) 21 18   Temp: 97.7 F (36.5 C) 98.3 F (36.8 C)    TempSrc: Oral Oral    SpO2:  98% 96%   Weight:      Height:        Intake/Output Summary (Last 24 hours) at 07/10/2024 1456 Last data filed at 07/10/2024 0043 Gross per 24 hour  Intake --  Output 400 ml  Net -400 ml   Filed Weights   07/06/24 1136  Weight: 70.3 kg    Examination:   Awake Alert, Oriented X 3, interactive today, extremely frail Good air entry Irregular +ve B.Sounds, Abd Soft, No tenderness, No rebound - guarding or rigidity. No Cyanosis, Clubbing or edema, No new Rash or bruise, but he is having significant bruising all over his extremities     Data Review:   Patient Lines/Drains/Airways Status     Active Line/Drains/Airways     Name Placement date Placement time Site Days   Peripheral IV 07/07/24 18 G Anterior;Right Forearm 07/07/24  1122  Forearm  3   External Urinary Catheter 07/07/24  1307  --  3   Wound 07/07/24 1246 Other (Comment) Arm Lower;Posterior;Right 07/07/24  1246  Arm  3   Wound 07/07/24 1247 Hand Left;Posterior 07/07/24  1247  Hand  3   Wound 07/07/24 1250 Pressure Injury Buttocks Deep Tissue Pressure Injury - Purple or maroon localized area of discolored intact skin or blood-filled blister due to damage of underlying soft tissue from pressure and/or shear. 07/07/24  1250  Buttocks  3   Wound 07/07/24 1300 Traumatic Arm Left;Posterior;Upper 07/07/24  1300  Arm  3   Wound 07/07/24 1300 Traumatic Arm Left;Lower;Posterior;Proximal 07/07/24  1300  Arm  3             Inpatient Medications  Scheduled Meds:  acyclovir  ointment  1 Application Topical Q3H   [START ON 07/11/2024]  vitamin B-12  1,000 mcg Oral Daily   feeding supplement  237 mL Oral BID BM   folic acid   1 mg Oral Daily   magic mouthwash  10 mL Oral QID   multivitamin with minerals  1 tablet Oral Daily   pantoprazole  (PROTONIX ) IV  40 mg Intravenous Q12H   phosphorus  500 mg Oral TID   propranolol   20 mg Oral BID   sucralfate   1 g Oral TID WC & HS   Continuous Infusions:  thiamine  (VITAMIN B1) injection 500 mg (07/09/24 1942)   PRN Meds:.acetaminophen  **OR** acetaminophen , ipratropium-albuterol , metoprolol  tartrate, polyethylene glycol  DVT Prophylaxis   Recent Labs  Lab 07/06/24 1217 07/07/24 0349 07/08/24 0306 07/09/24 0334 07/10/24 0316  WBC 11.0* 9.2 4.7 3.9* 3.0*  HGB 11.4* 10.6* 9.6* 9.9* 9.1*  HCT 34.7* 29.7* 26.5* 27.2* 25.5*  PLT 108* 101* 64* 59* 60*  MCV 118.0* 111.2* 106.0* 106.7* 108.1*  MCH 38.8* 39.7* 38.4* 38.8* 38.6*  MCHC 32.9 35.7 36.2* 36.4* 35.7  RDW 13.5 13.7 13.3 13.3 13.6  LYMPHSABS 0.7  --   --  0.7  --  MONOABS 0.6  --   --  0.3  --   EOSABS 0.0  --   --  0.0  --   BASOSABS 0.0  --   --  0.0  --     Recent Labs  Lab 07/06/24 1210 07/06/24 1217 07/06/24 1408 07/06/24 2236 07/07/24 0349 07/07/24 0404 07/07/24 1330 07/08/24 0306 07/08/24 0609 07/09/24 0334 07/10/24 0316  NA 134* 135  --   --  137  --   --  138  --  136 139  K 4.3 4.5  --   --  4.3  --   --  2.8*  --  4.0 3.5  CL 102 94*  --   --  96*  --   --  99  --  102 103  CO2  --  10*  --   --  15*  --   --  26  --  28 24  ANIONGAP  --  32*  --   --  25*  --   --  13  --  7 11  GLUCOSE 82 84  --   --  111*  --   --  177*  --  127* 95  BUN 40* 37*  --   --  55*  --   --  38*  --  17 16  CREATININE 3.10* 2.74*  --   --  3.08*  --   --  1.67*  --  0.96 0.89  AST  --  92*  --   --  121*  --   --  155*  --  64*  --   ALT  --  46*  --   --  57*  --   --  68*  --  45*  --   ALKPHOS  --  63  --   --  51  --   --  52  --  54  --   BILITOT  --  3.3*  --   --  1.8*  --   --  1.0  --  1.3*  --    ALBUMIN  --  3.4*  --   --  3.1*  --   --  2.7*  --  2.7*  --   CRP  --   --   --   --   --   --   --   --  1.3*  --   --   PROCALCITON  --   --   --   --   --   --   --   --  1.29  --   --   LATICACIDVEN 7.3*  --  5.6* 3.1*  --  2.0*  --   --   --   --   --   INR  --  1.5*  --   --   --   --   --   --   --   --   --   TSH  --   --   --   --  5.340*  --   --   --   --   --   --   AMMONIA  --   --   --   --   --   --  37*  --   --   --   --   MG  --  1.7  --   --   --   --   --   --  1.3* 1.7 1.8  PHOS  --   --   --   --   --   --   --   --   --  <1.0* 2.3*  CALCIUM  --  8.0*  --   --  7.5*  --   --  7.3*  --  7.4* 7.3*      Recent Labs  Lab 07/06/24 1210 07/06/24 1217 07/06/24 1408 07/06/24 2236 07/07/24 0349 07/07/24 0404 07/07/24 1330 07/08/24 0306 07/08/24 0609 07/09/24 0334 07/10/24 0316  CRP  --   --   --   --   --   --   --   --  1.3*  --   --   PROCALCITON  --   --   --   --   --   --   --   --  1.29  --   --   LATICACIDVEN 7.3*  --  5.6* 3.1*  --  2.0*  --   --   --   --   --   INR  --  1.5*  --   --   --   --   --   --   --   --   --   TSH  --   --   --   --  5.340*  --   --   --   --   --   --   AMMONIA  --   --   --   --   --   --  37*  --   --   --   --   MG  --  1.7  --   --   --   --   --   --  1.3* 1.7 1.8  CALCIUM  --  8.0*  --   --  7.5*  --   --  7.3*  --  7.4* 7.3*    --------------------------------------------------------------------------------------------------------------- Lab Results  Component Value Date   CHOL 153 11/24/2023   HDL 52 11/24/2023   LDLCALC 78 11/24/2023   TRIG 114 11/24/2023   CHOLHDL 2.9 11/24/2023    Lab Results  Component Value Date   HGBA1C 4.6 (L) 11/23/2023   No results for input(s): TSH, T4TOTAL, FREET4, T3FREE, THYROIDAB in the last 72 hours.  No results for input(s): VITAMINB12, FOLATE, FERRITIN, TIBC, IRON, RETICCTPCT in the last 72  hours.  ------------------------------------------------------------------------------------------------------------------ Cardiac Enzymes No results for input(s): CKMB, TROPONINI, MYOGLOBIN in the last 168 hours.  Invalid input(s): CK  Micro Results Recent Results (from the past 240 hours)  Resp panel by RT-PCR (RSV, Flu A&B, Covid) Anterior Nasal Swab     Status: None   Collection Time: 07/06/24 11:46 AM   Specimen: Anterior Nasal Swab  Result Value Ref Range Status   SARS Coronavirus 2 by RT PCR NEGATIVE NEGATIVE Final   Influenza A by PCR NEGATIVE NEGATIVE Final   Influenza B by PCR NEGATIVE NEGATIVE Final    Comment: (NOTE) The Xpert Xpress SARS-CoV-2/FLU/RSV plus assay is intended as an aid in the diagnosis of influenza from Nasopharyngeal swab specimens and should not be used as a sole basis for treatment. Nasal washings and aspirates are unacceptable for Xpert Xpress SARS-CoV-2/FLU/RSV testing.  Fact Sheet for Patients: bloggercourse.com  Fact Sheet for Healthcare Providers: seriousbroker.it  This test is not yet approved or cleared by the [place]  FDA and has been authorized for detection and/or diagnosis of SARS-CoV-2 by FDA under  an Emergency Use Authorization (EUA). This EUA will remain in effect (meaning this test can be used) for the duration of the COVID-19 declaration under Section 564(b)(1) of the Act, 21 U.S.C. section 360bbb-3(b)(1), unless the authorization is terminated or revoked.     Resp Syncytial Virus by PCR NEGATIVE NEGATIVE Final    Comment: (NOTE) Fact Sheet for Patients: bloggercourse.com  Fact Sheet for Healthcare Providers: seriousbroker.it  This test is not yet approved or cleared by the [place]  FDA and has been authorized for detection and/or diagnosis of SARS-CoV-2 by FDA under an Emergency Use Authorization (EUA).  This EUA will remain in effect (meaning this test can be used) for the duration of the COVID-19 declaration under Section 564(b)(1) of the Act, 21 U.S.C. section 360bbb-3(b)(1), unless the authorization is terminated or revoked.  Performed at [facility], [address] [place], [place] [address]   Blood Culture (routine x 2)     Status: None (Preliminary result)   Collection Time: 07/06/24 11:51 AM   Specimen: BLOOD RIGHT ARM  Result Value Ref Range Status   Specimen Description BLOOD RIGHT ARM  Final   Special Requests   Final    BOTTLES DRAWN AEROBIC AND ANAEROBIC Blood Culture adequate volume   Culture   Final    NO GROWTH 4 DAYS Performed at [facility], [address], [place], [place] [address]    Report Status PENDING  Incomplete  Blood Culture (routine x 2)     Status: None (Preliminary result)   Collection Time: 07/06/24 12:18 PM   Specimen: BLOOD  Result Value Ref Range Status   Specimen Description BLOOD  Final   Special Requests NONE  Final   Culture   Final    NO GROWTH 4 DAYS Performed at [facility], [address] [place], [place] [address]    Report Status PENDING  Incomplete  MRSA Next Gen by PCR, Nasal     Status: None   Collection Time: 07/07/24  5:27 PM   Specimen: Nasal Mucosa; Nasal Swab  Result Value Ref Range Status   MRSA by PCR Next Gen NOT DETECTED NOT DETECTED Final    Comment: (NOTE) The GeneXpert MRSA Assay (FDA approved for NASAL specimens only), is one component of a comprehensive MRSA colonization surveillance program. It is not intended to diagnose MRSA infection nor to guide or monitor treatment for MRSA infections. Test performance is not FDA approved in patients less than 61 years old. Performed at [facility], [address] [place], [place] [address]   Respiratory (~20 pathogens) panel by PCR     Status: None   Collection Time: 07/08/24 10:13 AM   Specimen: Nasopharyngeal Swab; Respiratory  Result  Value Ref Range Status   Adenovirus NOT DETECTED NOT DETECTED Final   Coronavirus 229E NOT DETECTED NOT DETECTED Final    Comment: (NOTE) The Coronavirus on the Respiratory Panel, DOES NOT test for the novel  Coronavirus (2019 nCoV)    Coronavirus HKU1 NOT DETECTED NOT DETECTED Final   Coronavirus NL63 NOT DETECTED NOT DETECTED Final   Coronavirus OC43 NOT DETECTED NOT DETECTED Final   Metapneumovirus NOT DETECTED NOT DETECTED Final   Rhinovirus / Enterovirus NOT DETECTED NOT DETECTED Final   Influenza A NOT DETECTED NOT DETECTED Final   Influenza B NOT DETECTED NOT DETECTED Final   Parainfluenza Virus 1 NOT DETECTED NOT DETECTED Final   Parainfluenza Virus 2 NOT DETECTED NOT DETECTED Final  Parainfluenza Virus 3 NOT DETECTED NOT DETECTED Final   Parainfluenza Virus 4 NOT DETECTED NOT DETECTED Final   Respiratory Syncytial Virus NOT DETECTED NOT DETECTED Final   Bordetella pertussis NOT DETECTED NOT DETECTED Final   Bordetella Parapertussis NOT DETECTED NOT DETECTED Final   Chlamydophila pneumoniae NOT DETECTED NOT DETECTED Final   Mycoplasma pneumoniae NOT DETECTED NOT DETECTED Final    Comment: Performed at [facility], [address] [place], [place] [address]    Radiology Reports  No results found.     Signature  -   Brayton Lye M.D on 07/10/2024 at 2:56 PM   -  To page go to [email or web address]       07/10/2024, 2:56 PM   "

## 2024-07-10 NOTE — Progress Notes (Signed)
 Speech Language Pathology Treatment: Dysphagia  Patient Details Name: Gabriel Everett MRN: [id number] DOB: 06/06/1946 Today's Date: 07/10/2024 Time: 9192-9183 SLP Time Calculation (min) (ACUTE ONLY): 9 min  Assessment / Plan / Recommendation Clinical Impression  Pt seen for ongoing dysphagia management.  Pt remains somewhat confused but mentation seems to be improving.  Reports having steak and peas for dinner (is on pureed diet).  Pt requried 4+ swallows with cup sip of thin liquid.  1-2 swallows with nectar thick liquid.  Vocal quality remains clear.  Pt reports sensation of burning in throat with PO intake. Per chart, pt has had some coffee ground emesis.  Pt accepted trials of solid today.  He exhibited good oral clearance.  Pt noted to take small bites and sips today.  Recommend further assessment of swallow function with instrumental evaluation.  Will plan for MBSS as rad/SLP schedules permit.  Recommend continuing puree diet with nectar thick liquids.     HPI HPI: Gabriel Everett is a 79 y.o. male who presented to ED 1/18 by EMS with altered mental status and weakness. Seizure activeity en route to ED lasting 1 min. Patient was alone and at baseline is able to be independent.  His grandson does come to check on him every now and then.  Found him on the floor yesterday.  Tried to care for him overnight but patient was getting up every hour and remained confused and had several more falls.  EMS was called this morning. Also reported he was diagnosed/treated for the flu about a week ago. MRI 1/18 with no acute findings.  Chest CT 1/18 with no evidence of infection. No prior ST eval/treat in EMR. Pt with with medical history significant of hypertension, atrial fibrillation, alcohol  use.      SLP Plan  MBS         Recommendations  Diet recommendations: Dysphagia 1 (puree);Nectar-thick liquid Liquids provided via: Cup;Straw Medication Administration: Crushed with puree Supervision:  Intermittent supervision to cue for compensatory strategies Compensations: Slow rate;Small sips/bites Postural Changes and/or Swallow Maneuvers: Seated upright 90 degrees                  Oral care BID     Dysphagia, unspecified (R13.10)     MBS     Anette FORBES Grippe, MA, CCC-SLP Acute Rehabilitation Services Office: [phone] 07/10/2024, 8:21 AM

## 2024-07-10 NOTE — Progress Notes (Signed)
 SLP Cancellation Note  Patient Details Name: Gabriel Everett MRN: [id number] DOB: 10/06/1945   Cancelled treatment:       Reason Eval/Treat Not Completed: Patient declined, no reason specified. Pt was scheduled for MBS this afternoon but when transport arrived to take him to radiology, the pt refused. Rad tech also called and spoke with nurse to try to talk to pt about completing MBS, but he still said no. Will have to attempt on subsequent date.     Leita SAILOR., M.A. CCC-SLP Acute Rehabilitation Services Office: [phone]  Secure chat preferred  07/10/2024, 12:43 PM

## 2024-07-11 ENCOUNTER — Inpatient Hospital Stay (HOSPITAL_COMMUNITY)

## 2024-07-11 DIAGNOSIS — F10931 Alcohol use, unspecified with withdrawal delirium: Secondary | ICD-10-CM | POA: Diagnosis not present

## 2024-07-11 DIAGNOSIS — G9340 Encephalopathy, unspecified: Secondary | ICD-10-CM | POA: Diagnosis not present

## 2024-07-11 LAB — CBC
HCT: 25.8 % — ABNORMAL LOW (ref 39.0–52.0)
Hemoglobin: 9.4 g/dL — ABNORMAL LOW (ref 13.0–17.0)
MCH: 38.7 pg — ABNORMAL HIGH (ref 26.0–34.0)
MCHC: 36.4 g/dL — ABNORMAL HIGH (ref 30.0–36.0)
MCV: 106.2 fL — ABNORMAL HIGH (ref 80.0–100.0)
Platelets: 76 K/uL — ABNORMAL LOW (ref 150–400)
RBC: 2.43 MIL/uL — ABNORMAL LOW (ref 4.22–5.81)
RDW: 13.2 % (ref 11.5–15.5)
WBC: 4.4 K/uL (ref 4.0–10.5)
nRBC: 0 % (ref 0.0–0.2)

## 2024-07-11 LAB — BASIC METABOLIC PANEL WITH GFR
Anion gap: 10 (ref 5–15)
BUN: 14 mg/dL (ref 8–23)
CO2: 23 mmol/L (ref 22–32)
Calcium: 7.8 mg/dL — ABNORMAL LOW (ref 8.9–10.3)
Chloride: 106 mmol/L (ref 98–111)
Creatinine, Ser: 0.89 mg/dL (ref 0.61–1.24)
GFR, Estimated: >60 mL/min
Glucose, Bld: 94 mg/dL (ref 70–99)
Potassium: 3.3 mmol/L — ABNORMAL LOW (ref 3.5–5.1)
Sodium: 139 mmol/L (ref 135–145)

## 2024-07-11 LAB — CULTURE, BLOOD (ROUTINE X 2)
Culture: NO GROWTH
Culture: NO GROWTH
Special Requests: ADEQUATE

## 2024-07-11 LAB — PHOSPHORUS: Phosphorus: 1.8 mg/dL — ABNORMAL LOW (ref 2.5–4.6)

## 2024-07-11 LAB — MAGNESIUM: Magnesium: 1.9 mg/dL (ref 1.7–2.4)

## 2024-07-11 MED ORDER — HALOPERIDOL LACTATE 5 MG/ML IJ SOLN
2.0000 mg | Freq: Once | INTRAMUSCULAR | Status: AC
  Administered 2024-07-11: 2 mg via INTRAVENOUS
  Filled 2024-07-11: qty 1

## 2024-07-11 MED ORDER — LORAZEPAM 2 MG/ML IJ SOLN
0.0000 mg | Freq: Three times a day (TID) | INTRAMUSCULAR | Status: AC
  Administered 2024-07-14: 2 mg via INTRAVENOUS
  Filled 2024-07-11: qty 1

## 2024-07-11 MED ORDER — CHLORDIAZEPOXIDE HCL 5 MG PO CAPS
10.0000 mg | ORAL_CAPSULE | Freq: Four times a day (QID) | ORAL | Status: DC
  Administered 2024-07-11: 10 mg via ORAL
  Filled 2024-07-11 (×2): qty 2

## 2024-07-11 MED ORDER — LORAZEPAM 2 MG/ML IJ SOLN: 1.0000 mg | INTRAMUSCULAR | Status: AC | PRN

## 2024-07-11 MED ORDER — LORAZEPAM 1 MG PO TABS: 1.0000 mg | ORAL_TABLET | ORAL | Status: AC | PRN

## 2024-07-11 MED ORDER — LACTATED RINGERS IV SOLN: INTRAVENOUS | Status: AC

## 2024-07-11 MED ORDER — POTASSIUM PHOSPHATES 15 MMOLE/5ML IV SOLN
30.0000 mmol | Freq: Once | INTRAVENOUS | Status: AC
  Administered 2024-07-11: 30 mmol via INTRAVENOUS
  Filled 2024-07-11: qty 10

## 2024-07-11 MED ORDER — K PHOS MONO-SOD PHOS DI & MONO 155-852-130 MG PO TABS
500.0000 mg | ORAL_TABLET | Freq: Two times a day (BID) | ORAL | Status: DC
  Administered 2024-07-11 – 2024-07-12 (×3): 500 mg via ORAL
  Filled 2024-07-11 (×3): qty 2

## 2024-07-11 MED ORDER — LORAZEPAM 2 MG/ML IJ SOLN
2.0000 mg | Freq: Once | INTRAMUSCULAR | Status: DC
  Filled 2024-07-11: qty 1

## 2024-07-11 MED ORDER — HYALURONIDASE HUMAN 150 UNIT/ML IJ SOLN
150.0000 [IU] | Freq: Once | INTRAMUSCULAR | Status: AC
  Administered 2024-07-11: 150 [IU] via SUBCUTANEOUS
  Filled 2024-07-11: qty 1

## 2024-07-11 MED ORDER — LORAZEPAM 2 MG/ML IJ SOLN
0.0000 mg | INTRAMUSCULAR | Status: AC
  Administered 2024-07-11 (×2): 2 mg via INTRAVENOUS
  Administered 2024-07-12: 3 mg via INTRAVENOUS
  Filled 2024-07-11 (×2): qty 2
  Filled 2024-07-11: qty 1

## 2024-07-11 NOTE — Plan of Care (Signed)
" °  Problem: Safety: Goal: Non-violent Restraint(s) Outcome: Progressing   Problem: Medication: Goal: Risk for medication side effects will decrease Outcome: Progressing   Problem: Clinical Measurements: Goal: Diagnostic test results will improve Outcome: Progressing   Problem: Coping: Goal: Level of anxiety will decrease Outcome: Progressing   "

## 2024-07-11 NOTE — Progress Notes (Signed)
 Speech Language Pathology Treatment: Dysphagia  Patient Details Name: Gabriel Everett MRN: [id number] DOB: 02-04-1946 Today's Date: 07/11/2024 Time: 9183-9173 SLP Time Calculation (min) (ACUTE ONLY): 10 min  Assessment / Plan / Recommendation Clinical Impression  Pt seen for ongoing dysphagia management.  He remains significantly confused.  Pt needed frequent redirection to participate in PO trails.  Pt required 5 swallows with thin liquid by straw x2.  Pt tolerated solid trials without any clinical s/s of aspiration.  There was prolonged oral phase with solid texture.  Pt talking without fully swallowing bolus.  Benefited from verbal cue.  RN aware of plan for MBS today after pt refused yesterday. Continue current diet at present.    HPI HPI: Gabriel Everett is a 79 y.o. male who presented to ED 1/18 by EMS with altered mental status and weakness. Seizure activeity en route to ED lasting 1 min. Patient was alone and at baseline is able to be independent.  His grandson does come to check on him every now and then.  Found him on the floor yesterday.  Tried to care for him overnight but patient was getting up every hour and remained confused and had several more falls.  EMS was called this morning. Also reported he was diagnosed/treated for the flu about a week ago. MRI 1/18 with no acute findings.  Chest CT 1/18 with no evidence of infection. No prior ST eval/treat in EMR. Pt with with medical history significant of hypertension, atrial fibrillation, alcohol  use.      SLP Plan  MBS         Recommendations  Diet recommendations: Dysphagia 1 (puree);Nectar-thick liquid Liquids provided via: Cup;Straw Medication Administration: Crushed with puree Supervision: Staff to assist with self feeding Compensations: Slow rate;Small sips/bites Postural Changes and/or Swallow Maneuvers: Seated upright 90 degrees                  Oral care BID   Frequent or constant  Supervision/Assistance Dysphagia, unspecified (R13.10)     MBS     Anette FORBES Grippe, MA, CCC-SLP Acute Rehabilitation Services Office: [phone] 07/11/2024, 8:30 AM

## 2024-07-11 NOTE — Plan of Care (Signed)
   Problem: Fluid Volume: Goal: Hemodynamic stability will improve Outcome: Progressing   Problem: Clinical Measurements: Goal: Diagnostic test results will improve Outcome: Progressing

## 2024-07-11 NOTE — TOC Progression Note (Signed)
 Transition of Care [facility]) - Progression Note    Patient Details  Name: Gabriel Everett MRN: [id number] Date of Birth: 1945/11/24  Transition of [facility] CM/SW Contact  Inocente GORMAN Kindle, LCSW Phone Number: 07/11/2024, 3:37 PM  Clinical Narrative:    Per MD, patient not stable for discharge. Updated grandson.    Expected Discharge Plan: Skilled Nursing Facility Barriers to Discharge: Continued Medical Work up            Expected Discharge Plan and Services In-house Referral: Clinical Social Work   Post Acute Care Choice: Skilled Nursing Facility Living arrangements for the past 2 months: Single Family Home                                       Social Drivers of Health (SDOH) Interventions SDOH Screenings   Food Insecurity: No Food Insecurity (07/07/2024)  Housing: Low Risk (07/07/2024)  Transportation Needs: No Transportation Needs (07/07/2024)  Utilities: Not At Risk (07/07/2024)  Social Connections: Socially Isolated (07/07/2024)  Tobacco Use: Low Risk (07/09/2024)    Readmission Risk Interventions     No data to display

## 2024-07-11 NOTE — Progress Notes (Signed)
 " PROGRESS NOTE    Gabriel Everett  FMW:[id number] DOB: Apr 27, 1946 DOA: 07/06/2024 PCP: [name], FNP   Brief Narrative:  79 year old with past medical history significant for hypertension, A-fib, alcohol  use presented with altered mental status and weakness.  Patient was found down on the floor by family.  EMS was contacted.  Patient was noted to be confused.  Patient was recently treated for the flu a week ago.  Patient was noted to have multiple skin tears.  On EMS evaluation initial blood pressure was in the 70s.  Improved with IV fluids.  Patient and route had seizure lasting 1 minute with tonic-clonic activity.  Evaluation in the ED presented with a creatinine of 2.7 baseline of 1, bilirubin 3.3, white blood cell 11, hemoglobin 11, platelets 108, INR 1.5, lactic acid 7 subsequently repeated 5.  Respiratory panel, flu COVID RSV negative.  Chest x-ray showed no acute abnormality.  CT head showed no acute abnormality.  CT of chest abdomen and pelvis showed no acute abnormality but did show some hepatic steatosis   Assessment & Plan:   Acute metabolic encephalopathy Severe dehydration Alcohol  withdrawal with DTs -Was found down by family on the floor, has been laying down for few hours, workup on admission significant for severe dehydration and significant electrolyte derangements.  And mild rhabdomyolysis. -questionable history of withdrawals versus seizure -  MRI brain and EEG nonacute, he denies any headache, he appears severely dehydrated with severe electrolyte abnormalities, being aggressively hydrated, electrolytes replaced, PT OT and speech.   - PT/OT input appreciated, will need SNF placement  - Much confused overnight appears to be an alcohol  withdrawals with DTs  Alcohol  abuse Alcohol   withdrawal -Drinks on average 4 shots per day pressure grandson at bedside -He does appear to be in significant withdrawal with DTs today, so started on stepdown protocol and scheduled  Librium -Continue with high-dose thiamine  and folic acid .   AKI Mild rhabdo -AKI on presentation, with elevated total CK, with evidence of urine dipstick for blood while no RBCs seen in hpf. - Resolved with IV fluids   Hypokalemia Hypomagnesemia Hypophosphatemia - Replaced, continue to monitor closely and replace as needed  SIRS Hypothermia Due to being exposed to elements and down for several hours, supportive care, TSH stable, B12 borderline low which will be replaced, no signs of infectious focus sipped possible mild UTI.  Finished 5 days of Rocephin  for UTI  Coffee Ground emesis;  Read by family, IV PPI, H&H is trending down which may be dilutional, no further evidence of coffee-ground emesis or melena or significant GI bleed. - Does report some difficulty swallowing, likely underlying severe esophagitis, will add Carafate , Magic mouthwash.  AKI Metabolic acidosis elevated anion gap Due to dehydration, baseline creatinine around 1, hydrate, renal function improving, monitor bladder scans, check CK.  Hypertension - Hold propranolol  due to soft blood pressure  A-fib - Hold beta-blocker to avoid future hypotension -Was not on anticoagulation prior to admission, and I will refrain from starting in the setting of his falls, thrombocytopenia.  Thrombocytopenia could be related to infectious process versus alcohol .  Monitor trending down initially, but this currently has stabilized.  Mild asymptomatic transaminitis. - Negative hepatitis panel - Ultrasound significant for hepatic steatosis - This is secondary to alcohol  abuse   Estimated body mass index is 22.24 kg/m as calculated from the following:   Height as of this encounter: 5' 10 (1.778 m).   Weight as of this encounter: 70.3 kg.   DVT  prophylaxis: SCD, holding on pharmacologic anticoagulation due to thrombocytopenia Code Status: Full code   Family Communication: Discussed with grandson at bedside at bedside this  morning evening Disposition Plan: To SNF, likely early next week once over his withdrawals Status is: Inpatient Remains inpatient appropriate because: Management of acute encephalopathy AKI    Consultants:  None  Procedures:  none  Antimicrobials:    Subjective:  Patient much confused overnight, requiring restraints, appears to be in withdrawals  Objective: Vitals:   07/11/24 0300 07/11/24 1138 07/11/24 1142 07/11/24 1252  BP: (!) 140/88 (!) 137/97 (!) 137/97 91/66  Pulse: 65 91 92 68  Resp:   20 17  Temp: (!) 97.3 F (36.3 C)  98 F (36.7 C)   TempSrc: Axillary  Oral   SpO2:    99%  Weight:      Height:        Intake/Output Summary (Last 24 hours) at 07/11/2024 1523 Last data filed at 07/11/2024 1200 Gross per 24 hour  Intake --  Output 1100 ml  Net -1100 ml   Filed Weights   07/06/24 1136  Weight: 70.3 kg    Examination:   Confused, restless with tremors Good air entry Irregular +ve B.Sounds, Abd Soft, No tenderness, No rebound - guarding or rigidity. No Cyanosis, Clubbing or edema, but he is having significant bruising all over his extremities     Data Review:   Patient Lines/Drains/Airways Status     Active Line/Drains/Airways     Name Placement date Placement time Site Days   Peripheral IV 07/07/24 18 G Anterior;Right Forearm 07/07/24  1122  Forearm  4   External Urinary Catheter 07/07/24  1307  --  4   Wound 07/07/24 1246 Other (Comment) Arm Lower;Posterior;Right 07/07/24  1246  Arm  4   Wound 07/07/24 1247 Hand Left;Posterior 07/07/24  1247  Hand  4   Wound 07/07/24 1250 Pressure Injury Buttocks Deep Tissue Pressure Injury - Purple or maroon localized area of discolored intact skin or blood-filled blister due to damage of underlying soft tissue from pressure and/or shear. 07/07/24  1250  Buttocks  4   Wound 07/07/24 1300 Traumatic Arm Left;Posterior;Upper 07/07/24  1300  Arm  4   Wound 07/07/24 1300 Traumatic Arm  Left;Lower;Posterior;Proximal 07/07/24  1300  Arm  4             Inpatient Medications  Scheduled Meds:  acyclovir  ointment  1 Application Topical Q3H   vitamin B-12  1,000 mcg Oral Daily   feeding supplement  237 mL Oral BID BM   folic acid   1 mg Oral Daily   LORazepam   0-4 mg Intravenous Q4H   Followed by   NOREEN ON 07/13/2024] LORazepam   0-4 mg Intravenous Q8H   LORazepam   2 mg Intravenous Once   magic mouthwash  10 mL Oral QID   multivitamin with minerals  1 tablet Oral Daily   pantoprazole  (PROTONIX ) IV  40 mg Intravenous Q12H   phosphorus  500 mg Oral BID   propranolol   20 mg Oral BID   sucralfate   1 g Oral TID WC & HS   Continuous Infusions:  potassium PHOSPHATE  IVPB (in mmol) 30 mmol (07/11/24 1336)   thiamine  (VITAMIN B1) injection Stopped (07/11/24 0830)   PRN Meds:.acetaminophen  **OR** acetaminophen , ipratropium-albuterol , LORazepam  **OR** LORazepam , metoprolol  tartrate, polyethylene glycol  DVT Prophylaxis   Recent Labs  Lab 07/06/24 1217 07/07/24 0349 07/08/24 0306 07/09/24 0334 07/10/24 0316 07/11/24 0527  WBC 11.0* 9.2 4.7 3.9*  3.0* 4.4  HGB 11.4* 10.6* 9.6* 9.9* 9.1* 9.4*  HCT 34.7* 29.7* 26.5* 27.2* 25.5* 25.8*  PLT 108* 101* 64* 59* 60* 76*  MCV 118.0* 111.2* 106.0* 106.7* 108.1* 106.2*  MCH 38.8* 39.7* 38.4* 38.8* 38.6* 38.7*  MCHC 32.9 35.7 36.2* 36.4* 35.7 36.4*  RDW 13.5 13.7 13.3 13.3 13.6 13.2  LYMPHSABS 0.7  --   --  0.7  --   --   MONOABS 0.6  --   --  0.3  --   --   EOSABS 0.0  --   --  0.0  --   --   BASOSABS 0.0  --   --  0.0  --   --     Recent Labs  Lab 07/06/24 1210 07/06/24 1210 07/06/24 1217 07/06/24 1408 07/06/24 2236 07/07/24 0349 07/07/24 0404 07/07/24 1330 07/08/24 0306 07/08/24 0609 07/09/24 0334 07/10/24 0316 07/11/24 0527  NA 134*  --  135  --   --  137  --   --  138  --  136 139 139  K 4.3  --  4.5  --   --  4.3  --   --  2.8*  --  4.0 3.5 3.3*  CL 102  --  94*  --   --  96*  --   --  99  --  102 103  106  CO2  --    < > 10*  --   --  15*  --   --  26  --  28 24 23   ANIONGAP  --    < > 32*  --   --  25*  --   --  13  --  7 11 10   GLUCOSE 82  --  84  --   --  111*  --   --  177*  --  127* 95 94  BUN 40*  --  37*  --   --  55*  --   --  38*  --  17 16 14   CREATININE 3.10*  --  2.74*  --   --  3.08*  --   --  1.67*  --  0.96 0.89 0.89  AST  --   --  92*  --   --  121*  --   --  155*  --  64*  --   --   ALT  --   --  46*  --   --  57*  --   --  68*  --  45*  --   --   ALKPHOS  --   --  63  --   --  51  --   --  52  --  54  --   --   BILITOT  --   --  3.3*  --   --  1.8*  --   --  1.0  --  1.3*  --   --   ALBUMIN  --   --  3.4*  --   --  3.1*  --   --  2.7*  --  2.7*  --   --   CRP  --   --   --   --   --   --   --   --   --  1.3*  --   --   --   PROCALCITON  --   --   --   --   --   --   --   --   --  1.29  --   --   --   LATICACIDVEN 7.3*  --   --  5.6* 3.1*  --  2.0*  --   --   --   --   --   --   INR  --   --  1.5*  --   --   --   --   --   --   --   --   --   --   TSH  --   --   --   --   --  5.340*  --   --   --   --   --   --   --   AMMONIA  --   --   --   --   --   --   --  37*  --   --   --   --   --   MG  --   --  1.7  --   --   --   --   --   --  1.3* 1.7 1.8 1.9  PHOS  --   --   --   --   --   --   --   --   --   --  <1.0* 2.3* 1.8*  CALCIUM  --    < > 8.0*  --   --  7.5*  --   --  7.3*  --  7.4* 7.3* 7.8*   < > = values in this interval not displayed.      Recent Labs  Lab 07/06/24 1210 07/06/24 1217 07/06/24 1217 07/06/24 1408 07/06/24 2236 07/07/24 0349 07/07/24 0404 07/07/24 1330 07/08/24 0306 07/08/24 0609 07/09/24 0334 07/10/24 0316 07/11/24 0527  CRP  --   --   --   --   --   --   --   --   --  1.3*  --   --   --   PROCALCITON  --   --   --   --   --   --   --   --   --  1.29  --   --   --   LATICACIDVEN 7.3*  --   --  5.6* 3.1*  --  2.0*  --   --   --   --   --   --   INR  --  1.5*  --   --   --   --   --   --   --   --   --   --   --   TSH  --   --   --    --   --  5.340*  --   --   --   --   --   --   --   AMMONIA  --   --   --   --   --   --   --  37*  --   --   --   --   --   MG  --  1.7  --   --   --   --   --   --   --  1.3* 1.7 1.8 1.9  CALCIUM  --  8.0*   < >  --   --  7.5*  --   --  7.3*  --  7.4* 7.3* 7.8*   < > = values in this  interval not displayed.    --------------------------------------------------------------------------------------------------------------- Lab Results  Component Value Date   CHOL 153 11/24/2023   HDL 52 11/24/2023   LDLCALC 78 11/24/2023   TRIG 114 11/24/2023   CHOLHDL 2.9 11/24/2023    Lab Results  Component Value Date   HGBA1C 4.6 (L) 11/23/2023   No results for input(s): TSH, T4TOTAL, FREET4, T3FREE, THYROIDAB in the last 72 hours.  No results for input(s): VITAMINB12, FOLATE, FERRITIN, TIBC, IRON, RETICCTPCT in the last 72 hours.  ------------------------------------------------------------------------------------------------------------------ Cardiac Enzymes No results for input(s): CKMB, TROPONINI, MYOGLOBIN in the last 168 hours.  Invalid input(s): CK  Micro Results Recent Results (from the past 240 hours)  Resp panel by RT-PCR (RSV, Flu A&B, Covid) Anterior Nasal Swab     Status: None   Collection Time: 07/06/24 11:46 AM   Specimen: Anterior Nasal Swab  Result Value Ref Range Status   SARS Coronavirus 2 by RT PCR NEGATIVE NEGATIVE Final   Influenza A by PCR NEGATIVE NEGATIVE Final   Influenza B by PCR NEGATIVE NEGATIVE Final    Comment: (NOTE) The Xpert Xpress SARS-CoV-2/FLU/RSV plus assay is intended as an aid in the diagnosis of influenza from Nasopharyngeal swab specimens and should not be used as a sole basis for treatment. Nasal washings and aspirates are unacceptable for Xpert Xpress SARS-CoV-2/FLU/RSV testing.  Fact Sheet for Patients: bloggercourse.com  Fact Sheet for Healthcare  Providers: seriousbroker.it  This test is not yet approved or cleared by the [place]  FDA and has been authorized for detection and/or diagnosis of SARS-CoV-2 by FDA under an Emergency Use Authorization (EUA). This EUA will remain in effect (meaning this test can be used) for the duration of the COVID-19 declaration under Section 564(b)(1) of the Act, 21 U.S.C. section 360bbb-3(b)(1), unless the authorization is terminated or revoked.     Resp Syncytial Virus by PCR NEGATIVE NEGATIVE Final    Comment: (NOTE) Fact Sheet for Patients: bloggercourse.com  Fact Sheet for Healthcare Providers: seriousbroker.it  This test is not yet approved or cleared by the [place]  FDA and has been authorized for detection and/or diagnosis of SARS-CoV-2 by FDA under an Emergency Use Authorization (EUA). This EUA will remain in effect (meaning this test can be used) for the duration of the COVID-19 declaration under Section 564(b)(1) of the Act, 21 U.S.C. section 360bbb-3(b)(1), unless the authorization is terminated or revoked.  Performed at [facility], [address], [place], [place] [address]   Blood Culture (routine x 2)     Status: None   Collection Time: 07/06/24 11:51 AM   Specimen: BLOOD RIGHT ARM  Result Value Ref Range Status   Specimen Description BLOOD RIGHT ARM  Final   Special Requests   Final    BOTTLES DRAWN AEROBIC AND ANAEROBIC Blood Culture adequate volume   Culture   Final    NO GROWTH 5 DAYS Performed at [facility], [address] [place], [place] [address]    Report Status 07/11/2024 FINAL  Final  Blood Culture (routine x 2)     Status: None   Collection Time: 07/06/24 12:18 PM   Specimen: BLOOD  Result Value Ref Range Status   Specimen Description BLOOD  Final   Special Requests NONE  Final   Culture   Final    NO GROWTH 5 DAYS Performed at [facility],  [address], [place], [place] [address]    Report Status 07/11/2024 FINAL  Final  MRSA Next Gen by  PCR, Nasal     Status: None   Collection Time: 07/07/24  5:27 PM   Specimen: Nasal Mucosa; Nasal Swab  Result Value Ref Range Status   MRSA by PCR Next Gen NOT DETECTED NOT DETECTED Final    Comment: (NOTE) The GeneXpert MRSA Assay (FDA approved for NASAL specimens only), is one component of a comprehensive MRSA colonization surveillance program. It is not intended to diagnose MRSA infection nor to guide or monitor treatment for MRSA infections. Test performance is not FDA approved in patients less than 46 years old. Performed at [facility], [address] [place], [place] [address]   Respiratory (~20 pathogens) panel by PCR     Status: None   Collection Time: 07/08/24 10:13 AM   Specimen: Nasopharyngeal Swab; Respiratory  Result Value Ref Range Status   Adenovirus NOT DETECTED NOT DETECTED Final   Coronavirus 229E NOT DETECTED NOT DETECTED Final    Comment: (NOTE) The Coronavirus on the Respiratory Panel, DOES NOT test for the novel  Coronavirus (2019 nCoV)    Coronavirus HKU1 NOT DETECTED NOT DETECTED Final   Coronavirus NL63 NOT DETECTED NOT DETECTED Final   Coronavirus OC43 NOT DETECTED NOT DETECTED Final   Metapneumovirus NOT DETECTED NOT DETECTED Final   Rhinovirus / Enterovirus NOT DETECTED NOT DETECTED Final   Influenza A NOT DETECTED NOT DETECTED Final   Influenza B NOT DETECTED NOT DETECTED Final   Parainfluenza Virus 1 NOT DETECTED NOT DETECTED Final   Parainfluenza Virus 2 NOT DETECTED NOT DETECTED Final   Parainfluenza Virus 3 NOT DETECTED NOT DETECTED Final   Parainfluenza Virus 4 NOT DETECTED NOT DETECTED Final   Respiratory Syncytial Virus NOT DETECTED NOT DETECTED Final   Bordetella pertussis NOT DETECTED NOT DETECTED Final   Bordetella Parapertussis NOT DETECTED NOT DETECTED Final   Chlamydophila pneumoniae NOT DETECTED NOT DETECTED Final   Mycoplasma  pneumoniae NOT DETECTED NOT DETECTED Final    Comment: Performed at [facility], [address] [place], [place] [address]    Radiology Reports  No results found.     Signature  -   Brayton Lye M.D on 07/11/2024 at 3:23 PM   -  To page go to [email or web address]       07/11/2024, 3:23 PM   "

## 2024-07-12 DIAGNOSIS — G9340 Encephalopathy, unspecified: Secondary | ICD-10-CM | POA: Diagnosis not present

## 2024-07-12 LAB — CBC
HCT: 25.3 % — ABNORMAL LOW (ref 39.0–52.0)
Hemoglobin: 9 g/dL — ABNORMAL LOW (ref 13.0–17.0)
MCH: 37.8 pg — ABNORMAL HIGH (ref 26.0–34.0)
MCHC: 35.6 g/dL (ref 30.0–36.0)
MCV: 106.3 fL — ABNORMAL HIGH (ref 80.0–100.0)
Platelets: 85 10*3/uL — ABNORMAL LOW (ref 150–400)
RBC: 2.38 MIL/uL — ABNORMAL LOW (ref 4.22–5.81)
RDW: 13.4 % (ref 11.5–15.5)
WBC: 3.5 10*3/uL — ABNORMAL LOW (ref 4.0–10.5)
nRBC: 0.6 % — ABNORMAL HIGH (ref 0.0–0.2)

## 2024-07-12 LAB — BASIC METABOLIC PANEL WITH GFR
Anion gap: 10 (ref 5–15)
BUN: 10 mg/dL (ref 8–23)
CO2: 23 mmol/L (ref 22–32)
Calcium: 7.6 mg/dL — ABNORMAL LOW (ref 8.9–10.3)
Chloride: 105 mmol/L (ref 98–111)
Creatinine, Ser: 0.76 mg/dL (ref 0.61–1.24)
GFR, Estimated: >60 mL/min
Glucose, Bld: 86 mg/dL (ref 70–99)
Potassium: 3.3 mmol/L — ABNORMAL LOW (ref 3.5–5.1)
Sodium: 138 mmol/L (ref 135–145)

## 2024-07-12 LAB — PHOSPHORUS: Phosphorus: 2.8 mg/dL (ref 2.5–4.6)

## 2024-07-12 LAB — MAGNESIUM: Magnesium: 1.5 mg/dL — ABNORMAL LOW (ref 1.7–2.4)

## 2024-07-12 MED ORDER — MAGNESIUM SULFATE 4 GM/100ML IV SOLN
4.0000 g | Freq: Once | INTRAVENOUS | Status: AC
  Administered 2024-07-12: 4 g via INTRAVENOUS
  Filled 2024-07-12: qty 100

## 2024-07-12 MED ORDER — POTASSIUM CHLORIDE CRYS ER 20 MEQ PO TBCR
40.0000 meq | EXTENDED_RELEASE_TABLET | Freq: Four times a day (QID) | ORAL | Status: AC
  Administered 2024-07-12 (×2): 40 meq via ORAL
  Filled 2024-07-12 (×2): qty 2

## 2024-07-12 NOTE — Progress Notes (Signed)
 [facility] night cross cover note:  I was notified by RN that this patient is altered, agitated, pulling at their leads/lines, repeated attempting to get out of bed independently, with these behaviors refractory to attempts at verbal redirection.  In the setting of associated interference with ongoing medical treatment posing potential harm to themself, I have placed order for soft bilateral wrist restraints .   Eva Pore, DO Hospitalist

## 2024-07-12 NOTE — Plan of Care (Signed)
" °  Problem: Clinical Measurements: Goal: Diagnostic test results will improve Outcome: Progressing   Problem: Respiratory: Goal: Ability to maintain adequate ventilation will improve Outcome: Progressing   Problem: Coping: Goal: Ability to adjust to condition or change in health will improve Outcome: Progressing   "

## 2024-07-12 NOTE — Progress Notes (Signed)
 " PROGRESS NOTE    Gabriel Everett  FMW:[id number] DOB: Mar 27, 1946 DOA: 07/06/2024 PCP: [name], FNP   Brief Narrative:  79 year old with past medical history significant for hypertension, A-fib, alcohol  use presented with altered mental status and weakness.  Patient was found down on the floor by family.  EMS was contacted.  Patient was noted to be confused.  Patient was recently treated for the flu a week ago.  Patient was noted to have multiple skin tears.  On EMS evaluation initial blood pressure was in the 70s.  Improved with IV fluids.  Patient and route had seizure lasting 1 minute with tonic-clonic activity.  Evaluation in the ED presented with a creatinine of 2.7 baseline of 1, bilirubin 3.3, white blood cell 11, hemoglobin 11, platelets 108, INR 1.5, lactic acid 7 subsequently repeated 5.  Respiratory panel, flu COVID RSV negative.  Chest x-ray showed no acute abnormality.  CT head showed no acute abnormality.  CT of chest abdomen and pelvis showed no acute abnormality but did show some hepatic steatosis   Assessment & Plan:   Acute metabolic encephalopathy Severe dehydration Alcohol  withdrawal with DTs -Was found down by family on the floor, has been laying down for few hours, workup on admission significant for severe dehydration and significant electrolyte derangements.  And mild rhabdomyolysis. -questionable history of withdrawals versus seizure -  MRI brain and EEG nonacute, he denies any headache, he appears severely dehydrated with severe electrolyte abnormalities, being aggressively hydrated, electrolytes replaced, PT OT and speech.   - PT/OT input appreciated, will need SNF placement  - Patient appears to be in acute withdrawals currently, please see discussion below   Alcohol  abuse Alcohol   withdrawal -Drinks on average 4 shots per day pressure grandson at bedside - New CIWA protocol given he developed DTs, as well he has hospital delirium.  -Somnolent this  morning, so I will discontinue his Librium  and keep only on Ativan  CIWA protocol he does appear to be in significant withdrawal with DTs today, so started on stepdown protocol and scheduled Librium  -Continue with high-dose thiamine  and folic acid .   AKI Mild rhabdo -AKI on presentation, with elevated total CK, with evidence of urine dipstick for blood while no RBCs seen in hpf. - Resolved with IV fluids   Hypokalemia Hypomagnesemia Hypophosphatemia - Continue to monitor and replace as needed  SIRS Hypothermia Due to being exposed to elements and down for several hours, supportive care, TSH stable, B12 borderline low which will be replaced, no signs of infectious focus sipped possible mild UTI.  Finished 5 days of Rocephin  for UTI  Coffee Ground emesis;  Read by family, IV PPI, H&H is trending down which may be dilutional, no further evidence of coffee-ground emesis or melena or significant GI bleed. - Does report some difficulty swallowing, likely underlying severe esophagitis, will add Carafate , Magic mouthwash.  AKI Metabolic acidosis elevated anion gap Due to dehydration, baseline creatinine around 1, hydrate, renal function improving, monitor bladder scans, check CK.  Hypertension - Hold propranolol  due to soft blood pressure  A-fib - Hold beta-blocker to avoid future hypotension -Was not on anticoagulation prior to admission, and I will refrain from starting in the setting of his falls, thrombocytopenia.  Thrombocytopenia could be related to infectious process versus alcohol .  Monitor trending down initially, but this currently has stabilized.  Mild asymptomatic transaminitis. - Negative hepatitis panel - Ultrasound significant for hepatic steatosis - This is secondary to alcohol  abuse   Estimated body mass index  is 22.24 kg/m as calculated from the following:   Height as of this encounter: 5' 10 (1.778 m).   Weight as of this encounter: 70.3 kg.   DVT  prophylaxis: SCD, holding on pharmacologic anticoagulation due to thrombocytopenia Code Status: Full code   Family Communication: Discussed with grandson at bedside at bedside this morning as well Disposition Plan: To SNF, likely early next week once over his withdrawals Status is: Inpatient Remains inpatient appropriate because: Management of acute encephalopathy AKI    Consultants:  None  Procedures:  none  Antimicrobials:    Subjective:  Patient this morning somnolent cannot provide any complaints or give any history, but he was confused overnight  Objective: Vitals:   07/12/24 0400 07/12/24 0800 07/12/24 1200 07/12/24 1300  BP: (!) 144/54   (!) 117/53  Pulse: (!) 56   (!) 57  Resp: 12   18  Temp:  98.3 F (36.8 C) 98.2 F (36.8 C)   TempSrc:  Axillary Axillary   SpO2: 100%   100%  Weight:      Height:        Intake/Output Summary (Last 24 hours) at 07/12/2024 1306 Last data filed at 07/12/2024 1132 Gross per 24 hour  Intake 541.67 ml  Output 1050 ml  Net -508.33 ml   Filed Weights   07/06/24 1136  Weight: 70.3 kg    Examination:  Somnolent this morning, but wakes up to go back to sleep, no apparent distress Good air entry Irregular +ve B.Sounds, Abd Soft, No tenderness, No rebound - guarding or rigidity. No Cyanosis, Clubbing or edema, but he is having significant bruising all over his extremities     Data Review:   Patient Lines/Drains/Airways Status     Active Line/Drains/Airways     Name Placement date Placement time Site Days   Peripheral IV 07/11/24 Anterior;Left;Proximal Forearm 07/11/24  1630  Forearm  1   External Urinary Catheter 07/07/24  1307  --  5   Wound 07/07/24 1246 Other (Comment) Arm Lower;Posterior;Right 07/07/24  1246  Arm  5   Wound 07/07/24 1247 Hand Left;Posterior 07/07/24  1247  Hand  5   Wound 07/07/24 1250 Pressure Injury Buttocks Deep Tissue Pressure Injury - Purple or maroon localized area of discolored intact skin or  blood-filled blister due to damage of underlying soft tissue from pressure and/or shear. 07/07/24  1250  Buttocks  5   Wound 07/07/24 1300 Traumatic Arm Left;Posterior;Upper 07/07/24  1300  Arm  5   Wound 07/07/24 1300 Traumatic Arm Left;Lower;Posterior;Proximal 07/07/24  1300  Arm  5             Inpatient Medications  Scheduled Meds:  vitamin B-12  1,000 mcg Oral Daily   feeding supplement  237 mL Oral BID BM   folic acid   1 mg Oral Daily   LORazepam   0-4 mg Intravenous Q4H   Followed by   NOREEN ON 07/13/2024] LORazepam   0-4 mg Intravenous Q8H   LORazepam   2 mg Intravenous Once   magic mouthwash  10 mL Oral QID   multivitamin with minerals  1 tablet Oral Daily   pantoprazole  (PROTONIX ) IV  40 mg Intravenous Q12H   phosphorus  500 mg Oral BID   potassium chloride   40 mEq Oral Q6H   propranolol   20 mg Oral BID   sucralfate   1 g Oral TID WC & HS   Continuous Infusions:  lactated ringers  75 mL/hr at 07/12/24 0517   thiamine  (VITAMIN B1) injection  500 mg (07/12/24 0003)   PRN Meds:.acetaminophen  **OR** acetaminophen , ipratropium-albuterol , LORazepam  **OR** LORazepam , metoprolol  tartrate, polyethylene glycol  DVT Prophylaxis   Recent Labs  Lab 07/06/24 1217 07/07/24 0349 07/08/24 0306 07/09/24 0334 07/10/24 0316 07/11/24 0527 07/12/24 0522  WBC 11.0*   < > 4.7 3.9* 3.0* 4.4 3.5*  HGB 11.4*   < > 9.6* 9.9* 9.1* 9.4* 9.0*  HCT 34.7*   < > 26.5* 27.2* 25.5* 25.8* 25.3*  PLT 108*   < > 64* 59* 60* 76* 85*  MCV 118.0*   < > 106.0* 106.7* 108.1* 106.2* 106.3*  MCH 38.8*   < > 38.4* 38.8* 38.6* 38.7* 37.8*  MCHC 32.9   < > 36.2* 36.4* 35.7 36.4* 35.6  RDW 13.5   < > 13.3 13.3 13.6 13.2 13.4  LYMPHSABS 0.7  --   --  0.7  --   --   --   MONOABS 0.6  --   --  0.3  --   --   --   EOSABS 0.0  --   --  0.0  --   --   --   BASOSABS 0.0  --   --  0.0  --   --   --    < > = values in this interval not displayed.    Recent Labs  Lab 07/06/24 1210 07/06/24 1210 07/06/24 1217  07/06/24 1408 07/06/24 2236 07/07/24 0349 07/07/24 0404 07/07/24 1330 07/08/24 0306 07/08/24 0609 07/09/24 0334 07/10/24 0316 07/11/24 0527 07/12/24 0522  NA 134*  --  135  --   --  137  --   --  138  --  136 139 139 138  K 4.3  --  4.5  --   --  4.3  --   --  2.8*  --  4.0 3.5 3.3* 3.3*  CL 102  --  94*  --   --  96*  --   --  99  --  102 103 106 105  CO2  --    < > 10*  --   --  15*  --   --  26  --  28 24 23 23   ANIONGAP  --    < > 32*  --   --  25*  --   --  13  --  7 11 10 10   GLUCOSE 82  --  84  --   --  111*  --   --  177*  --  127* 95 94 86  BUN 40*  --  37*  --   --  55*  --   --  38*  --  17 16 14 10   CREATININE 3.10*  --  2.74*  --   --  3.08*  --   --  1.67*  --  0.96 0.89 0.89 0.76  AST  --   --  92*  --   --  121*  --   --  155*  --  64*  --   --   --   ALT  --   --  46*  --   --  57*  --   --  68*  --  45*  --   --   --   ALKPHOS  --   --  63  --   --  51  --   --  52  --  54  --   --   --   BILITOT  --   --  3.3*  --   --  1.8*  --   --  1.0  --  1.3*  --   --   --   ALBUMIN  --   --  3.4*  --   --  3.1*  --   --  2.7*  --  2.7*  --   --   --   CRP  --   --   --   --   --   --   --   --   --  1.3*  --   --   --   --   PROCALCITON  --   --   --   --   --   --   --   --   --  1.29  --   --   --   --   LATICACIDVEN 7.3*  --   --  5.6* 3.1*  --  2.0*  --   --   --   --   --   --   --   INR  --   --  1.5*  --   --   --   --   --   --   --   --   --   --   --   TSH  --   --   --   --   --  5.340*  --   --   --   --   --   --   --   --   AMMONIA  --   --   --   --   --   --   --  37*  --   --   --   --   --   --   MG  --    < > 1.7  --   --   --   --   --   --  1.3* 1.7 1.8 1.9 1.5*  PHOS  --   --   --   --   --   --   --   --   --   --  <1.0* 2.3* 1.8* 2.8  CALCIUM  --    < > 8.0*  --   --  7.5*  --   --  7.3*  --  7.4* 7.3* 7.8* 7.6*   < > = values in this interval not displayed.      Recent Labs  Lab 07/06/24 1210 07/06/24 1217 07/06/24 1217 07/06/24 1408  07/06/24 2236 07/07/24 0349 07/07/24 0404 07/07/24 1330 07/08/24 0306 07/08/24 0609 07/09/24 0334 07/10/24 0316 07/11/24 0527 07/12/24 0522  CRP  --   --   --   --   --   --   --   --   --  1.3*  --   --   --   --   PROCALCITON  --   --   --   --   --   --   --   --   --  1.29  --   --   --   --   LATICACIDVEN 7.3*  --   --  5.6* 3.1*  --  2.0*  --   --   --   --   --   --   --   INR  --  1.5*  --   --   --   --   --   --   --   --   --   --   --   --  TSH  --   --   --   --   --  5.340*  --   --   --   --   --   --   --   --   AMMONIA  --   --   --   --   --   --   --  37*  --   --   --   --   --   --   MG  --  1.7   < >  --   --   --   --   --   --  1.3* 1.7 1.8 1.9 1.5*  CALCIUM  --  8.0*   < >  --   --  7.5*  --   --  7.3*  --  7.4* 7.3* 7.8* 7.6*   < > = values in this interval not displayed.    --------------------------------------------------------------------------------------------------------------- Lab Results  Component Value Date   CHOL 153 11/24/2023   HDL 52 11/24/2023   LDLCALC 78 11/24/2023   TRIG 114 11/24/2023   CHOLHDL 2.9 11/24/2023    Lab Results  Component Value Date   HGBA1C 4.6 (L) 11/23/2023   No results for input(s): TSH, T4TOTAL, FREET4, T3FREE, THYROIDAB in the last 72 hours.  No results for input(s): VITAMINB12, FOLATE, FERRITIN, TIBC, IRON, RETICCTPCT in the last 72 hours.  ------------------------------------------------------------------------------------------------------------------ Cardiac Enzymes No results for input(s): CKMB, TROPONINI, MYOGLOBIN in the last 168 hours.  Invalid input(s): CK  Micro Results Recent Results (from the past 240 hours)  Resp panel by RT-PCR (RSV, Flu A&B, Covid) Anterior Nasal Swab     Status: None   Collection Time: 07/06/24 11:46 AM   Specimen: Anterior Nasal Swab  Result Value Ref Range Status   SARS Coronavirus 2 by RT PCR NEGATIVE NEGATIVE Final   Influenza A by  PCR NEGATIVE NEGATIVE Final   Influenza B by PCR NEGATIVE NEGATIVE Final    Comment: (NOTE) The Xpert Xpress SARS-CoV-2/FLU/RSV plus assay is intended as an aid in the diagnosis of influenza from Nasopharyngeal swab specimens and should not be used as a sole basis for treatment. Nasal washings and aspirates are unacceptable for Xpert Xpress SARS-CoV-2/FLU/RSV testing.  Fact Sheet for Patients: bloggercourse.com  Fact Sheet for Healthcare Providers: seriousbroker.it  This test is not yet approved or cleared by the [place]  FDA and has been authorized for detection and/or diagnosis of SARS-CoV-2 by FDA under an Emergency Use Authorization (EUA). This EUA will remain in effect (meaning this test can be used) for the duration of the COVID-19 declaration under Section 564(b)(1) of the Act, 21 U.S.C. section 360bbb-3(b)(1), unless the authorization is terminated or revoked.     Resp Syncytial Virus by PCR NEGATIVE NEGATIVE Final    Comment: (NOTE) Fact Sheet for Patients: bloggercourse.com  Fact Sheet for Healthcare Providers: seriousbroker.it  This test is not yet approved or cleared by the [place]  FDA and has been authorized for detection and/or diagnosis of SARS-CoV-2 by FDA under an Emergency Use Authorization (EUA). This EUA will remain in effect (meaning this test can be used) for the duration of the COVID-19 declaration under Section 564(b)(1) of the Act, 21 U.S.C. section 360bbb-3(b)(1), unless the authorization is terminated or revoked.  Performed at [facility], [address], [place], [place] [address]   Blood Culture (routine x 2)     Status: None   Collection Time: 07/06/24 11:51 AM   Specimen: BLOOD  RIGHT ARM  Result Value Ref Range Status   Specimen Description BLOOD RIGHT ARM  Final   Special Requests   Final    BOTTLES DRAWN AEROBIC AND  ANAEROBIC Blood Culture adequate volume   Culture   Final    NO GROWTH 5 DAYS Performed at [facility], [address], [place], [place] [address]    Report Status 07/11/2024 FINAL  Final  Blood Culture (routine x 2)     Status: None   Collection Time: 07/06/24 12:18 PM   Specimen: BLOOD  Result Value Ref Range Status   Specimen Description BLOOD  Final   Special Requests NONE  Final   Culture   Final    NO GROWTH 5 DAYS Performed at [facility], [address] [place], [place] [address]    Report Status 07/11/2024 FINAL  Final  MRSA Next Gen by PCR, Nasal     Status: None   Collection Time: 07/07/24  5:27 PM   Specimen: Nasal Mucosa; Nasal Swab  Result Value Ref Range Status   MRSA by PCR Next Gen NOT DETECTED NOT DETECTED Final    Comment: (NOTE) The GeneXpert MRSA Assay (FDA approved for NASAL specimens only), is one component of a comprehensive MRSA colonization surveillance program. It is not intended to diagnose MRSA infection nor to guide or monitor treatment for MRSA infections. Test performance is not FDA approved in patients less than 5 years old. Performed at [facility], [address], [place], [place] [address]   Respiratory (~20 pathogens) panel by PCR     Status: None   Collection Time: 07/08/24 10:13 AM   Specimen: Nasopharyngeal Swab; Respiratory  Result Value Ref Range Status   Adenovirus NOT DETECTED NOT DETECTED Final   Coronavirus 229E NOT DETECTED NOT DETECTED Final    Comment: (NOTE) The Coronavirus on the Respiratory Panel, DOES NOT test for the novel  Coronavirus (2019 nCoV)    Coronavirus HKU1 NOT DETECTED NOT DETECTED Final   Coronavirus NL63 NOT DETECTED NOT DETECTED Final   Coronavirus OC43 NOT DETECTED NOT DETECTED Final   Metapneumovirus NOT DETECTED NOT DETECTED Final   Rhinovirus / Enterovirus NOT DETECTED NOT DETECTED Final   Influenza A NOT DETECTED NOT DETECTED Final   Influenza B NOT DETECTED NOT DETECTED  Final   Parainfluenza Virus 1 NOT DETECTED NOT DETECTED Final   Parainfluenza Virus 2 NOT DETECTED NOT DETECTED Final   Parainfluenza Virus 3 NOT DETECTED NOT DETECTED Final   Parainfluenza Virus 4 NOT DETECTED NOT DETECTED Final   Respiratory Syncytial Virus NOT DETECTED NOT DETECTED Final   Bordetella pertussis NOT DETECTED NOT DETECTED Final   Bordetella Parapertussis NOT DETECTED NOT DETECTED Final   Chlamydophila pneumoniae NOT DETECTED NOT DETECTED Final   Mycoplasma pneumoniae NOT DETECTED NOT DETECTED Final    Comment: Performed at [facility], [address], [place], [place] [address]    Radiology Reports  No results found.     Signature  -   Brayton Lye M.D on 07/12/2024 at 1:06 PM   -  To page go to [email or web address]       07/12/2024, 1:06 PM   "

## 2024-07-12 NOTE — Plan of Care (Signed)
" °  Problem: Clinical Measurements: Goal: Diagnostic test results will improve Outcome: Progressing   Problem: Safety: Goal: Non-violent Restraint(s) Outcome: Progressing   Problem: Education: Goal: Expressions of having a comfortable level of knowledge regarding the disease process will increase Outcome: Progressing   Problem: Clinical Measurements: Goal: Diagnostic test results will improve Outcome: Progressing   "

## 2024-07-12 NOTE — Plan of Care (Signed)
" °  Problem: Fluid Volume: Goal: Hemodynamic stability will improve Outcome: Progressing   Problem: Safety: Goal: Non-violent Restraint(s) Outcome: Progressing   Problem: Medication: Goal: Risk for medication side effects will decrease Outcome: Progressing   "

## 2024-07-13 DIAGNOSIS — G9340 Encephalopathy, unspecified: Secondary | ICD-10-CM | POA: Diagnosis not present

## 2024-07-13 LAB — CBC
HCT: 25.2 % — ABNORMAL LOW (ref 39.0–52.0)
Hemoglobin: 8.8 g/dL — ABNORMAL LOW (ref 13.0–17.0)
MCH: 37.9 pg — ABNORMAL HIGH (ref 26.0–34.0)
MCHC: 34.9 g/dL (ref 30.0–36.0)
MCV: 108.6 fL — ABNORMAL HIGH (ref 80.0–100.0)
Platelets: 118 10*3/uL — ABNORMAL LOW (ref 150–400)
RBC: 2.32 MIL/uL — ABNORMAL LOW (ref 4.22–5.81)
RDW: 13.9 % (ref 11.5–15.5)
WBC: 4.5 10*3/uL (ref 4.0–10.5)
nRBC: 0 % (ref 0.0–0.2)

## 2024-07-13 LAB — BASIC METABOLIC PANEL WITH GFR
Anion gap: 8 (ref 5–15)
BUN: 11 mg/dL (ref 8–23)
CO2: 21 mmol/L — ABNORMAL LOW (ref 22–32)
Calcium: 7.4 mg/dL — ABNORMAL LOW (ref 8.9–10.3)
Chloride: 107 mmol/L (ref 98–111)
Creatinine, Ser: 0.86 mg/dL (ref 0.61–1.24)
GFR, Estimated: >60 mL/min
Glucose, Bld: 95 mg/dL (ref 70–99)
Potassium: 4 mmol/L (ref 3.5–5.1)
Sodium: 136 mmol/L (ref 135–145)

## 2024-07-13 LAB — PHOSPHORUS: Phosphorus: 2.5 mg/dL (ref 2.5–4.6)

## 2024-07-13 LAB — MAGNESIUM: Magnesium: 2 mg/dL (ref 1.7–2.4)

## 2024-07-13 MED ORDER — K PHOS MONO-SOD PHOS DI & MONO 155-852-130 MG PO TABS
500.0000 mg | ORAL_TABLET | Freq: Two times a day (BID) | ORAL | Status: AC
  Administered 2024-07-13 – 2024-07-15 (×4): 500 mg via ORAL
  Filled 2024-07-13 (×6): qty 2

## 2024-07-13 MED ORDER — CHLORDIAZEPOXIDE HCL 5 MG PO CAPS
5.0000 mg | ORAL_CAPSULE | Freq: Three times a day (TID) | ORAL | Status: AC
  Administered 2024-07-13 (×2): 5 mg via ORAL
  Filled 2024-07-13 (×2): qty 1

## 2024-07-13 NOTE — Progress Notes (Signed)
 " PROGRESS NOTE    Gabriel Everett  FMW:[id number] DOB: March 17, 1946 DOA: 07/06/2024 PCP: [name], FNP   Brief Narrative:  79 year old with past medical history significant for hypertension, A-fib, alcohol  use presented with altered mental status and weakness.  Patient was found down on the floor by family.  EMS was contacted.  Patient was noted to be confused.  Patient was recently treated for the flu a week ago.  Patient was noted to have multiple skin tears.  On EMS evaluation initial blood pressure was in the 70s.  Improved with IV fluids.  Patient and route had seizure lasting 1 minute with tonic-clonic activity.  Evaluation in the ED presented with a creatinine of 2.7 baseline of 1, bilirubin 3.3, white blood cell 11, hemoglobin 11, platelets 108, INR 1.5, lactic acid 7 subsequently repeated 5.  Respiratory panel, flu COVID RSV negative.  Chest x-ray showed no acute abnormality.  CT head showed no acute abnormality.  CT of chest abdomen and pelvis showed no acute abnormality but did show some hepatic steatosis   Assessment & Plan:   Acute metabolic encephalopathy Severe dehydration Alcohol  withdrawal with DTs -Was found down by family on the floor, has been laying down for few hours, workup on admission significant for severe dehydration and significant electrolyte derangements.  And mild rhabdomyolysis. -questionable history of withdrawals versus seizure -  MRI brain and EEG nonacute, he denies any headache, he appears severely dehydrated with severe electrolyte abnormalities, being aggressively hydrated, electrolytes replaced, PT OT and speech.   - PT/OT input appreciated, will need SNF placement  - Was in acute withdrawals, this has much improved.  Alcohol  abuse Alcohol   withdrawal -Drinks on average 4 shots per day pressure grandson at bedside - New CIWA protocol given he developed DTs, as well he has hospital delirium.  -Somnolent this morning, so I will discontinue his  Librium  and keep only on Ativan  CIWA protocol -Stopped his Librium  giving increased somnolent, currently much improved just on CIWA protocol. -Continue with high-dose thiamine  and folic acid .   AKI Mild rhabdo -AKI on presentation, with elevated total CK, with evidence of urine dipstick for blood while no RBCs seen in hpf. - Resolved with IV fluids   Hypokalemia Hypomagnesemia Hypophosphatemia - Continue to monitor and replace as needed  SIRS Hypothermia Due to being exposed to elements and down for several hours, supportive care, TSH stable, B12 borderline low which will be replaced, no signs of infectious focus sipped possible mild UTI.  Finished 5 days of Rocephin  for UTI  Coffee Ground emesis;  Severe esophagitis Odynophagia Dysphagia -Noticed by by family, IV PPI, H&H is trending down which may be dilutional, no further evidence of coffee-ground emesis or melena or significant GI bleed. - Does report some difficulty swallowing, likely underlying severe esophagitis, will add Carafate , Magic mouthwash.  This morning he reports difficulty and pain and swallowing much improved on current regimen -  for MBS tomorrow  AKI Metabolic acidosis elevated anion gap Due to dehydration, baseline creatinine around 1, hydrate, renal function improving, monitor bladder scans, check CK.  Hypertension - Hold propranolol  due to soft blood pressure  A-fib - Hold beta-blocker to avoid future hypotension -Was not on anticoagulation prior to admission, and I will refrain from starting in the setting of his falls, thrombocytopenia.  Thrombocytopenia could be related to infectious process versus alcohol .  Monitor trending down initially, but this currently has stabilized.  Mild asymptomatic transaminitis. - Negative hepatitis panel - Ultrasound significant for hepatic  steatosis - This is secondary to alcohol  abuse   Estimated body mass index is 22.24 kg/m as calculated from the  following:   Height as of this encounter: 5' 10 (1.778 m).   Weight as of this encounter: 70.3 kg.   DVT prophylaxis: SCD, holding on pharmacologic anticoagulation due to thrombocytopenia Code Status: Full code   Family Communication: Discussed with grandson at bedside/24, none at bedside today . Disposition Plan: To SNF, likely early next week once over his withdrawals Status is: Inpatient Remains inpatient appropriate because: Management of acute encephalopathy AKI    Consultants:  None  Procedures:  none  Antimicrobials:    Subjective:  More awake today, denies any complaints, he reports his pain with swallowing has improved, finish 40% of his breakfast as discussed with staff  Objective: Vitals:   07/13/24 0000 07/13/24 0800 07/13/24 1119 07/13/24 1159  BP: 116/72  (!) 96/47   Pulse: 62  66   Resp: 20     Temp:  98.1 F (36.7 C)  98.2 F (36.8 C)  TempSrc:  Oral  Oral  SpO2: 100%     Weight:      Height:        Intake/Output Summary (Last 24 hours) at 07/13/2024 1402 Last data filed at 07/13/2024 0815 Gross per 24 hour  Intake --  Output 950 ml  Net -950 ml   Filed Weights   07/06/24 1136  Weight: 70.3 kg    Examination:  Weight, alert, more appropriate today, no apparent distress, pleasant and cooperative  Good air entry Irregular +ve B.Sounds, Abd Soft, No tenderness, No rebound - guarding or rigidity. No Cyanosis, Clubbing or edema, but he is having significant bruising all over his extremities     Data Review:   Patient Lines/Drains/Airways Status     Active Line/Drains/Airways     Name Placement date Placement time Site Days   Peripheral IV 07/11/24 Anterior;Left;Proximal Forearm 07/11/24  1630  Forearm  2   External Urinary Catheter 07/07/24  1307  --  6   Wound 07/07/24 1246 Other (Comment) Arm Lower;Posterior;Right 07/07/24  1246  Arm  6   Wound 07/07/24 1247 Hand Left;Posterior 07/07/24  1247  Hand  6   Wound 07/07/24 1250  Pressure Injury Buttocks Deep Tissue Pressure Injury - Purple or maroon localized area of discolored intact skin or blood-filled blister due to damage of underlying soft tissue from pressure and/or shear. 07/07/24  1250  Buttocks  6   Wound 07/07/24 1300 Traumatic Arm Left;Posterior;Upper 07/07/24  1300  Arm  6   Wound 07/07/24 1300 Traumatic Arm Left;Lower;Posterior;Proximal 07/07/24  1300  Arm  6             Inpatient Medications  Scheduled Meds:  vitamin B-12  1,000 mcg Oral Daily   feeding supplement  237 mL Oral BID BM   folic acid   1 mg Oral Daily   LORazepam   0-4 mg Intravenous Q8H   LORazepam   2 mg Intravenous Once   magic mouthwash  10 mL Oral QID   multivitamin with minerals  1 tablet Oral Daily   pantoprazole  (PROTONIX ) IV  40 mg Intravenous Q12H   phosphorus  500 mg Oral BID   propranolol   20 mg Oral BID   sucralfate   1 g Oral TID WC & HS   Continuous Infusions:  thiamine  (VITAMIN B1) injection 500 mg (07/12/24 2207)   PRN Meds:.acetaminophen  **OR** acetaminophen , ipratropium-albuterol , LORazepam  **OR** LORazepam , metoprolol  tartrate, polyethylene glycol  DVT  Prophylaxis   Recent Labs  Lab 07/09/24 0334 07/10/24 0316 07/11/24 0527 07/12/24 0522 07/13/24 0618  WBC 3.9* 3.0* 4.4 3.5* 4.5  HGB 9.9* 9.1* 9.4* 9.0* 8.8*  HCT 27.2* 25.5* 25.8* 25.3* 25.2*  PLT 59* 60* 76* 85* 118*  MCV 106.7* 108.1* 106.2* 106.3* 108.6*  MCH 38.8* 38.6* 38.7* 37.8* 37.9*  MCHC 36.4* 35.7 36.4* 35.6 34.9  RDW 13.3 13.6 13.2 13.4 13.9  LYMPHSABS 0.7  --   --   --   --   MONOABS 0.3  --   --   --   --   EOSABS 0.0  --   --   --   --   BASOSABS 0.0  --   --   --   --     Recent Labs  Lab 07/06/24 1408 07/06/24 2236 07/07/24 0349 07/07/24 0349 07/07/24 0404 07/07/24 1330 07/08/24 0306 07/08/24 0306 07/08/24 0609 07/09/24 0334 07/10/24 0316 07/11/24 0527 07/12/24 0522 07/13/24 0618  NA  --   --  137   < >  --   --  138  --   --  136 139 139 138 136  K  --   --   4.3   < >  --   --  2.8*  --   --  4.0 3.5 3.3* 3.3* 4.0  CL  --   --  96*   < >  --   --  99  --   --  102 103 106 105 107  CO2  --   --  15*   < >  --   --  26  --   --  28 24 23 23  21*  ANIONGAP  --   --  25*   < >  --   --  13  --   --  7 11 10 10 8   GLUCOSE  --   --  111*   < >  --   --  177*  --   --  127* 95 94 86 95  BUN  --   --  55*   < >  --   --  38*  --   --  17 16 14 10 11   CREATININE  --   --  3.08*   < >  --   --  1.67*  --   --  0.96 0.89 0.89 0.76 0.86  AST  --   --  121*  --   --   --  155*  --   --  64*  --   --   --   --   ALT  --   --  57*  --   --   --  68*  --   --  45*  --   --   --   --   ALKPHOS  --   --  51  --   --   --  52  --   --  54  --   --   --   --   BILITOT  --   --  1.8*  --   --   --  1.0  --   --  1.3*  --   --   --   --   ALBUMIN  --   --  3.1*  --   --   --  2.7*  --   --  2.7*  --   --   --   --  CRP  --   --   --   --   --   --   --   --  1.3*  --   --   --   --   --   PROCALCITON  --   --   --   --   --   --   --   --  1.29  --   --   --   --   --   LATICACIDVEN 5.6* 3.1*  --   --  2.0*  --   --   --   --   --   --   --   --   --   TSH  --   --  5.340*  --   --   --   --   --   --   --   --   --   --   --   AMMONIA  --   --   --   --   --  37*  --   --   --   --   --   --   --   --   MG  --   --   --   --   --   --   --    < > 1.3* 1.7 1.8 1.9 1.5* 2.0  PHOS  --   --   --   --   --   --   --   --   --  <1.0* 2.3* 1.8* 2.8 2.5  CALCIUM  --   --  7.5*   < >  --   --  7.3*  --   --  7.4* 7.3* 7.8* 7.6* 7.4*   < > = values in this interval not displayed.      Recent Labs  Lab 07/06/24 1408 07/06/24 2236 07/07/24 0349 07/07/24 0404 07/07/24 1330 07/08/24 0306 07/08/24 9390 07/09/24 0334 07/10/24 0316 07/11/24 0527 07/12/24 0522 07/13/24 0618  CRP  --   --   --   --   --   --  1.3*  --   --   --   --   --   PROCALCITON  --   --   --   --   --   --  1.29  --   --   --   --   --   LATICACIDVEN 5.6* 3.1*  --  2.0*  --   --   --   --   --    --   --   --   TSH  --   --  5.340*  --   --   --   --   --   --   --   --   --   AMMONIA  --   --   --   --  37*  --   --   --   --   --   --   --   MG  --   --   --   --   --    < > 1.3* 1.7 1.8 1.9 1.5* 2.0  CALCIUM  --   --  7.5*  --   --    < >  --  7.4* 7.3* 7.8* 7.6* 7.4*   < > = values in this interval not displayed.    --------------------------------------------------------------------------------------------------------------- Lab Results  Component Value Date  CHOL 153 11/24/2023   HDL 52 11/24/2023   LDLCALC 78 11/24/2023   TRIG 114 11/24/2023   CHOLHDL 2.9 11/24/2023    Lab Results  Component Value Date   HGBA1C 4.6 (L) 11/23/2023   No results for input(s): TSH, T4TOTAL, FREET4, T3FREE, THYROIDAB in the last 72 hours.  No results for input(s): VITAMINB12, FOLATE, FERRITIN, TIBC, IRON, RETICCTPCT in the last 72 hours.  ------------------------------------------------------------------------------------------------------------------ Cardiac Enzymes No results for input(s): CKMB, TROPONINI, MYOGLOBIN in the last 168 hours.  Invalid input(s): CK  Micro Results Recent Results (from the past 240 hours)  Resp panel by RT-PCR (RSV, Flu A&B, Covid) Anterior Nasal Swab     Status: None   Collection Time: 07/06/24 11:46 AM   Specimen: Anterior Nasal Swab  Result Value Ref Range Status   SARS Coronavirus 2 by RT PCR NEGATIVE NEGATIVE Final   Influenza A by PCR NEGATIVE NEGATIVE Final   Influenza B by PCR NEGATIVE NEGATIVE Final    Comment: (NOTE) The Xpert Xpress SARS-CoV-2/FLU/RSV plus assay is intended as an aid in the diagnosis of influenza from Nasopharyngeal swab specimens and should not be used as a sole basis for treatment. Nasal washings and aspirates are unacceptable for Xpert Xpress SARS-CoV-2/FLU/RSV testing.  Fact Sheet for Patients: bloggercourse.com  Fact Sheet for Healthcare  Providers: seriousbroker.it  This test is not yet approved or cleared by the [place]  FDA and has been authorized for detection and/or diagnosis of SARS-CoV-2 by FDA under an Emergency Use Authorization (EUA). This EUA will remain in effect (meaning this test can be used) for the duration of the COVID-19 declaration under Section 564(b)(1) of the Act, 21 U.S.C. section 360bbb-3(b)(1), unless the authorization is terminated or revoked.     Resp Syncytial Virus by PCR NEGATIVE NEGATIVE Final    Comment: (NOTE) Fact Sheet for Patients: bloggercourse.com  Fact Sheet for Healthcare Providers: seriousbroker.it  This test is not yet approved or cleared by the [place]  FDA and has been authorized for detection and/or diagnosis of SARS-CoV-2 by FDA under an Emergency Use Authorization (EUA). This EUA will remain in effect (meaning this test can be used) for the duration of the COVID-19 declaration under Section 564(b)(1) of the Act, 21 U.S.C. section 360bbb-3(b)(1), unless the authorization is terminated or revoked.  Performed at [facility], [address] [place], [place] [address]   Blood Culture (routine x 2)     Status: None   Collection Time: 07/06/24 11:51 AM   Specimen: BLOOD RIGHT ARM  Result Value Ref Range Status   Specimen Description BLOOD RIGHT ARM  Final   Special Requests   Final    BOTTLES DRAWN AEROBIC AND ANAEROBIC Blood Culture adequate volume   Culture   Final    NO GROWTH 5 DAYS Performed at [facility], [address] [place], [place] [address]    Report Status 07/11/2024 FINAL  Final  Blood Culture (routine x 2)     Status: None   Collection Time: 07/06/24 12:18 PM   Specimen: BLOOD  Result Value Ref Range Status   Specimen Description BLOOD  Final   Special Requests NONE  Final   Culture   Final    NO GROWTH 5 DAYS Performed at [facility],  [address] [place], [place] [address]    Report Status 07/11/2024 FINAL  Final  MRSA Next Gen by PCR, Nasal     Status: None   Collection Time: 07/07/24  5:27  PM   Specimen: Nasal Mucosa; Nasal Swab  Result Value Ref Range Status   MRSA by PCR Next Gen NOT DETECTED NOT DETECTED Final    Comment: (NOTE) The GeneXpert MRSA Assay (FDA approved for NASAL specimens only), is one component of a comprehensive MRSA colonization surveillance program. It is not intended to diagnose MRSA infection nor to guide or monitor treatment for MRSA infections. Test performance is not FDA approved in patients less than 27 years old. Performed at [facility], [address], [place], [place] [address]   Respiratory (~20 pathogens) panel by PCR     Status: None   Collection Time: 07/08/24 10:13 AM   Specimen: Nasopharyngeal Swab; Respiratory  Result Value Ref Range Status   Adenovirus NOT DETECTED NOT DETECTED Final   Coronavirus 229E NOT DETECTED NOT DETECTED Final    Comment: (NOTE) The Coronavirus on the Respiratory Panel, DOES NOT test for the novel  Coronavirus (2019 nCoV)    Coronavirus HKU1 NOT DETECTED NOT DETECTED Final   Coronavirus NL63 NOT DETECTED NOT DETECTED Final   Coronavirus OC43 NOT DETECTED NOT DETECTED Final   Metapneumovirus NOT DETECTED NOT DETECTED Final   Rhinovirus / Enterovirus NOT DETECTED NOT DETECTED Final   Influenza A NOT DETECTED NOT DETECTED Final   Influenza B NOT DETECTED NOT DETECTED Final   Parainfluenza Virus 1 NOT DETECTED NOT DETECTED Final   Parainfluenza Virus 2 NOT DETECTED NOT DETECTED Final   Parainfluenza Virus 3 NOT DETECTED NOT DETECTED Final   Parainfluenza Virus 4 NOT DETECTED NOT DETECTED Final   Respiratory Syncytial Virus NOT DETECTED NOT DETECTED Final   Bordetella pertussis NOT DETECTED NOT DETECTED Final   Bordetella Parapertussis NOT DETECTED NOT DETECTED Final   Chlamydophila pneumoniae NOT DETECTED NOT DETECTED Final   Mycoplasma  pneumoniae NOT DETECTED NOT DETECTED Final    Comment: Performed at [facility], [address] [place], [place] [address]    Radiology Reports  No results found.     Signature  -   Brayton Lye M.D on 07/13/2024 at 2:02 PM   -  To page go to [email or web address]       07/13/2024, 2:02 PM   "

## 2024-07-13 NOTE — TOC Progression Note (Signed)
 Transition of Care [facility]) - Progression Note    Patient Details  Name: Gabriel Everett MRN: [id number] Date of Birth: 11-Jan-1946  [facility] of [facility]) CM/SW Contact  Inocente GORMAN Kindle, LCSW Phone Number: 07/13/2024, 11:23 AM  Clinical Narrative:    CSW continuing to follow.    Expected Discharge Plan: Skilled Nursing Facility Barriers to Discharge: Continued Medical Work up, English As A Second Language Teacher, SNF Pending bed offer               Expected Discharge Plan and Services In-house Referral: Clinical Social Work   Post Acute Care Choice: Skilled Nursing Facility Living arrangements for the past 2 months: Single Family Home                                       Social Drivers of Health (SDOH) Interventions SDOH Screenings   Food Insecurity: No Food Insecurity (07/07/2024)  Housing: Low Risk (07/07/2024)  Transportation Needs: No Transportation Needs (07/07/2024)  Utilities: Not At Risk (07/07/2024)  Social Connections: Socially Isolated (07/07/2024)  Tobacco Use: Low Risk (07/09/2024)    Readmission Risk Interventions     No data to display

## 2024-07-14 ENCOUNTER — Inpatient Hospital Stay (HOSPITAL_COMMUNITY)

## 2024-07-14 DIAGNOSIS — G9340 Encephalopathy, unspecified: Secondary | ICD-10-CM | POA: Diagnosis not present

## 2024-07-14 LAB — MAGNESIUM: Magnesium: 2 mg/dL (ref 1.7–2.4)

## 2024-07-14 LAB — CBC
HCT: 25.7 % — ABNORMAL LOW (ref 39.0–52.0)
Hemoglobin: 9.2 g/dL — ABNORMAL LOW (ref 13.0–17.0)
MCH: 38.5 pg — ABNORMAL HIGH (ref 26.0–34.0)
MCHC: 35.8 g/dL (ref 30.0–36.0)
MCV: 107.5 fL — ABNORMAL HIGH (ref 80.0–100.0)
Platelets: 156 10*3/uL (ref 150–400)
RBC: 2.39 MIL/uL — ABNORMAL LOW (ref 4.22–5.81)
RDW: 13.7 % (ref 11.5–15.5)
WBC: 5.5 10*3/uL (ref 4.0–10.5)
nRBC: 0.4 % — ABNORMAL HIGH (ref 0.0–0.2)

## 2024-07-14 LAB — PHOSPHORUS: Phosphorus: 2.7 mg/dL (ref 2.5–4.6)

## 2024-07-14 LAB — BASIC METABOLIC PANEL WITH GFR
Anion gap: 8 (ref 5–15)
BUN: 14 mg/dL (ref 8–23)
CO2: 20 mmol/L — ABNORMAL LOW (ref 22–32)
Calcium: 7.8 mg/dL — ABNORMAL LOW (ref 8.9–10.3)
Chloride: 108 mmol/L (ref 98–111)
Creatinine, Ser: 0.9 mg/dL (ref 0.61–1.24)
GFR, Estimated: >60 mL/min
Glucose, Bld: 99 mg/dL (ref 70–99)
Potassium: 3.7 mmol/L (ref 3.5–5.1)
Sodium: 137 mmol/L (ref 135–145)

## 2024-07-14 MED ORDER — HALOPERIDOL LACTATE 5 MG/ML IJ SOLN
2.0000 mg | Freq: Once | INTRAMUSCULAR | Status: AC
  Administered 2024-07-14: 2 mg via INTRAVENOUS
  Filled 2024-07-14: qty 1

## 2024-07-14 MED ORDER — HEPARIN SODIUM (PORCINE) 5000 UNIT/ML IJ SOLN
5000.0000 [IU] | Freq: Three times a day (TID) | INTRAMUSCULAR | Status: DC
  Administered 2024-07-14 – 2024-07-16 (×6): 5000 [IU] via SUBCUTANEOUS
  Filled 2024-07-14 (×6): qty 1

## 2024-07-14 MED ORDER — METOPROLOL TARTRATE 25 MG PO TABS
25.0000 mg | ORAL_TABLET | Freq: Two times a day (BID) | ORAL | Status: DC
  Filled 2024-07-14 (×2): qty 1

## 2024-07-14 MED ORDER — PANTOPRAZOLE SODIUM 40 MG PO TBEC: 40.0000 mg | DELAYED_RELEASE_TABLET | Freq: Two times a day (BID) | ORAL | Status: AC

## 2024-07-14 MED ORDER — ENSURE PLUS HIGH PROTEIN PO LIQD: 237.0000 mL | Freq: Two times a day (BID) | ORAL | Status: AC

## 2024-07-14 MED ORDER — POLYETHYLENE GLYCOL 3350 17 G PO PACK: 17.0000 g | PACK | Freq: Every day | ORAL | Status: AC | PRN

## 2024-07-14 MED ORDER — ADULT MULTIVITAMIN W/MINERALS CH: 1.0000 | ORAL_TABLET | Freq: Every day | ORAL | Status: AC

## 2024-07-14 MED ORDER — MIDODRINE HCL 5 MG PO TABS
5.0000 mg | ORAL_TABLET | Freq: Three times a day (TID) | ORAL | Status: DC
  Administered 2024-07-15 – 2024-07-16 (×4): 5 mg via ORAL
  Filled 2024-07-14 (×4): qty 1

## 2024-07-14 MED ORDER — CYANOCOBALAMIN 1000 MCG PO TABS: 1000.0000 ug | ORAL_TABLET | Freq: Every day | ORAL | Status: AC

## 2024-07-14 MED ORDER — MIDODRINE HCL 5 MG PO TABS
10.0000 mg | ORAL_TABLET | Freq: Once | ORAL | Status: DC
  Filled 2024-07-14: qty 2

## 2024-07-14 MED ORDER — FOLIC ACID 1 MG PO TABS: 1.0000 mg | ORAL_TABLET | Freq: Every day | ORAL | Status: AC

## 2024-07-14 MED ORDER — ACETAMINOPHEN 325 MG PO TABS: 650.0000 mg | ORAL_TABLET | Freq: Four times a day (QID) | ORAL | Status: AC | PRN

## 2024-07-14 MED ORDER — LACTATED RINGERS IV SOLN: INTRAVENOUS | Status: AC

## 2024-07-14 MED ORDER — THIAMINE HCL 100 MG PO TABS: 100.0000 mg | ORAL_TABLET | Freq: Every day | ORAL | Status: AC

## 2024-07-14 MED ORDER — HALOPERIDOL LACTATE 5 MG/ML IJ SOLN
5.0000 mg | Freq: Four times a day (QID) | INTRAMUSCULAR | Status: DC | PRN
  Filled 2024-07-14: qty 1

## 2024-07-14 MED ORDER — SUCRALFATE 1 GM/10ML PO SUSP: 1.0000 g | Freq: Three times a day (TID) | ORAL | Status: AC

## 2024-07-14 NOTE — Progress Notes (Signed)
 Pt not agreeable to having IV placed. IV RN attempted to stick pt twice w/ pt not tolerating and becoming increasingly agitated.

## 2024-07-14 NOTE — Discharge Summary (Signed)
 Physician Discharge Summary  Gabriel Everett FMW:[id number] DOB: Jun 22, 1945 DOA: 07/06/2024  PCP: [name], FNP  Admit date: 07/06/2024 Discharge date: 07/14/2024  Admitted From: (Home) Disposition:  (SNF)  Recommendations for Outpatient Follow-up:  Please obtain CMP/CBC/magnesium /phosphorus in one week Will need routine follow-up with GI as an outpatient regarding finding of esophagitis. Please check B12 level in 4 to 6 weeks on oral supplements   Diet recommendation: Dysphagia 3 with thin liquid  Brief/Interim Summary:  79 year old with past medical history significant for hypertension, A-fib, alcohol  use presented with altered mental status and weakness.  Patient was found down on the floor by family.  EMS was contacted.  Patient was noted to be confused.  Patient was recently treated for the flu a week ago.  Patient was noted to have multiple skin tears.  On EMS evaluation initial blood pressure was in the 70s.  Improved with IV fluids.  Patient in route had seizure lasting 1 minute with tonic-clonic activity.  - Evaluation in the ED presented with a creatinine of 2.7 baseline of 1, bilirubin 3.3, white blood cell 11, hemoglobin 11, platelets 108, INR 1.5, lactic acid 7 subsequently repeated 5.  Respiratory panel, flu COVID RSV negative.  Chest x-ray showed no acute abnormality.  CT head showed no acute abnormality.  CT of chest abdomen and pelvis showed no acute abnormality but did show some hepatic steatosis  Acute metabolic encephalopathy Severe dehydration Alcohol  withdrawal with DTs -Was found down by family on the floor, has been laying down for few hours, workup on admission significant for severe dehydration and significant electrolyte derangements.  And mild rhabdomyolysis. -questionable history of alcohol  withdrawals versus seizure -  MRI brain and EEG nonacute, he denies any headache, he appears severely dehydrated with severe electrolyte abnormalities - PT/OT input  appreciated, will need SNF placement  - Was in acute withdrawals, this has much improved.   Alcohol  abuse Alcohol   withdrawal -Drinks on average 4 shots per day pressure grandson at bedside - On CIWA protocol for DTs, as well on scheduled low dose Librium , did not require any for last couple days,. - He was treated with high-dose thiamine  during hospital stay, will discharge on thiamine , folic acid  and multivitamins - Borderline B12, continue with B12 and folic acid  on discharge.      AKI Mild rhabdo Metabolic acidosis with elevated anion gap -AKI on presentation, with elevated total CK, with evidence of urine dipstick for blood while no RBCs seen in hpf.  Total CK has normalized. - Resolved with IV fluids     Hypokalemia Hypomagnesemia Hypophosphatemia - This was replaced, normalized on discharge   SIRS Hypothermia Possible UTI Due to being exposed to elements and down for several hours, supportive care, TSH stable, B12 borderline low which will be replaced, no signs of infectious focus sipped possible mild UTI.  Finished 5 days of Rocephin  for UTI   Coffee Ground emesis;  Severe esophagitis Odynophagia Dysphagia - Questionable dark emesis was noted by family, during hospital stay there was no  further evidence of coffee-ground emesis or melena or significant GI bleed. - Patient reports some difficulty/pain swallowing, likely underlying fasciitis with his history of heavy alcohol  abuse, no acute finding on CT chest/abdomen/pelvis - Hemoglobin has trended down slowly, but this is most likely delusional effect,. - Went for MBS today, no difficulty swallowing his barium tablets or acute abnormalities found on the study. - Started on IV Protonix  40 mg p.o. twice daily, and Carafate , symptoms much improved, will  discontinue as an outpatient, recommended for the patient to follow-up with GI as an outpatient once stable   Hypertension - Pressure initially soft, started to increase,  resume home regimen   A-fib - Back on beta-blockers -Was not on anticoagulation prior to admission, and I will refrain from starting in the setting of his falls, thrombocytopenia.   Thrombocytopenia - could be related to infectious process versus alcohol .  Improving   Mild asymptomatic transaminitis. - Negative hepatitis panel - Ultrasound significant for hepatic steatosis - This is secondary to alcohol  abuse, improving       Discharge Diagnoses:  Principal Problem:   Acute encephalopathy Active Problems:   SIRS (systemic inflammatory response syndrome) (HCC)   Essential hypertension   Alcohol  use   PAF (paroxysmal atrial fibrillation) (HCC)   Seizure-like activity (HCC)   AKI (acute kidney injury)   High anion gap metabolic acidosis    Discharge Instructions  Discharge Instructions     Increase activity slowly   Complete by: As directed    No wound care   Complete by: As directed       Allergies as of 07/14/2024       Reactions   Cardizem  [diltiazem ] Swelling   Swelling in lower legs         Medication List     STOP taking these medications    cephALEXin 500 MG capsule Commonly known as: KEFLEX   ibuprofen  400 MG tablet Commonly known as: ADVIL    ondansetron  4 MG disintegrating tablet Commonly known as: ZOFRAN -ODT       TAKE these medications    acetaminophen  325 MG tablet Commonly known as: TYLENOL  Take 2 tablets (650 mg total) by mouth every 6 (six) hours as needed for mild pain (pain score 1-3) or fever (or Fever >/= 101).   cyanocobalamin  1000 MCG tablet Take 1 tablet (1,000 mcg total) by mouth daily. Start taking on: July 15, 2024   feeding supplement Liqd Take 237 mLs by mouth 2 (two) times daily between meals.   folic acid  1 MG tablet Commonly known as: FOLVITE  Take 1 tablet (1 mg total) by mouth daily. Start taking on: July 15, 2024   multivitamin with minerals Tabs tablet Take 1 tablet by mouth daily. Start taking  on: July 15, 2024   pantoprazole  40 MG tablet Commonly known as: Protonix  Take 1 tablet (40 mg total) by mouth 2 (two) times daily before a meal.   polyethylene glycol 17 g packet Commonly known as: MIRALAX  / GLYCOLAX  Take 17 g by mouth daily as needed for mild constipation.   propranolol  40 MG tablet Commonly known as: INDERAL  Take 20 mg by mouth 2 (two) times daily.   sucralfate  1 GM/10ML suspension Commonly known as: CARAFATE  Take 10 mLs (1 g total) by mouth 4 (four) times daily -  with meals and at bedtime.   thiamine  100 MG tablet Commonly known as: VITAMIN B1 Take 1 tablet (100 mg total) by mouth daily.        Contact information for after-discharge care     Destination     [facility] .   Service: Skilled Nursing Contact information: [address] [place]  [address] [phone]                    Allergies[1]  Consultations: None   Procedures/Studies: US  Abdomen Limited RUQ (LIVER/GB) Result Date: 07/08/2024 CLINICAL DATA:  Transaminitis EXAM: ULTRASOUND ABDOMEN LIMITED RIGHT UPPER QUADRANT COMPARISON:  July 06, 2024.  November 23, 2023. FINDINGS: Gallbladder: No gallstones or wall thickening visualized. No sonographic Murphy sign noted by sonographer. 6 mm polyp is noted. Sludge is noted. Common bile duct: Diameter: 4 mm which is within normal limits. Liver: No focal lesion identified. Increased echogenicity of hepatic parenchyma is noted suggesting hepatic steatosis. Portal vein is patent on color Doppler imaging with normal direction of blood flow towards the liver. Other: None. IMPRESSION: 1. Hepatic steatosis. 2. 6 mm gallbladder polyp is noted. Follow-up ultrasound in 1 year is recommended to ensure stability. 3. Sludge is noted within gallbladder lumen. No cholelithiasis or cholecystitis is noted. Electronically Signed   By: Lynwood Landy Raddle M.D.   On: 07/08/2024 15:25   DG Chest Port 1 View Result Date: 07/08/2024 CLINICAL  DATA:  Shortness of breath. EXAM: PORTABLE CHEST 1 VIEW COMPARISON:  07/06/2024 FINDINGS: The lungs are clear without focal pneumonia, edema, pneumothorax or pleural effusion. Cardiopericardial silhouette is at upper limits of normal for size. No acute bony abnormality. Telemetry leads overlie the chest. IMPRESSION: No active disease. Electronically Signed   By: Camellia Candle M.D.   On: 07/08/2024 06:15   EEG adult Result Date: 07/07/2024 Shelton Arlin KIDD, MD     07/07/2024  4:14 PM Patient Name: TIYON SANOR MRN: [id number] Epilepsy Attending: Arlin KIDD Shelton Referring Physician/Provider: Seena Marsa NOVAK, MD Date: 07/07/2024 Duration: 23 mins Patient history: 79yo M with ams. EEG to evaluate for seizure Level of alertness: Awake, asleep AEDs during EEG study: None Technical aspects: This EEG study was done with scalp electrodes positioned according to the 10-20 International system of electrode placement. Electrical activity was reviewed with band pass filter of 1-70Hz , sensitivity of 7 uV/mm, display speed of 31mm/sec with a 60Hz  notched filter applied as appropriate. EEG data were recorded continuously and digitally stored.  Video monitoring was available and reviewed as appropriate. Description: The posterior dominant rhythm consists of 8 Hz activity of moderate voltage (25-35 uV) seen predominantly in posterior head regions, symmetric and reactive to eye opening and eye closing. Sleep was characterized by vertex waves, sleep spindles (12 to 14 Hz), maximal frontocentral region. Hyperventilation and photic stimulation were not performed.   IMPRESSION: This study is within normal limits. No seizures or epileptiform discharges were seen throughout the recording. A normal interictal EEG does not exclude the diagnosis of epilepsy. Arlin KIDD Shelton   MR BRAIN WO CONTRAST Result Date: 07/06/2024 EXAM: MRI BRAIN WITHOUT CONTRAST 07/06/2024 10:15:01 PM TECHNIQUE: Multiplanar multisequence MRI of the  head/brain was performed without the administration of intravenous contrast. COMPARISON: None available. CLINICAL HISTORY: Seizure, new-onset, no history of trauma. FINDINGS: BRAIN AND VENTRICLES: No acute infarct. No intracranial hemorrhage. No mass. No midline shift. No hydrocephalus. Mild multifocal hyperintense T2-weighted signal within the cerebral white matter, most commonly due to chronic small vessel disease. Mild volume loss not greater than expected for age. The hippocampi are normal and symmetric in size and signal. The hypothalamus and mammillary bodies are normal. There is no cortical ectopia or dysplasia. The sella is unremarkable. Normal flow voids. ORBITS: No significant abnormality. SINUSES AND MASTOIDS: No significant abnormality. BONES AND SOFT TISSUES: Normal marrow signal. No soft tissue abnormality. IMPRESSION: 1. No acute intracranial abnormality. 2. No epileptogenic lesion identified. Electronically signed by: Franky Stanford MD 07/06/2024 10:19 PM EST RP Workstation: [id number]   CT CHEST ABDOMEN PELVIS WO CONTRAST Result Date: 07/06/2024 EXAM: CT CHEST, ABDOMEN AND PELVIS WITHOUT CONTRAST 07/06/2024 02:31:11 PM TECHNIQUE: CT of the chest, abdomen and pelvis  was performed without the administration of intravenous contrast. Multiplanar reformatted images are provided for review. Automated exposure control, iterative reconstruction, and/or weight based adjustment of the mA/kV was utilized to reduce the radiation dose to as low as reasonably achievable. COMPARISON: CT chest 09/07/2022. CLINICAL HISTORY: Sepsis. FINDINGS: CHEST: MEDIASTINUM AND LYMPH NODES: Heart and pericardium are unremarkable. The central airways are clear. No mediastinal, hilar or axillary lymphadenopathy. LUNGS AND PLEURA: No focal consolidation or pulmonary edema. No pleural effusion. No pneumothorax. ABDOMEN AND PELVIS: LIVER: Attenuation in the liver consistent with hepatic steatosis. GALLBLADDER AND BILE DUCTS:  Unremarkable. No biliary ductal dilatation. SPLEEN: No acute abnormality. PANCREAS: No acute abnormality. ADRENAL GLANDS: No acute abnormality. KIDNEYS, URETERS AND BLADDER: No stones in the kidneys or ureters. No hydronephrosis. No perinephric or periureteral stranding. Urinary bladder is unremarkable. GI AND BOWEL: Stomach demonstrates no acute abnormality. There is no bowel obstruction. REPRODUCTIVE ORGANS: No acute abnormality. PERITONEUM AND RETROPERITONEUM: No ascites. No free air. VASCULATURE: Aorta is normal in caliber. ABDOMINAL AND PELVIS LYMPH NODES: No lymphadenopathy. REPRODUCTIVE ORGANS: No acute abnormality. BONES AND SOFT TISSUES: No acute osseous abnormality. No focal soft tissue abnormality. OVERALL ASSESSMENT: No evidence of infection in the chest, abdomen, or pelvis. No acute findings. IMPRESSION: 1. No evidence of infection in the chest, abdomen, or pelvis. 2. No acute findings. 3. Hepatic steatosis. Electronically signed by: Norleen Boxer MD 07/06/2024 03:30 PM EST RP Workstation: [id number]   CT Head Wo Contrast Result Date: 07/06/2024 EXAM: CT HEAD WITHOUT CONTRAST 07/06/2024 02:31:11 PM TECHNIQUE: CT of the head was performed without the administration of intravenous contrast. Automated exposure control, iterative reconstruction, and/or weight based adjustment of the mA/kV was utilized to reduce the radiation dose to as low as reasonably achievable. COMPARISON: 11/22/2023 CLINICAL HISTORY: Mental status change, unknown cause. FINDINGS: BRAIN AND VENTRICLES: No acute hemorrhage. No evidence of acute infarct. No hydrocephalus. No extra-axial collection. No mass effect or midline shift. Generalized cerebral volume loss and chronic small vessel ischemic disease changes are stable. Intracranial arterial calcification. ORBITS: No acute abnormality. SINUSES: Mild mucosal thickening in paranasal sinuses. Chronic rightward nasal septal deviation. SOFT TISSUES AND SKULL: No acute soft tissue  abnormality. No skull fracture. IMPRESSION: 1. No acute intracranial abnormality. 2. Stable generalized cerebral volume loss and chronic small vessel ischemic disease changes. Electronically signed by: Lonni Necessary MD 07/06/2024 03:05 PM EST RP Workstation: [id number]   DG Chest Port 1 View Result Date: 07/06/2024 EXAM: 1 VIEW(S) XRAY OF THE CHEST 07/06/2024 12:09:00 PM COMPARISON: 02/16/2018 CLINICAL HISTORY: Questionable sepsis - evaluate for abnormality FINDINGS: LUNGS AND PLEURA: No focal pulmonary opacity. No pleural effusion. No pneumothorax. HEART AND MEDIASTINUM: Atherosclerotic calcifications. No acute abnormality of the cardiac and mediastinal silhouettes. BONES AND SOFT TISSUES: No acute osseous abnormality. IMPRESSION: 1. No acute findings. Electronically signed by: Waddell Calk MD 07/06/2024 12:58 PM EST RP Workstation: [id number]      Subjective: No significant events overnight as discussed with staff, he denies any complaints today  Discharge Exam: Vitals:   07/14/24 1049 07/14/24 1050  BP:  (!) 114/52  Pulse:  70  Resp:    Temp: 98.5 F (36.9 C)   SpO2:     Vitals:   07/14/24 0402 07/14/24 0820 07/14/24 1049 07/14/24 1050  BP: 130/82   (!) 114/52  Pulse: 65   70  Resp: 19     Temp: 99.3 F (37.4 C) (!) 97.5 F (36.4 C) 98.5 F (36.9 C)   TempSrc: Oral Oral Oral   SpO2:  98%     Weight:      Height:        General: Pt is alert, awake, not in acute distress, frail, deconditioned Cardiovascular: irregular, S1/S2 +, no rubs, no gallops Respiratory: CTA bilaterally, no wheezing, no rhonchi Abdominal: Soft, NT, ND, bowel sounds + Extremities: no edema, no cyanosis    The results of significant diagnostics from this hospitalization (including imaging, microbiology, ancillary and laboratory) are listed below for reference.     Microbiology: Recent Results (from the past 240 hours)  Resp panel by RT-PCR (RSV, Flu A&B, Covid) Anterior Nasal Swab      Status: None   Collection Time: 07/06/24 11:46 AM   Specimen: Anterior Nasal Swab  Result Value Ref Range Status   SARS Coronavirus 2 by RT PCR NEGATIVE NEGATIVE Final   Influenza A by PCR NEGATIVE NEGATIVE Final   Influenza B by PCR NEGATIVE NEGATIVE Final    Comment: (NOTE) The Xpert Xpress SARS-CoV-2/FLU/RSV plus assay is intended as an aid in the diagnosis of influenza from Nasopharyngeal swab specimens and should not be used as a sole basis for treatment. Nasal washings and aspirates are unacceptable for Xpert Xpress SARS-CoV-2/FLU/RSV testing.  Fact Sheet for Patients: bloggercourse.com  Fact Sheet for Healthcare Providers: seriousbroker.it  This test is not yet approved or cleared by the [place]  FDA and has been authorized for detection and/or diagnosis of SARS-CoV-2 by FDA under an Emergency Use Authorization (EUA). This EUA will remain in effect (meaning this test can be used) for the duration of the COVID-19 declaration under Section 564(b)(1) of the Act, 21 U.S.C. section 360bbb-3(b)(1), unless the authorization is terminated or revoked.     Resp Syncytial Virus by PCR NEGATIVE NEGATIVE Final    Comment: (NOTE) Fact Sheet for Patients: bloggercourse.com  Fact Sheet for Healthcare Providers: seriousbroker.it  This test is not yet approved or cleared by the [place]  FDA and has been authorized for detection and/or diagnosis of SARS-CoV-2 by FDA under an Emergency Use Authorization (EUA). This EUA will remain in effect (meaning this test can be used) for the duration of the COVID-19 declaration under Section 564(b)(1) of the Act, 21 U.S.C. section 360bbb-3(b)(1), unless the authorization is terminated or revoked.  Performed at [facility], [address], [place], [place] [address]   Blood Culture (routine x 2)     Status: None   Collection Time:  07/06/24 11:51 AM   Specimen: BLOOD RIGHT ARM  Result Value Ref Range Status   Specimen Description BLOOD RIGHT ARM  Final   Special Requests   Final    BOTTLES DRAWN AEROBIC AND ANAEROBIC Blood Culture adequate volume   Culture   Final    NO GROWTH 5 DAYS Performed at [facility], [address], [place], [place] [address]    Report Status 07/11/2024 FINAL  Final  Blood Culture (routine x 2)     Status: None   Collection Time: 07/06/24 12:18 PM   Specimen: BLOOD  Result Value Ref Range Status   Specimen Description BLOOD  Final   Special Requests NONE  Final   Culture   Final    NO GROWTH 5 DAYS Performed at [facility], [address], [place], [place] [address]    Report Status 07/11/2024 FINAL  Final  MRSA Next Gen by PCR, Nasal     Status: None   Collection Time: 07/07/24  5:27 PM   Specimen: Nasal Mucosa; Nasal Swab  Result Value Ref Range Status   MRSA by PCR Next Gen NOT DETECTED NOT DETECTED Final    Comment: (NOTE) The GeneXpert MRSA Assay (FDA approved for NASAL specimens only), is one component of a comprehensive MRSA colonization surveillance program. It is not intended to diagnose MRSA infection nor to guide or monitor treatment for MRSA infections. Test performance is not FDA approved in patients less than 64 years old. Performed at [facility], [address] [place], [place] [address]   Respiratory (~20 pathogens) panel by PCR     Status: None   Collection Time: 07/08/24 10:13 AM   Specimen: Nasopharyngeal Swab; Respiratory  Result Value Ref Range Status   Adenovirus NOT DETECTED NOT DETECTED Final   Coronavirus 229E NOT DETECTED NOT DETECTED Final    Comment: (NOTE) The Coronavirus on the Respiratory Panel, DOES NOT test for the novel  Coronavirus (2019 nCoV)    Coronavirus HKU1 NOT DETECTED NOT DETECTED Final   Coronavirus NL63 NOT DETECTED NOT DETECTED Final   Coronavirus OC43 NOT DETECTED NOT DETECTED Final   Metapneumovirus  NOT DETECTED NOT DETECTED Final   Rhinovirus / Enterovirus NOT DETECTED NOT DETECTED Final   Influenza A NOT DETECTED NOT DETECTED Final   Influenza B NOT DETECTED NOT DETECTED Final   Parainfluenza Virus 1 NOT DETECTED NOT DETECTED Final   Parainfluenza Virus 2 NOT DETECTED NOT DETECTED Final   Parainfluenza Virus 3 NOT DETECTED NOT DETECTED Final   Parainfluenza Virus 4 NOT DETECTED NOT DETECTED Final   Respiratory Syncytial Virus NOT DETECTED NOT DETECTED Final   Bordetella pertussis NOT DETECTED NOT DETECTED Final   Bordetella Parapertussis NOT DETECTED NOT DETECTED Final   Chlamydophila pneumoniae NOT DETECTED NOT DETECTED Final   Mycoplasma pneumoniae NOT DETECTED NOT DETECTED Final    Comment: Performed at [facility], [address], [place], [place] [address]     Labs: BNP (last 3 results) No results for input(s): BNP in the last 8760 hours. Basic Metabolic Panel: Recent Labs  Lab 07/10/24 0316 07/11/24 0527 07/12/24 0522 07/13/24 0618 07/14/24 0217  NA 139 139 138 136 137  K 3.5 3.3* 3.3* 4.0 3.7  CL 103 106 105 107 108  CO2 24 23 23  21* 20*  GLUCOSE 95 94 86 95 99  BUN 16 14 10 11 14   CREATININE 0.89 0.89 0.76 0.86 0.90  CALCIUM 7.3* 7.8* 7.6* 7.4* 7.8*  MG 1.8 1.9 1.5* 2.0 2.0  PHOS 2.3* 1.8* 2.8 2.5 2.7   Liver Function Tests: Recent Labs  Lab 07/08/24 0306 07/09/24 0334  AST 155* 64*  ALT 68* 45*  ALKPHOS 52 54  BILITOT 1.0 1.3*  PROT 4.5* 4.5*  ALBUMIN 2.7* 2.7*   No results for input(s): LIPASE, AMYLASE in the last 168 hours. Recent Labs  Lab 07/07/24 1330  AMMONIA 37*   CBC: Recent Labs  Lab 07/09/24 0334 07/10/24 0316 07/11/24 0527 07/12/24 0522 07/13/24 0618 07/14/24 0217  WBC 3.9* 3.0* 4.4 3.5* 4.5 5.5  NEUTROABS 2.9  --   --   --   --   --   HGB 9.9* 9.1* 9.4* 9.0* 8.8* 9.2*  HCT 27.2* 25.5* 25.8* 25.3* 25.2* 25.7*  MCV 106.7* 108.1* 106.2* 106.3* 108.6* 107.5*  PLT 59* 60* 76* 85* 118* 156   Cardiac  Enzymes: Recent Labs  Lab 07/08/24 0336 07/09/24 0334  CKTOTAL 474* 162   BNP: Invalid input(s): POCBNP CBG: No results for input(s): GLUCAP in the last 168 hours.  D-Dimer No results for input(s): DDIMER in the last 72 hours. Hgb A1c No results for input(s): HGBA1C in the last 72 hours. Lipid Profile No results for input(s): CHOL, HDL, LDLCALC, TRIG, CHOLHDL, LDLDIRECT in the last 72 hours. Thyroid function studies No results for input(s): TSH, T4TOTAL, T3FREE, THYROIDAB in the last 72 hours.  Invalid input(s): FREET3 Anemia work up No results for input(s): VITAMINB12, FOLATE, FERRITIN, TIBC, IRON, RETICCTPCT in the last 72 hours. Urinalysis    Component Value Date/Time   COLORURINE AMBER (A) 07/07/2024 1524   APPEARANCEUR CLOUDY (A) 07/07/2024 1524   LABSPEC 1.020 07/07/2024 1524   PHURINE 5.0 07/07/2024 1524   GLUCOSEU 50 (A) 07/07/2024 1524   HGBUR MODERATE (A) 07/07/2024 1524   BILIRUBINUR NEGATIVE 07/07/2024 1524   KETONESUR 20 (A) 07/07/2024 1524   PROTEINUR 30 (A) 07/07/2024 1524   UROBILINOGEN 0.2 04/27/2009 1425   NITRITE NEGATIVE 07/07/2024 1524   LEUKOCYTESUR NEGATIVE 07/07/2024 1524   Sepsis Labs Recent Labs  Lab 07/11/24 0527 07/12/24 0522 07/13/24 0618 07/14/24 0217  WBC 4.4 3.5* 4.5 5.5   Microbiology Recent Results (from the past 240 hours)  Resp panel by RT-PCR (RSV, Flu A&B, Covid) Anterior Nasal Swab     Status: None   Collection Time: 07/06/24 11:46 AM   Specimen: Anterior Nasal Swab  Result Value Ref Range Status   SARS Coronavirus 2 by RT PCR NEGATIVE NEGATIVE Final   Influenza A by PCR NEGATIVE NEGATIVE Final   Influenza B by PCR NEGATIVE NEGATIVE Final    Comment: (NOTE) The Xpert Xpress SARS-CoV-2/FLU/RSV plus assay is intended as an aid in the diagnosis of influenza from Nasopharyngeal swab specimens and should not be used as a sole basis for treatment. Nasal washings and aspirates are  unacceptable for Xpert Xpress SARS-CoV-2/FLU/RSV testing.  Fact Sheet for Patients: bloggercourse.com  Fact Sheet for Healthcare Providers: seriousbroker.it  This test is not yet approved or cleared by the [place]  FDA and has been authorized for detection and/or diagnosis of SARS-CoV-2 by FDA under an Emergency Use Authorization (EUA). This EUA will remain in effect (meaning this test can be used) for the duration of the COVID-19 declaration under Section 564(b)(1) of the Act, 21 U.S.C. section 360bbb-3(b)(1), unless the authorization is terminated or revoked.     Resp Syncytial Virus by PCR NEGATIVE NEGATIVE Final    Comment: (NOTE) Fact Sheet for Patients: bloggercourse.com  Fact Sheet for Healthcare Providers: seriousbroker.it  This test is not yet approved or cleared by the [place]  FDA and has been authorized for detection and/or diagnosis of SARS-CoV-2 by FDA under an Emergency Use Authorization (EUA). This EUA will remain in effect (meaning this test can be used) for the duration of the COVID-19 declaration under Section 564(b)(1) of the Act, 21 U.S.C. section 360bbb-3(b)(1), unless the authorization is terminated or revoked.  Performed at [facility], [address] [place], [place] [address]   Blood Culture (routine x 2)     Status: None   Collection Time: 07/06/24 11:51 AM   Specimen: BLOOD RIGHT ARM  Result Value Ref Range Status   Specimen Description BLOOD RIGHT ARM  Final   Special Requests   Final    BOTTLES DRAWN AEROBIC AND ANAEROBIC Blood Culture adequate volume   Culture   Final    NO GROWTH 5 DAYS Performed at [facility], [address] [place], [place] [address]    Report Status 07/11/2024 FINAL  Final  Blood Culture (routine  x 2)     Status: None   Collection Time: 07/06/24 12:18 PM   Specimen: BLOOD  Result Value Ref  Range Status   Specimen Description BLOOD  Final   Special Requests NONE  Final   Culture   Final    NO GROWTH 5 DAYS Performed at [facility], [address] [place], [place] [address]    Report Status 07/11/2024 FINAL  Final  MRSA Next Gen by PCR, Nasal     Status: None   Collection Time: 07/07/24  5:27 PM   Specimen: Nasal Mucosa; Nasal Swab  Result Value Ref Range Status   MRSA by PCR Next Gen NOT DETECTED NOT DETECTED Final    Comment: (NOTE) The GeneXpert MRSA Assay (FDA approved for NASAL specimens only), is one component of a comprehensive MRSA colonization surveillance program. It is not intended to diagnose MRSA infection nor to guide or monitor treatment for MRSA infections. Test performance is not FDA approved in patients less than 10 years old. Performed at [facility], [address], [place], [place] [address]   Respiratory (~20 pathogens) panel by PCR     Status: None   Collection Time: 07/08/24 10:13 AM   Specimen: Nasopharyngeal Swab; Respiratory  Result Value Ref Range Status   Adenovirus NOT DETECTED NOT DETECTED Final   Coronavirus 229E NOT DETECTED NOT DETECTED Final    Comment: (NOTE) The Coronavirus on the Respiratory Panel, DOES NOT test for the novel  Coronavirus (2019 nCoV)    Coronavirus HKU1 NOT DETECTED NOT DETECTED Final   Coronavirus NL63 NOT DETECTED NOT DETECTED Final   Coronavirus OC43 NOT DETECTED NOT DETECTED Final   Metapneumovirus NOT DETECTED NOT DETECTED Final   Rhinovirus / Enterovirus NOT DETECTED NOT DETECTED Final   Influenza A NOT DETECTED NOT DETECTED Final   Influenza B NOT DETECTED NOT DETECTED Final   Parainfluenza Virus 1 NOT DETECTED NOT DETECTED Final   Parainfluenza Virus 2 NOT DETECTED NOT DETECTED Final   Parainfluenza Virus 3 NOT DETECTED NOT DETECTED Final   Parainfluenza Virus 4 NOT DETECTED NOT DETECTED Final   Respiratory Syncytial Virus NOT DETECTED NOT DETECTED Final   Bordetella pertussis NOT  DETECTED NOT DETECTED Final   Bordetella Parapertussis NOT DETECTED NOT DETECTED Final   Chlamydophila pneumoniae NOT DETECTED NOT DETECTED Final   Mycoplasma pneumoniae NOT DETECTED NOT DETECTED Final    Comment: Performed at [facility], [address] [place], [place] [address]     Time coordinating discharge: Over 30 minutes  SIGNED:   Brayton Lye, MD  [facility] 07/14/2024, 11:42 AM Pager   If 7PM-7AM, please contact night-coverage [email or web address]     [1]  Allergies Allergen Reactions   Cardizem  [Diltiazem ] Swelling    Swelling in lower legs

## 2024-07-14 NOTE — Progress Notes (Signed)
 pt is more confused currently. Trying to ambulate in room, pulling off tele and attempting to get out of bed. Also not agreeable to taking oral meds currently states he's been drinking vodka and cannot take his meds. I crushed the meds and put them in pudding like normal but he spit it out stated he wanted to take it slow. I have attempted to reorient him multiple times with no success. MD notified and IM Haldol  prescribed

## 2024-07-14 NOTE — TOC Progression Note (Signed)
 Transition of Care [facility]) - Progression Note    Patient Details  Name: IVERSON SEES MRN: [id number] Date of Birth: 28-Apr-1946  [facility]) CM/SW Contact  Inocente GORMAN Kindle, LCSW Phone Number: 07/14/2024, 2:38 PM  Clinical Narrative:    CSW notified of cancelled discharge. CSW updated SNF and patient's grandson.    Expected Discharge Plan: Skilled Nursing Facility Barriers to Discharge: Barriers Resolved               Expected Discharge Plan and Services In-house Referral: Clinical Social Work   Post Acute Care Choice: Skilled Nursing Facility Living arrangements for the past 2 months: Single Family Home Expected Discharge Date: 07/14/24                                     Social Drivers of Health (SDOH) Interventions SDOH Screenings   Food Insecurity: No Food Insecurity (07/07/2024)  Housing: Low Risk (07/07/2024)  Transportation Needs: No Transportation Needs (07/07/2024)  Utilities: Not At Risk (07/07/2024)  Social Connections: Socially Isolated (07/07/2024)  Tobacco Use: Low Risk (07/09/2024)    Readmission Risk Interventions     No data to display

## 2024-07-14 NOTE — Plan of Care (Signed)
" °  Problem: Fluid Volume: Goal: Hemodynamic stability will improve Outcome: Progressing   Problem: Clinical Measurements: Goal: Diagnostic test results will improve Outcome: Progressing Goal: Signs and symptoms of infection will decrease Outcome: Progressing   Problem: Respiratory: Goal: Ability to maintain adequate ventilation will improve Outcome: Progressing   Problem: Safety: Goal: Non-violent Restraint(s) Outcome: Progressing   Problem: Education: Goal: Expressions of having a comfortable level of knowledge regarding the disease process will increase Outcome: Progressing   Problem: Coping: Goal: Ability to adjust to condition or change in health will improve Outcome: Progressing Goal: Ability to identify appropriate support needs will improve Outcome: Progressing   Problem: Health Behavior/Discharge Planning: Goal: Compliance with prescribed medication regimen will improve Outcome: Progressing   Problem: Medication: Goal: Risk for medication side effects will decrease Outcome: Progressing   Problem: Clinical Measurements: Goal: Complications related to the disease process, condition or treatment will be avoided or minimized Outcome: Progressing Goal: Diagnostic test results will improve Outcome: Progressing   Problem: Safety: Goal: Verbalization of understanding the information provided will improve Outcome: Progressing   Problem: Self-Concept: Goal: Level of anxiety will decrease Outcome: Progressing Goal: Ability to verbalize feelings about condition will improve Outcome: Progressing   Problem: Education: Goal: Knowledge of General Education information will improve Description: Including pain rating scale, medication(s)/side effects and non-pharmacologic comfort measures Outcome: Progressing   Problem: Health Behavior/Discharge Planning: Goal: Ability to manage health-related needs will improve Outcome: Progressing   Problem: Clinical  Measurements: Goal: Ability to maintain clinical measurements within normal limits will improve Outcome: Progressing Goal: Will remain free from infection Outcome: Progressing Goal: Diagnostic test results will improve Outcome: Progressing Goal: Respiratory complications will improve Outcome: Progressing Goal: Cardiovascular complication will be avoided Outcome: Progressing   Problem: Activity: Goal: Risk for activity intolerance will decrease Outcome: Progressing   Problem: Nutrition: Goal: Adequate nutrition will be maintained Outcome: Progressing   Problem: Coping: Goal: Level of anxiety will decrease Outcome: Progressing   Problem: Elimination: Goal: Will not experience complications related to bowel motility Outcome: Progressing Goal: Will not experience complications related to urinary retention Outcome: Progressing   Problem: Pain Managment: Goal: General experience of comfort will improve and/or be controlled Outcome: Progressing   Problem: Safety: Goal: Ability to remain free from injury will improve Outcome: Progressing   Problem: Skin Integrity: Goal: Risk for impaired skin integrity will decrease Outcome: Progressing   "

## 2024-07-14 NOTE — TOC Transition Note (Signed)
 Transition of Care [facility]) - Discharge Note   Patient Details  Name: Gabriel Everett MRN: [id number] Date of Birth: 02-Jun-1946  Transition of Care [facility]) CM/SW Contact:  Inocente GORMAN Kindle, LCSW Phone Number: 07/14/2024, 1:00 PM   Clinical Narrative:    Patient will DC to: [facility] Anticipated DC date: 07/14/24 Family notified: Darden Motto Transport by: [place]   Per MD patient ready for DC to [facility]. RN to call report prior to discharge [phone] room 123B). RN, patient, patient's family, and facility notified of DC. Discharge Summary and FL2 sent to facility. DC packet on chart. Ambulance transport requested for patient.   CSW will sign off for now as social work intervention is no longer needed. Please consult us  again if new needs arise.     Final next level of care: Skilled Nursing Facility Barriers to Discharge: Barriers Resolved   Patient Goals and CMS Choice Patient states their goals for this hospitalization and ongoing recovery are:: Rehab CMS Medicare.gov Compare Post Acute Care list provided to:: Patient Represenative (must comment) Choice offered to / list presented to :  [name] [employer] ownership interest in [facility].provided to::  [name]    Discharge Placement   Existing PASRR number confirmed : 07/14/24          Patient chooses bed at:  [facility]) Patient to be transferred to facility by: [facility] Name of family member notified: Grandson Patient and family notified of of transfer: 07/14/24  Discharge Plan and Services Additional resources added to the After Visit Summary for   In-house Referral: Clinical Social Work   Post Acute Care Choice: Skilled Nursing Facility                               Social Drivers of Health (SDOH) Interventions SDOH Screenings   Food Insecurity: No Food Insecurity (07/07/2024)  Housing: Low Risk (07/07/2024)  Transportation Needs: No Transportation Needs (07/07/2024)   Utilities: Not At Risk (07/07/2024)  Social Connections: Socially Isolated (07/07/2024)  Tobacco Use: Low Risk (07/09/2024)     Readmission Risk Interventions     No data to display

## 2024-07-14 NOTE — Progress Notes (Signed)
 Modified Barium Swallow Study  Patient Details  Name: Gabriel Everett MRN: [id number] Date of Birth: 1946/06/14  Today's Date: 07/14/2024  Modified Barium Swallow completed.  Full report located under Chart Review in the Imaging Section.  History of Present Illness Gabriel Everett is a 79 y.o. male who presented to ED 1/18 by EMS with altered mental status and weakness. Seizure activeity en route to ED lasting 1 min. Patient was alone and at baseline is able to be independent.  His grandson does come to check on him every now and then.  Found him on the floor yesterday.  Tried to care for him overnight but patient was getting up every hour and remained confused and had several more falls.  EMS was called this morning. Also reported he was diagnosed/treated for the flu about a week ago. MRI 1/18 with no acute findings.  Chest CT 1/18 with no evidence of infection. No prior ST eval/treat in EMR. Pt with with medical history significant of hypertension, atrial fibrillation, alcohol  use.   Clinical Impression Pt exhibits mild oropharyngeal dysphagia characterized by impaired efficiency. There is a collection of residue throughout the oropharynx regardless of consistency, which is affected by a prominent cricopharyngeus. Multiple swallows are beneficial. Oral transit is slightly disorganized, specifically with liquids, but he masticates regular solids thorougly. Epiglottic inversion and laryngeal elevation are complete with no penetration/aspiration. He took the 13 mm barium tablet with thin liquids, requiring mulitple sips to clear it from his oral cavity before it cleared promptly from the pharynx and esohpagus. Will advance to Dys 3 solids and thin liquids. Given persisting confusion, full supervision is recommended. Will f/u.   DIGEST Swallow Severity Rating*  Safety: 0  Efficiency: 1  Overall Pharyngeal Swallow Severity: 1 (mild) 1: mild; 2: moderate; 3: severe; 4: profound  *The Dynamic  Imaging Grade of Swallowing Toxicity is standardized for the head and neck cancer population, however, demonstrates promising clinical applications across populations to standardize the clinical rating of pharyngeal swallow safety and severity.  Factors that may increase risk of adverse event in presence of aspiration Noe & Lianne 2021): Reduced cognitive function;Limited mobility;Frail or deconditioned  Swallow Evaluation Recommendations Recommendations: PO diet PO Diet Recommendation: Dysphagia 3 (Mechanical soft);Thin liquids (Level 0) Liquid Administration via: Cup;Straw Medication Administration: Whole meds with liquid Supervision: Staff to assist with self-feeding;Intermittent supervision/cueing for swallowing strategies Swallowing strategies  : Minimize environmental distractions;Slow rate;Small bites/sips Postural changes: Position pt fully upright for meals Oral care recommendations: Oral care BID (2x/day)   Damien Blumenthal, M.A., CCC-SLP Speech Language Pathology, Acute Rehabilitation Services  Secure Chat preferred [phone]  07/14/2024,9:49 AM

## 2024-07-14 NOTE — Progress Notes (Signed)
 " PROGRESS NOTE    Gabriel Everett  FMW:[id number] DOB: January 01, 1946 DOA: 07/06/2024 PCP: [name], FNP   Brief Narrative:  79 year old with past medical history significant for hypertension, A-fib, alcohol  use presented with altered mental status and weakness.  Patient was found down on the floor by family.  EMS was contacted.  Patient was noted to be confused.  Patient was recently treated for the flu a week ago.  Patient was noted to have multiple skin tears.  On EMS evaluation initial blood pressure was in the 70s.  Improved with IV fluids.  Patient and route had seizure lasting 1 minute with tonic-clonic activity.  Evaluation in the ED presented with a creatinine of 2.7 baseline of 1, bilirubin 3.3, white blood cell 11, hemoglobin 11, platelets 108, INR 1.5, lactic acid 7 subsequently repeated 5.  Respiratory panel, flu COVID RSV negative.  Chest x-ray showed no acute abnormality.  CT head showed no acute abnormality.  CT of chest abdomen and pelvis showed no acute abnormality but did show some hepatic steatosis   Assessment & Plan:     Acute metabolic encephalopathy Severe dehydration Alcohol  withdrawal with DTs -Was found down by family on the floor, has been laying down for few hours, workup on admission significant for severe dehydration and significant electrolyte derangements.  And mild rhabdomyolysis. -questionable history of alcohol  withdrawals versus seizure -  MRI brain and EEG nonacute, he denies any headache, he appears severely dehydrated with severe electrolyte abnormalities - PT/OT input appreciated, will need SNF placement  - Was in acute withdrawals, this has much improved.    Hypertension Orthostasis - Pressure initially soft, started to increase, resume home regimen on propranolol . - Patient with PT today was noted to be significantly orthostatic, quite symptomatic with dizziness, he was reevaluated, sitting up blood pressure acceptable 136/75, but upon  standing up came down to 70/51. -Will start on low-dose midodrine , continue with IV fluids, will change propranolol  to metoprolol  (still needing an agent to control heart rate in the setting of A-fib) . - Will hold his discharge.  alcohol  abuse Alcohol   withdrawal -Drinks on average 4 shots per day pressure grandson at bedside - On CIWA protocol for DTs, as well on scheduled low dose Librium , did not require any for last couple days,. - He was treated with high-dose thiamine  during hospital stay, will discharge on thiamine , folic acid  and multivitamins - Borderline B12, continue with B12 and folic acid  on discharge.       AKI Mild rhabdo Metabolic acidosis with elevated anion gap -AKI on presentation, with elevated total CK, with evidence of urine dipstick for blood while no RBCs seen in hpf.  Total CK has normalized. - Resolved with IV fluids     Hypokalemia Hypomagnesemia Hypophosphatemia - This was replaced, normalized on discharge   SIRS Hypothermia Possible UTI Due to being exposed to elements and down for several hours, supportive care, TSH stable, B12 borderline low which will be replaced, no signs of infectious focus sipped possible mild UTI.  Finished 5 days of Rocephin  for UTI   Coffee Ground emesis;  Severe esophagitis Odynophagia Dysphagia - Questionable dark emesis was noted by family, during hospital stay there was no  further evidence of coffee-ground emesis or melena or significant GI bleed. - Patient reports some difficulty/pain swallowing, likely underlying fasciitis with his history of heavy alcohol  abuse, no acute finding on CT chest/abdomen/pelvis - Hemoglobin has trended down slowly, but this is most likely delusional effect,. - Santina  for MBS today, no difficulty swallowing his barium tablets or acute abnormalities found on the study. - Started on IV Protonix  40 mg p.o. twice daily, and Carafate , symptoms much improved, will discontinue as an outpatient,  recommended for the patient to follow-up with GI as an outpatient once stable  A-fib - Back on beta-blockers -Was not on anticoagulation prior to admission, and I will refrain from starting in the setting of his falls, thrombocytopenia.   Thrombocytopenia - could be related to infectious process versus alcohol .  Improving   Mild asymptomatic transaminitis. - Negative hepatitis panel - Ultrasound significant for hepatic steatosis - This is secondary to alcohol  abu     Estimated body mass index is 22.24 kg/m as calculated from the following:   Height as of this encounter: 5' 10 (1.778 m).   Weight as of this encounter: 70.3 kg.   DVT prophylaxis: SCD, [place] heparin  Code Status: Full code   Family Communication: Discussed with grandson at bedside Disposition Plan: To SNF, likely early next week once over his withdrawals Status is: Inpatient Remains inpatient appropriate because: Management of acute encephalopathy AKI    Consultants:  None  Procedures:  none  Antimicrobials:    Subjective:  No complaints, report deferred nephrology has improved, he was significantly symptomatic with dizziness upon standing up.  Objective: Vitals:   07/14/24 0820 07/14/24 1049 07/14/24 1050 07/14/24 1148  BP:   (!) 114/52 114/61  Pulse:   70 63  Resp:      Temp: (!) 97.5 F (36.4 C) 98.5 F (36.9 C)  98.7 F (37.1 C)  TempSrc: Oral Oral  Oral  SpO2:      Weight:      Height:        Intake/Output Summary (Last 24 hours) at 07/14/2024 1431 Last data filed at 07/14/2024 1200 Gross per 24 hour  Intake --  Output 500 ml  Net -500 ml   Filed Weights   07/06/24 1136  Weight: 70.3 kg    Examination:      General: Pt is alert, awake, not in acute distress, frail, deconditioned Cardiovascular: irregular, S1/S2 +, no rubs, no gallops Respiratory: CTA bilaterally, no wheezing, no rhonchi Abdominal: Soft, NT, ND, bowel sounds + Extremities: no edema, no cyanosis     Data  Review:   Patient Lines/Drains/Airways Status     Active Line/Drains/Airways     Name Placement date Placement time Site Days   External Urinary Catheter 07/07/24  1307  --  7   Wound 07/07/24 1246 Other (Comment) Arm Lower;Posterior;Right 07/07/24  1246  Arm  7   Wound 07/07/24 1247 Hand Left;Posterior 07/07/24  1247  Hand  7   Wound 07/07/24 1250 Pressure Injury Buttocks Deep Tissue Pressure Injury - Purple or maroon localized area of discolored intact skin or blood-filled blister due to damage of underlying soft tissue from pressure and/or shear. 07/07/24  1250  Buttocks  7   Wound 07/07/24 1300 Traumatic Arm Left;Posterior;Upper 07/07/24  1300  Arm  7   Wound 07/07/24 1300 Traumatic Arm Left;Lower;Posterior;Proximal 07/07/24  1300  Arm  7             Inpatient Medications  Scheduled Meds:  vitamin B-12  1,000 mcg Oral Daily   feeding supplement  237 mL Oral BID BM   folic acid   1 mg Oral Daily   LORazepam   0-4 mg Intravenous Q8H   LORazepam   2 mg Intravenous Once   magic mouthwash  10 mL Oral  QID   metoprolol  tartrate  25 mg Oral BID   midodrine   10 mg Oral Once   [START ON 07/15/2024] midodrine   5 mg Oral TID WC   multivitamin with minerals  1 tablet Oral Daily   pantoprazole  (PROTONIX ) IV  40 mg Intravenous Q12H   phosphorus  500 mg Oral BID   sucralfate   1 g Oral TID WC & HS   Continuous Infusions:  lactated ringers      thiamine  (VITAMIN B1) injection 500 mg (07/13/24 2119)   PRN Meds:.acetaminophen  **OR** acetaminophen , ipratropium-albuterol , metoprolol  tartrate, polyethylene glycol  DVT Prophylaxis   Recent Labs  Lab 07/09/24 0334 07/10/24 0316 07/11/24 0527 07/12/24 0522 07/13/24 0618 07/14/24 0217  WBC 3.9* 3.0* 4.4 3.5* 4.5 5.5  HGB 9.9* 9.1* 9.4* 9.0* 8.8* 9.2*  HCT 27.2* 25.5* 25.8* 25.3* 25.2* 25.7*  PLT 59* 60* 76* 85* 118* 156  MCV 106.7* 108.1* 106.2* 106.3* 108.6* 107.5*  MCH 38.8* 38.6* 38.7* 37.8* 37.9* 38.5*  MCHC 36.4* 35.7 36.4* 35.6  34.9 35.8  RDW 13.3 13.6 13.2 13.4 13.9 13.7  LYMPHSABS 0.7  --   --   --   --   --   MONOABS 0.3  --   --   --   --   --   EOSABS 0.0  --   --   --   --   --   BASOSABS 0.0  --   --   --   --   --     Recent Labs  Lab 07/08/24 0306 07/08/24 0306 07/08/24 0609 07/09/24 0334 07/10/24 0316 07/11/24 0527 07/12/24 0522 07/13/24 0618 07/14/24 0217  NA 138  --   --  136 139 139 138 136 137  K 2.8*  --   --  4.0 3.5 3.3* 3.3* 4.0 3.7  CL 99  --   --  102 103 106 105 107 108  CO2 26  --   --  28 24 23 23  21* 20*  ANIONGAP 13  --   --  7 11 10 10 8 8   GLUCOSE 177*  --   --  127* 95 94 86 95 99  BUN 38*  --   --  17 16 14 10 11 14   CREATININE 1.67*  --   --  0.96 0.89 0.89 0.76 0.86 0.90  AST 155*  --   --  64*  --   --   --   --   --   ALT 68*  --   --  45*  --   --   --   --   --   ALKPHOS 52  --   --  54  --   --   --   --   --   BILITOT 1.0  --   --  1.3*  --   --   --   --   --   ALBUMIN 2.7*  --   --  2.7*  --   --   --   --   --   CRP  --   --  1.3*  --   --   --   --   --   --   PROCALCITON  --   --  1.29  --   --   --   --   --   --   MG  --    < > 1.3* 1.7 1.8 1.9 1.5* 2.0 2.0  PHOS  --    < >  --  <  1.0* 2.3* 1.8* 2.8 2.5 2.7  CALCIUM 7.3*  --   --  7.4* 7.3* 7.8* 7.6* 7.4* 7.8*   < > = values in this interval not displayed.      Recent Labs  Lab 07/08/24 [phone] 07/09/24 0334 07/10/24 0316 07/11/24 0527 07/12/24 0522 07/13/24 0618 07/14/24 0217  CRP 1.3*  --   --   --   --   --   --   PROCALCITON 1.29  --   --   --   --   --   --   MG 1.3*   < > 1.8 1.9 1.5* 2.0 2.0  CALCIUM  --    < > 7.3* 7.8* 7.6* 7.4* 7.8*   < > = values in this interval not displayed.    --------------------------------------------------------------------------------------------------------------- Lab Results  Component Value Date   CHOL 153 11/24/2023   HDL 52 11/24/2023   LDLCALC 78 11/24/2023   TRIG 114 11/24/2023   CHOLHDL 2.9 11/24/2023    Lab Results  Component Value Date    HGBA1C 4.6 (L) 11/23/2023   No results for input(s): TSH, T4TOTAL, FREET4, T3FREE, THYROIDAB in the last 72 hours.  No results for input(s): VITAMINB12, FOLATE, FERRITIN, TIBC, IRON, RETICCTPCT in the last 72 hours.  ------------------------------------------------------------------------------------------------------------------ Cardiac Enzymes No results for input(s): CKMB, TROPONINI, MYOGLOBIN in the last 168 hours.  Invalid input(s): CK  Micro Results Recent Results (from the past 240 hours)  Resp panel by RT-PCR (RSV, Flu A&B, Covid) Anterior Nasal Swab     Status: None   Collection Time: 07/06/24 11:46 AM   Specimen: Anterior Nasal Swab  Result Value Ref Range Status   SARS Coronavirus 2 by RT PCR NEGATIVE NEGATIVE Final   Influenza A by PCR NEGATIVE NEGATIVE Final   Influenza B by PCR NEGATIVE NEGATIVE Final    Comment: (NOTE) The Xpert Xpress SARS-CoV-2/FLU/RSV plus assay is intended as an aid in the diagnosis of influenza from Nasopharyngeal swab specimens and should not be used as a sole basis for treatment. Nasal washings and aspirates are unacceptable for Xpert Xpress SARS-CoV-2/FLU/RSV testing.  Fact Sheet for Patients: bloggercourse.com  Fact Sheet for Healthcare Providers: seriousbroker.it  This test is not yet approved or cleared by the [place]  FDA and has been authorized for detection and/or diagnosis of SARS-CoV-2 by FDA under an Emergency Use Authorization (EUA). This EUA will remain in effect (meaning this test can be used) for the duration of the COVID-19 declaration under Section 564(b)(1) of the Act, 21 U.S.C. section 360bbb-3(b)(1), unless the authorization is terminated or revoked.     Resp Syncytial Virus by PCR NEGATIVE NEGATIVE Final    Comment: (NOTE) Fact Sheet for Patients: bloggercourse.com  Fact Sheet for Healthcare  Providers: seriousbroker.it  This test is not yet approved or cleared by the [place]  FDA and has been authorized for detection and/or diagnosis of SARS-CoV-2 by FDA under an Emergency Use Authorization (EUA). This EUA will remain in effect (meaning this test can be used) for the duration of the COVID-19 declaration under Section 564(b)(1) of the Act, 21 U.S.C. section 360bbb-3(b)(1), unless the authorization is terminated or revoked.  Performed at [facility], [address] [place], [place] [address]   Blood Culture (routine x 2)     Status: None   Collection Time: 07/06/24 11:51 AM   Specimen: BLOOD RIGHT ARM  Result Value Ref Range Status   Specimen Description BLOOD RIGHT ARM  Final   Special Requests  Final    BOTTLES DRAWN AEROBIC AND ANAEROBIC Blood Culture adequate volume   Culture   Final    NO GROWTH 5 DAYS Performed at [facility], [address] [place], [place] [address]    Report Status 07/11/2024 FINAL  Final  Blood Culture (routine x 2)     Status: None   Collection Time: 07/06/24 12:18 PM   Specimen: BLOOD  Result Value Ref Range Status   Specimen Description BLOOD  Final   Special Requests NONE  Final   Culture   Final    NO GROWTH 5 DAYS Performed at [facility], [address], [place], [place] [address]    Report Status 07/11/2024 FINAL  Final  MRSA Next Gen by PCR, Nasal     Status: None   Collection Time: 07/07/24  5:27 PM   Specimen: Nasal Mucosa; Nasal Swab  Result Value Ref Range Status   MRSA by PCR Next Gen NOT DETECTED NOT DETECTED Final    Comment: (NOTE) The GeneXpert MRSA Assay (FDA approved for NASAL specimens only), is one component of a comprehensive MRSA colonization surveillance program. It is not intended to diagnose MRSA infection nor to guide or monitor treatment for MRSA infections. Test performance is not FDA approved in patients less than 12 years old. Performed at [facility], [address], [place], [place] [address]   Respiratory (~20 pathogens) panel by PCR     Status: None   Collection Time: 07/08/24 10:13 AM   Specimen: Nasopharyngeal Swab; Respiratory  Result Value Ref Range Status   Adenovirus NOT DETECTED NOT DETECTED Final   Coronavirus 229E NOT DETECTED NOT DETECTED Final    Comment: (NOTE) The Coronavirus on the Respiratory Panel, DOES NOT test for the novel  Coronavirus (2019 nCoV)    Coronavirus HKU1 NOT DETECTED NOT DETECTED Final   Coronavirus NL63 NOT DETECTED NOT DETECTED Final   Coronavirus OC43 NOT DETECTED NOT DETECTED Final   Metapneumovirus NOT DETECTED NOT DETECTED Final   Rhinovirus / Enterovirus NOT DETECTED NOT DETECTED Final   Influenza A NOT DETECTED NOT DETECTED Final   Influenza B NOT DETECTED NOT DETECTED Final   Parainfluenza Virus 1 NOT DETECTED NOT DETECTED Final   Parainfluenza Virus 2 NOT DETECTED NOT DETECTED Final   Parainfluenza Virus 3 NOT DETECTED NOT DETECTED Final   Parainfluenza Virus 4 NOT DETECTED NOT DETECTED Final   Respiratory Syncytial Virus NOT DETECTED NOT DETECTED Final   Bordetella pertussis NOT DETECTED NOT DETECTED Final   Bordetella Parapertussis NOT DETECTED NOT DETECTED Final   Chlamydophila pneumoniae NOT DETECTED NOT DETECTED Final   Mycoplasma pneumoniae NOT DETECTED NOT DETECTED Final    Comment: Performed at [facility], [address] [place], [place] [address]    Radiology Reports  No results found.     Signature  -   Brayton Lye M.D on 07/14/2024 at 2:31 PM   -  To page go to [email or web address]       07/14/2024, 2:31 PM   "

## 2024-07-14 NOTE — Progress Notes (Signed)
 Physical Therapy Treatment Patient Details Name: PATSY VARMA MRN: [id number] DOB: 11-08-1945 Today's Date: 07/14/2024   History of Present Illness 79 y.o. male presents 07/06/24 with AMS and weakness. Possible seizure activity en route to ED.  Found to have hypotension, AKI, elevated lactic acid, mild leukocytosis. No source for infection, concern for SIRS/sepsis versus seizure. EEG study results within normal limits.   PMH: HTN, afib, and alcohol  use.    PT Comments  Patient progressing slowly due to orthostatic in sitting and symptomatic feeling weak and dizzy.  Patient able to take steps forward though limited due to symptoms so BP checked in sitting and was 77/61.  Also needing to have BM which was runny.  Feel he may need to address these issues to be able to progress mobility.  PT will continue to follow.     If plan is discharge home, recommend the following: A little help with walking and/or transfers;A little help with bathing/dressing/bathroom;Assistance with cooking/housework;Direct supervision/assist for medications management;Direct supervision/assist for financial management;Assist for transportation;Help with stairs or ramp for entrance;Supervision due to cognitive status   Can travel by private vehicle        Equipment Recommendations  Wheelchair (measurements PT);Wheelchair cushion (measurements PT);BSC/3in1    Recommendations for Other Services       Precautions / Restrictions Precautions Precautions: Fall Precaution/Restrictions Comments: Seizure     Mobility  Bed Mobility Overal bed mobility: Needs Assistance Bed Mobility: Supine to Sit, Sit to Supine     Supine to sit: HOB elevated, Min assist Sit to supine: Min assist   General bed mobility comments: assist for lifting trunk to sit to lift legs onto bed to supine    Transfers Overall transfer level: Needs assistance Equipment used: Rolling walker (2 wheels)   Sit to Stand: Min assist, From  elevated surface   Step pivot transfers: Min assist       General transfer comment: up to stand from EOB with A for balance pt with flexed posture, unable to take more than a couple steps and reported feeling weak so assisted to back up to sit on chair, then up to [facility] then back to bed due to BP low    Ambulation/Gait Ambulation/Gait assistance: Min assist Gait Distance (Feet): 2 Feet Assistive device: Rolling walker (2 wheels) Gait Pattern/deviations: Step-to pattern, Decreased stride length, Shuffle, Trunk flexed       General Gait Details: forward about 2 steps then backing up to recliner with RW and A for balance and cues for backing up   Stairs             Wheelchair Mobility     Tilt Bed    Modified Rankin (Stroke Patients Only)       Balance Overall balance assessment: Needs assistance Sitting-balance support: Feet supported Sitting balance-Leahy Scale: Fair     Standing balance support: Bilateral upper extremity supported Standing balance-Leahy Scale: Poor Standing balance comment: A for balance in standing with trunk flexion and UE support                            Communication Communication Communication: No apparent difficulties  Cognition Arousal: Alert Behavior During Therapy: Flat affect                             Following commands: Intact      Cueing    Exercises  General Comments General comments (skin integrity, edema, etc.): HR max 130's with mobility, BP 77/61 seated on BSC, was 105/55 back in supine, toileted on BSC with pale yellow liquid stool, RN Aware; assist for hygiene in standing      Pertinent Vitals/Pain Pain Assessment Pain Assessment: Faces Faces Pain Scale: Hurts a little bit Pain Location: reports burning when trying to eat Pain Descriptors / Indicators: Burning Pain Intervention(s): Monitored during session    Home Living                          Prior Function             PT Goals (current goals can now be found in the care plan section) Progress towards PT goals: Not progressing toward goals - comment    Frequency    Min 2X/week      PT Plan      Co-evaluation              AM-PAC PT 6 Clicks Mobility   Outcome Measure  Help needed turning from your back to your side while in a flat bed without using bedrails?: A Little Help needed moving from lying on your back to sitting on the side of a flat bed without using bedrails?: A Little Help needed moving to and from a bed to a chair (including a wheelchair)?: A Little Help needed standing up from a chair using your arms (e.g., wheelchair or bedside chair)?: A Little Help needed to walk in hospital room?: Total Help needed climbing 3-5 steps with a railing? : Total 6 Click Score: 14    End of Session Equipment Utilized During Treatment: Gait belt Activity Tolerance: Patient limited by fatigue Patient left: in bed;with call bell/phone within reach;with bed alarm set   PT Visit Diagnosis: Unsteadiness on feet (R26.81);Muscle weakness (generalized) (M62.81)     Time: 8859-8784 PT Time Calculation (min) (ACUTE ONLY): 35 min  Charges:    $Therapeutic Activity: 23-37 mins PT General Charges $$ ACUTE PT VISIT: 1 Visit                     Micheline Portal, PT Acute Rehabilitation Services Office:[phone] 07/14/2024    Montie Portal 07/14/2024, 1:56 PM

## 2024-07-14 NOTE — TOC Progression Note (Signed)
 Transition of Care [facility]) - Progression Note    Patient Details  Name: Gabriel Everett MRN: [id number] Date of Birth: [birth year]  [facility] CM/SW Contact  Inocente GORMAN [place], [place] Phone Number: 07/14/2024, 11:37 AM  Clinical Narrative:    [facility] able to accept patient today ([facility] unable). CSW updated patient's grandson who reported agreement with [facility] for transport.    Expected Discharge Plan: Skilled Nursing Facility Barriers to Discharge: Continued Medical Work up, English As A Second Language Teacher, SNF Pending bed offer               Expected Discharge Plan and Services In-house Referral: Clinical Social Work   Post Acute Care Choice: Skilled Nursing Facility Living arrangements for the past 2 months: Single Family Home Expected Discharge Date: 07/14/24                                     Social Drivers of Health (SDOH) Interventions SDOH Screenings   Food Insecurity: No Food Insecurity (07/07/2024)  Housing: Low Risk (07/07/2024)  Transportation Needs: No Transportation Needs (07/07/2024)  Utilities: Not At Risk (07/07/2024)  Social Connections: Socially Isolated (07/07/2024)  Tobacco Use: Low Risk (07/09/2024)    Readmission Risk Interventions     No data to display

## 2024-07-14 NOTE — Plan of Care (Signed)
" °  Problem: Clinical Measurements: Goal: Diagnostic test results will improve Outcome: Progressing   Problem: Clinical Measurements: Goal: Will remain free from infection Outcome: Progressing Goal: Diagnostic test results will improve Outcome: Progressing Goal: Respiratory complications will improve Outcome: Progressing Goal: Cardiovascular complication will be avoided Outcome: Progressing   Problem: Activity: Goal: Risk for activity intolerance will decrease Outcome: Progressing   "

## 2024-07-15 DIAGNOSIS — G9340 Encephalopathy, unspecified: Secondary | ICD-10-CM | POA: Diagnosis not present

## 2024-07-15 LAB — BASIC METABOLIC PANEL WITH GFR
Anion gap: 13 (ref 5–15)
BUN: 12 mg/dL (ref 8–23)
CO2: 19 mmol/L — ABNORMAL LOW (ref 22–32)
Calcium: 8.2 mg/dL — ABNORMAL LOW (ref 8.9–10.3)
Chloride: 106 mmol/L (ref 98–111)
Creatinine, Ser: 0.89 mg/dL (ref 0.61–1.24)
GFR, Estimated: >60 mL/min
Glucose, Bld: 93 mg/dL (ref 70–99)
Potassium: 3.3 mmol/L — ABNORMAL LOW (ref 3.5–5.1)
Sodium: 138 mmol/L (ref 135–145)

## 2024-07-15 LAB — MAGNESIUM: Magnesium: 1.8 mg/dL (ref 1.7–2.4)

## 2024-07-15 LAB — CBC
HCT: 28.2 % — ABNORMAL LOW (ref 39.0–52.0)
Hemoglobin: 9.9 g/dL — ABNORMAL LOW (ref 13.0–17.0)
MCH: 37.8 pg — ABNORMAL HIGH (ref 26.0–34.0)
MCHC: 35.1 g/dL (ref 30.0–36.0)
MCV: 107.6 fL — ABNORMAL HIGH (ref 80.0–100.0)
Platelets: 253 10*3/uL (ref 150–400)
RBC: 2.62 MIL/uL — ABNORMAL LOW (ref 4.22–5.81)
RDW: 13.1 % (ref 11.5–15.5)
WBC: 8.7 10*3/uL (ref 4.0–10.5)
nRBC: 0 % (ref 0.0–0.2)

## 2024-07-15 LAB — PHOSPHORUS: Phosphorus: 2.6 mg/dL (ref 2.5–4.6)

## 2024-07-15 MED ORDER — QUETIAPINE FUMARATE 25 MG PO TABS
25.0000 mg | ORAL_TABLET | Freq: Two times a day (BID) | ORAL | Status: DC
  Administered 2024-07-15 – 2024-07-16 (×3): 25 mg via ORAL
  Filled 2024-07-15 (×3): qty 1

## 2024-07-15 MED ORDER — THIAMINE MONONITRATE 100 MG PO TABS
100.0000 mg | ORAL_TABLET | Freq: Every day | ORAL | Status: DC
  Administered 2024-07-16: 100 mg via ORAL
  Filled 2024-07-15: qty 1

## 2024-07-15 MED ORDER — METOPROLOL TARTRATE 25 MG PO TABS: 37.5000 mg | ORAL_TABLET | Freq: Two times a day (BID) | ORAL | Status: DC

## 2024-07-15 MED ORDER — POTASSIUM CHLORIDE CRYS ER 20 MEQ PO TBCR
40.0000 meq | EXTENDED_RELEASE_TABLET | Freq: Four times a day (QID) | ORAL | Status: AC
  Administered 2024-07-15 (×2): 40 meq via ORAL
  Filled 2024-07-15 (×2): qty 2

## 2024-07-15 MED ORDER — METOPROLOL TARTRATE 25 MG PO TABS
37.5000 mg | ORAL_TABLET | Freq: Two times a day (BID) | ORAL | Status: DC
  Administered 2024-07-15 – 2024-07-16 (×3): 37.5 mg via ORAL
  Filled 2024-07-15 (×3): qty 1

## 2024-07-15 MED ORDER — PANTOPRAZOLE SODIUM 40 MG PO TBEC
40.0000 mg | DELAYED_RELEASE_TABLET | Freq: Two times a day (BID) | ORAL | Status: DC
  Administered 2024-07-15 – 2024-07-16 (×2): 40 mg via ORAL
  Filled 2024-07-15 (×2): qty 1

## 2024-07-15 MED ORDER — SODIUM CHLORIDE 0.9 % IV SOLN: INTRAVENOUS | Status: AC

## 2024-07-15 NOTE — Progress Notes (Incomplete)
 "                        PROGRESS NOTE        PATIENT DETAILS Name: Gabriel Everett Age: 79 y.o. Sex: male Date of Birth: Dec 21, 1945 Admit Date: 07/06/2024 Admitting Physician Marsa KATHEE Scurry, MD ERE:Xpmx, Otto, FNP  Brief Summary: Patient is a 79 y.o.  male with a history of HTN, A-fib, EtOH use-recent history of influenza (1 week prior to admission) found down on the floor by family-found to be confused-multiple skin tears-EMS was called-initial blood pressure in the 70s systolic-improved with IV fluids-patient had a seizure and route to the hospital-upon further evaluation-found to have AKI-mild rhabdomyolysis and subsequently admitted to the hospitalist service.  Significant events: 1/18>> admit to [facility] 1/26>> discharged to SNF-however found to have orthostatic hypotension-hence discharge held.  More delirium overnight 1/27 >> started on Seroquel .  Significant studies: 1/18>> chest x-ray: No PNA 1/18>> CT chest/abdomen/pelvis: No evidence of infection in the chest/abdomen or pelvis. 1/18>> MRI brain:No acute intracranial abnormality. 1/18>> EEG: No seizures. 1/20>> RUQ ultrasound: Hepatic steatosis-6 mm gallbladder polyp.  Significant microbiology data: 1/18>> COVID/influenza/RSV PCR: Negative 1/18>> blood culture: No growth 1/20>> respiratory virus panel: Negative  Procedures: None  Consults: None  Subjective: Lying comfortably in bed-denies any chest pain or shortness of breath.  Objective: Vitals: Blood pressure (!) 89/46, pulse (!) 59, temperature 97.6 F (36.4 C), temperature source Axillary, resp. rate 17, height 5' 10 (1.778 m), weight 70.3 kg, SpO2 98%.   Exam: Gen Exam:Alert awake-not in any distress HEENT:atraumatic, normocephalic Chest: B/L clear to auscultation anteriorly CVS:S1S2 regular Abdomen:soft non tender, non distended Extremities:no edema Neurology: Non focal Skin: no rash  Pertinent Labs/Radiology:    Latest Ref Rng & Units  07/15/2024    5:00 AM 07/14/2024    2:17 AM 07/13/2024    6:18 AM  CBC  WBC 4.0 - 10.5 K/uL 8.7  5.5  4.5   Hemoglobin 13.0 - 17.0 g/dL 9.9  9.2  8.8   Hematocrit 39.0 - 52.0 % 28.2  25.7  25.2   Platelets 150 - 400 K/uL 253  156  118     Lab Results  Component Value Date   NA 138 07/15/2024   K 3.3 (L) 07/15/2024   CL 106 07/15/2024   CO2 19 (L) 07/15/2024      Assessment/Plan: Acute metabolic encephalopathy Felt to be secondary to AKI/EtOH withdrawal Overall improved-however further hospital course complicated by hospital delirium Continue Seroquel  Maintain delirium precautions  Hypotension Initially-per EMS-hypotensive in the field-likely secondary to dehydration Overall blood pressure much better but now has orthostatic hypotension  Orthostatic hypotension Continue midodrine /IVF If continues-check cortisol levels.***  AKI Likely hemodynamically mediated Resolved with IV fluids  Rhabdomyolysis Secondary to being down on the floor for several hours Resolved with IVF.  Asymptomatic bacteriuria versus UTI Cultures never sent-UA equivocal Already treated with Rocephin  x 5 days.  SIRS Likely noninfectious-probably hypothermia was due to environmental exposure-being down on the floor for several hours Supportive care.  ?  Coffee-ground emesis This was noted by family-during this hospitalization-there was no evidence of coffee-ground emesis/melena or any evidence of GI bleeding-Hb has been stable for the past several days (slightly worse than on initial presentation due to IVF dilutions and critical illness) Continue PPI/Carafate  Follow-up in GI postdischarge.  Paroxysmal atrial fibrillation Continue telemetry monitoring On beta-blocker Previously/prior to hospitalization-not on anticoagulation-probably a good idea to keep him off anticoagulation given history of  EtOH use/falls.  Pressure Ulcer: Agree with assessment as outlined below. Wound 07/07/24 1250  Pressure Injury Buttocks Deep Tissue Pressure Injury - Purple or maroon localized area of discolored intact skin or blood-filled blister due to damage of underlying soft tissue from pressure and/or shear. (Active)    Code status:   Code Status: Full Code   DVT Prophylaxis:*** Place TED hose Start: 07/15/24 0705 heparin  injection 5,000 Units Start: 07/14/24 1530 Place TED hose Start: 07/14/24 1431 Place and maintain sequential compression device Start: 07/08/24 1009   Family Communication: None at bedside   Disposition Plan: Status is: Inpatient Remains inpatient appropriate because: Severity of illness   Planned Discharge Destination:Skilled nursing facility   Diet: Diet Order             DIET DYS 3 Room service appropriate? No; Fluid consistency: Thin  Diet effective now                     Antimicrobial agents: Anti-infectives (From admission, onward)    Start     Dose/Rate Route Frequency Ordered Stop   07/08/24 1045  cefTRIAXone  (ROCEPHIN ) 1 g in sodium chloride  0.9 % 100 mL IVPB  Status:  Discontinued        1 g 200 mL/hr over 30 Minutes Intravenous Every 24 hours 07/08/24 0958 07/10/24 0853   07/07/24 1245  ceFEPIme  (MAXIPIME ) 2 g in sodium chloride  0.9 % 100 mL IVPB  Status:  Discontinued        2 g 200 mL/hr over 30 Minutes Intravenous Every 24 hours 07/06/24 1254 07/07/24 0953   07/07/24 1000  cefTRIAXone  (ROCEPHIN ) 2 g in sodium chloride  0.9 % 100 mL IVPB  Status:  Discontinued        2 g 200 mL/hr over 30 Minutes Intravenous Every 24 hours 07/07/24 0953 07/08/24 0958   07/07/24 1000  linezolid  (ZYVOX ) IVPB 600 mg  Status:  Discontinued        600 mg 300 mL/hr over 60 Minutes Intravenous Every 12 hours 07/07/24 0957 07/08/24 0958   07/06/24 1300  vancomycin  (VANCOREADY) IVPB 1500 mg/300 mL        1,500 mg 150 mL/hr over 120 Minutes Intravenous  Once 07/06/24 1254 07/06/24 1642   07/06/24 1254  vancomycin  variable dose per unstable renal function  (pharmacist dosing)  Status:  Discontinued         Does not apply See admin instructions 07/06/24 1254 07/07/24 0928   07/06/24 1245  ceFEPIme  (MAXIPIME ) 2 g in sodium chloride  0.9 % 100 mL IVPB  Status:  Discontinued        2 g 200 mL/hr over 30 Minutes Intravenous  Once 07/06/24 1231 07/06/24 1254   07/06/24 1245  metroNIDAZOLE  (FLAGYL ) IVPB 500 mg  Status:  Discontinued        500 mg 100 mL/hr over 60 Minutes Intravenous Every 12 hours 07/06/24 1231 07/08/24 0958   07/06/24 1245  vancomycin  (VANCOCIN ) IVPB 1000 mg/200 mL premix  Status:  Discontinued        1,000 mg 200 mL/hr over 60 Minutes Intravenous  Once 07/06/24 1231 07/06/24 1254        MEDICATIONS: Scheduled Meds:  vitamin B-12  1,000 mcg Oral Daily   feeding supplement  237 mL Oral BID BM   folic acid   1 mg Oral Daily   heparin  injection (subcutaneous)  5,000 Units Subcutaneous Q8H   LORazepam   2 mg Intravenous Once   magic mouthwash  10 mL Oral  QID   metoprolol  tartrate  37.5 mg Oral BID   midodrine   10 mg Oral Once   midodrine   5 mg Oral TID WC   multivitamin with minerals  1 tablet Oral Daily   pantoprazole   40 mg Oral BID   phosphorus  500 mg Oral BID   QUEtiapine   25 mg Oral BID   sucralfate   1 g Oral TID WC & HS   [START ON 07/16/2024] thiamine   100 mg Oral Daily   Continuous Infusions:  sodium chloride  75 mL/hr at 07/15/24 1507   PRN Meds:.acetaminophen  **OR** acetaminophen , haloperidol  lactate, ipratropium-albuterol , metoprolol  tartrate, polyethylene glycol   I have personally reviewed following labs and imaging studies  LABORATORY DATA: CBC: Recent Labs  Lab 07/09/24 0334 07/10/24 0316 07/11/24 0527 07/12/24 0522 07/13/24 0618 07/14/24 0217 07/15/24 0500  WBC 3.9*   < > 4.4 3.5* 4.5 5.5 8.7  NEUTROABS 2.9  --   --   --   --   --   --   HGB 9.9*   < > 9.4* 9.0* 8.8* 9.2* 9.9*  HCT 27.2*   < > 25.8* 25.3* 25.2* 25.7* 28.2*  MCV 106.7*   < > 106.2* 106.3* 108.6* 107.5* 107.6*  PLT 59*   < >  76* 85* 118* 156 253   < > = values in this interval not displayed.    Basic Metabolic Panel: Recent Labs  Lab 07/11/24 0527 07/12/24 0522 07/13/24 0618 07/14/24 0217 07/15/24 0500  NA 139 138 136 137 138  K 3.3* 3.3* 4.0 3.7 3.3*  CL 106 105 107 108 106  CO2 23 23 21* 20* 19*  GLUCOSE 94 86 95 99 93  BUN 14 10 11 14 12   CREATININE 0.89 0.76 0.86 0.90 0.89  CALCIUM 7.8* 7.6* 7.4* 7.8* 8.2*  MG 1.9 1.5* 2.0 2.0 1.8  PHOS 1.8* 2.8 2.5 2.7 2.6    GFR: Estimated Creatinine Clearance: 65.8 mL/min (by C-G formula based on SCr of 0.89 mg/dL).  Liver Function Tests: Recent Labs  Lab 07/09/24 0334  AST 64*  ALT 45*  ALKPHOS 54  BILITOT 1.3*  PROT 4.5*  ALBUMIN 2.7*   No results for input(s): LIPASE, AMYLASE in the last 168 hours. No results for input(s): AMMONIA in the last 168 hours.  Coagulation Profile: No results for input(s): INR, PROTIME in the last 168 hours.  Cardiac Enzymes: Recent Labs  Lab 07/09/24 0334  CKTOTAL 162    BNP (last 3 results) No results for input(s): PROBNP in the last 8760 hours.  Lipid Profile: No results for input(s): CHOL, HDL, LDLCALC, TRIG, CHOLHDL, LDLDIRECT in the last 72 hours.  Thyroid Function Tests: No results for input(s): TSH, T4TOTAL, FREET4, T3FREE, THYROIDAB in the last 72 hours.  Anemia Panel: No results for input(s): VITAMINB12, FOLATE, FERRITIN, TIBC, IRON, RETICCTPCT in the last 72 hours.  Urine analysis:    Component Value Date/Time   COLORURINE AMBER (A) 07/07/2024 1524   APPEARANCEUR CLOUDY (A) 07/07/2024 1524   LABSPEC 1.020 07/07/2024 1524   PHURINE 5.0 07/07/2024 1524   GLUCOSEU 50 (A) 07/07/2024 1524   HGBUR MODERATE (A) 07/07/2024 1524   BILIRUBINUR NEGATIVE 07/07/2024 1524   KETONESUR 20 (A) 07/07/2024 1524   PROTEINUR 30 (A) 07/07/2024 1524   UROBILINOGEN 0.2 04/27/2009 1425   NITRITE NEGATIVE 07/07/2024 1524   LEUKOCYTESUR NEGATIVE 07/07/2024 1524     Sepsis Labs: Lactic Acid, Venous    Component Value Date/Time   LATICACIDVEN 2.0 (HH) 07/07/2024 0404  MICROBIOLOGY: Recent Results (from the past 240 hours)  Resp panel by RT-PCR (RSV, Flu A&B, Covid) Anterior Nasal Swab     Status: None   Collection Time: 07/06/24 11:46 AM   Specimen: Anterior Nasal Swab  Result Value Ref Range Status   SARS Coronavirus 2 by RT PCR NEGATIVE NEGATIVE Final   Influenza A by PCR NEGATIVE NEGATIVE Final   Influenza B by PCR NEGATIVE NEGATIVE Final    Comment: (NOTE) The Xpert Xpress SARS-CoV-2/FLU/RSV plus assay is intended as an aid in the diagnosis of influenza from Nasopharyngeal swab specimens and should not be used as a sole basis for treatment. Nasal washings and aspirates are unacceptable for Xpert Xpress SARS-CoV-2/FLU/RSV testing.  Fact Sheet for Patients: bloggercourse.com  Fact Sheet for Healthcare Providers: seriousbroker.it  This test is not yet approved or cleared by the [place]  FDA and has been authorized for detection and/or diagnosis of SARS-CoV-2 by FDA under an Emergency Use Authorization (EUA). This EUA will remain in effect (meaning this test can be used) for the duration of the COVID-19 declaration under Section 564(b)(1) of the Act, 21 U.S.C. section 360bbb-3(b)(1), unless the authorization is terminated or revoked.     Resp Syncytial Virus by PCR NEGATIVE NEGATIVE Final    Comment: (NOTE) Fact Sheet for Patients: bloggercourse.com  Fact Sheet for Healthcare Providers: seriousbroker.it  This test is not yet approved or cleared by the [place]  FDA and has been authorized for detection and/or diagnosis of SARS-CoV-2 by FDA under an Emergency Use Authorization (EUA). This EUA will remain in effect (meaning this test can be used) for the duration of the COVID-19 declaration under Section 564(b)(1) of  the Act, 21 U.S.C. section 360bbb-3(b)(1), unless the authorization is terminated or revoked.  Performed at [facility], [address], [place], [place] [address]   Blood Culture (routine x 2)     Status: None   Collection Time: 07/06/24 11:51 AM   Specimen: BLOOD RIGHT ARM  Result Value Ref Range Status   Specimen Description BLOOD RIGHT ARM  Final   Special Requests   Final    BOTTLES DRAWN AEROBIC AND ANAEROBIC Blood Culture adequate volume   Culture   Final    NO GROWTH 5 DAYS Performed at [facility], [address], [place], [place] [address]    Report Status 07/11/2024 FINAL  Final  Blood Culture (routine x 2)     Status: None   Collection Time: 07/06/24 12:18 PM   Specimen: BLOOD  Result Value Ref Range Status   Specimen Description BLOOD  Final   Special Requests NONE  Final   Culture   Final    NO GROWTH 5 DAYS Performed at [facility], [address] [place] [place], [place] [address]    Report Status 07/11/2024 FINAL  Final  MRSA Next Gen by PCR, Nasal     Status: None   Collection Time: 07/07/24  5:27 PM   Specimen: Nasal Mucosa; Nasal Swab  Result Value Ref Range Status   MRSA by PCR Next Gen NOT DETECTED NOT DETECTED Final    Comment: (NOTE) The GeneXpert MRSA Assay (FDA approved for NASAL specimens only), is one component of a comprehensive MRSA colonization surveillance program. It is not intended to diagnose MRSA infection nor to guide or monitor treatment for MRSA infections. Test performance is not FDA approved in patients less than 38 years old. Performed at [facility], [address], [place], [place]  [address]   Respiratory (~20 pathogens) panel by PCR     Status: None   Collection Time: 07/08/24 10:13 AM   Specimen: Nasopharyngeal Swab; Respiratory  Result Value Ref Range Status   Adenovirus NOT DETECTED NOT DETECTED Final   Coronavirus 229E NOT DETECTED NOT DETECTED Final    Comment: (NOTE) The Coronavirus on the  Respiratory Panel, DOES NOT test for the novel  Coronavirus (2019 nCoV)    Coronavirus HKU1 NOT DETECTED NOT DETECTED Final   Coronavirus NL63 NOT DETECTED NOT DETECTED Final   Coronavirus OC43 NOT DETECTED NOT DETECTED Final   Metapneumovirus NOT DETECTED NOT DETECTED Final   Rhinovirus / Enterovirus NOT DETECTED NOT DETECTED Final   Influenza A NOT DETECTED NOT DETECTED Final   Influenza B NOT DETECTED NOT DETECTED Final   Parainfluenza Virus 1 NOT DETECTED NOT DETECTED Final   Parainfluenza Virus 2 NOT DETECTED NOT DETECTED Final   Parainfluenza Virus 3 NOT DETECTED NOT DETECTED Final   Parainfluenza Virus 4 NOT DETECTED NOT DETECTED Final   Respiratory Syncytial Virus NOT DETECTED NOT DETECTED Final   Bordetella pertussis NOT DETECTED NOT DETECTED Final   Bordetella Parapertussis NOT DETECTED NOT DETECTED Final   Chlamydophila pneumoniae NOT DETECTED NOT DETECTED Final   Mycoplasma pneumoniae NOT DETECTED NOT DETECTED Final    Comment: Performed at [facility], [address] [place], [place] [address]    RADIOLOGY STUDIES/RESULTS: No results found.   LOS: 9 days   Donalda Applebaum, MD  [facility]    To contact the attending provider between 7A-7P or the covering provider during after hours 7P-7A, please log into the web site [email or web address] and access using universal [place] password for that web site. If you do not have the password, please call the hospital operator.  07/15/2024, 7:59 PM    "

## 2024-07-15 NOTE — Progress Notes (Signed)
 " PROGRESS NOTE    Gabriel Everett  FMW:[id number] DOB: [birth year] DOA: 07/06/2024 PCP: [name], FNP   Brief Narrative:  79 year old with past medical history significant for hypertension, A-fib, alcohol  use presented with altered mental status and weakness.  Patient was found down on the floor by family.  EMS was contacted.  Patient was noted to be confused.  Patient was recently treated for the flu a week ago.  Patient was noted to have multiple skin tears.  On EMS evaluation initial blood pressure was in the 70s.  Improved with IV fluids.  Patient and route had seizure lasting 1 minute with tonic-clonic activity.  Evaluation in the ED presented with a creatinine of 2.7 baseline of 1, bilirubin 3.3, white blood cell 11, hemoglobin 11, platelets 108, INR 1.5, lactic acid 7 subsequently repeated 5.  Respiratory panel, flu COVID RSV negative.  Chest x-ray showed no acute abnormality.  CT head showed no acute abnormality.  CT of chest abdomen and pelvis showed no acute abnormality but did show some hepatic steatosis   Assessment & Plan:     Acute metabolic encephalopathy Severe dehydration Alcohol  withdrawal with DTs -Was found down by family on the floor, has been laying down for few hours, workup on admission significant for severe dehydration and significant electrolyte derangements.  And mild rhabdomyolysis. -questionable history of alcohol  withdrawals versus seizure -  MRI brain and EEG nonacute, he denies any headache, he appears severely dehydrated with severe electrolyte abnormalities - PT/OT input appreciated, will need SNF placement  - Was in acute withdrawals, this has much improved. - Patient became significantly confused overnight, at this point this is most likely in the setting of hospital delirium, started on Seroquel  twice daily.    Hypertension Orthostasis - Pressure initially soft, started to increase, resume home regimen on propranolol . - Patient with PT today  was noted to be significantly orthostatic, quite symptomatic with dizziness, he was reevaluated, sitting up blood pressure acceptable 136/75, but upon standing up came down to 70/51. - Continue midodrine  5 mg 3 times daily, uptitrate if needed . - Continue with IV fluids - will change propranolol  to metoprolol  (still needing an agent to control heart rate in the setting of A-fib) . - Heart rate remains elevated, will increase metoprolol  to 37.5 mg twice daily  alcohol  abuse Alcohol   withdrawal -Drinks on average 4 shots per day pressure grandson at bedside - On CIWA protocol for DTs, as well on scheduled low dose Librium , did not require any for last couple days,. - He was treated with high-dose thiamine  during hospital stay, will discharge on thiamine , folic acid  and multivitamins - Borderline B12, continue with B12 and folic acid  on discharge.       AKI Mild rhabdo Metabolic acidosis with elevated anion gap -AKI on presentation, with elevated total CK, with evidence of urine dipstick for blood while no RBCs seen in hpf.  Total CK has normalized. - Resolved with IV fluids     Hypokalemia Hypomagnesemia Hypophosphatemia - This was replaced, normalized on discharge   SIRS Hypothermia Possible UTI Due to being exposed to elements and down for several hours, supportive care, TSH stable, B12 borderline low which will be replaced, no signs of infectious focus sipped possible mild UTI.  Finished 5 days of Rocephin  for UTI   Coffee Ground emesis;  Severe esophagitis Odynophagia Dysphagia - Questionable dark emesis was noted by family, during hospital stay there was no  further evidence of coffee-ground emesis or melena  or significant GI bleed. - Patient reports some difficulty/pain swallowing, likely underlying fasciitis with his history of heavy alcohol  abuse, no acute finding on CT chest/abdomen/pelvis - Hemoglobin has trended down slowly, but this is most likely delusional  effect,. - Went for MBS today, no difficulty swallowing his barium tablets or acute abnormalities found on the study. - Started on IV Protonix  40 mg p.o. twice daily, and Carafate , symptoms much improved, will continue as an outpatient, recommended for the patient to follow-up with GI as an outpatient once stable  A-fib - Back on beta-blockers, propranolol  changed to metoprolol  for better heart rate control, dose was increased this morning -Was not on anticoagulation prior to admission, and I will refrain from starting in the setting of his falls, thrombocytopenia.   Thrombocytopenia - could be related to infectious process versus alcohol .  Improving   Mild asymptomatic transaminitis. - Negative hepatitis panel - Ultrasound significant for hepatic steatosis - This is secondary to alcohol  abu     Estimated body mass index is 22.24 kg/m as calculated from the following:   Height as of this encounter: 5' 10 (1.778 m).   Weight as of this encounter: 70.3 kg.   DVT prophylaxis: SCD, Honor heparin  Code Status: Full code   Family Communication: Discussed with grandson at bedside 1/26, none at bedside today Disposition Plan: To SNF, likely early next week once over his withdrawals Status is: Inpatient Remains inpatient appropriate because: Management of acute encephalopathy AKI    Consultants:  None  Procedures:  none  Antimicrobials:    Subjective:  Was confused and restless overnight, but improved this morning  Objective: Vitals:   07/14/24 2152 07/15/24 0020 07/15/24 0800 07/15/24 1205  BP: (!) 135/91 (!) 171/84 (!) 143/66 (!) 89/46  Pulse: 89 79 85 (!) 59  Resp:   17 17  Temp:  98.4 F (36.9 C) (!) 97.4 F (36.3 C) 97.6 F (36.4 C)  TempSrc:  Oral Oral Axillary  SpO2:      Weight:      Height:       No intake or output data in the 24 hours ending 07/15/24 1417  Filed Weights   07/06/24 1136  Weight: 70.3 kg    Examination:      General: Pt is alert,  awake, mildly confused, deconditioned, no apparent distress Cardiovascular: irregular, S1/S2 +, no rubs, no gallops Respiratory: CTA bilaterally, no wheezing, no rhonchi Abdominal: Soft, NT, ND, bowel sounds + Extremities: no edema, no cyanosis     Data Review:   Patient Lines/Drains/Airways Status     Active Line/Drains/Airways     Name Placement date Placement time Site Days   Peripheral IV 07/14/24 22 G 1 Right;Lateral Forearm 07/14/24  1956  Forearm  1   External Urinary Catheter 07/07/24  1307  --  8   Wound 07/07/24 1246 Other (Comment) Arm Lower;Posterior;Right 07/07/24  1246  Arm  8   Wound 07/07/24 1247 Hand Left;Posterior 07/07/24  1247  Hand  8   Wound 07/07/24 1250 Pressure Injury Buttocks Deep Tissue Pressure Injury - Purple or maroon localized area of discolored intact skin or blood-filled blister due to damage of underlying soft tissue from pressure and/or shear. 07/07/24  1250  Buttocks  8   Wound 07/07/24 1300 Traumatic Arm Left;Posterior;Upper 07/07/24  1300  Arm  8   Wound 07/07/24 1300 Traumatic Arm Left;Lower;Posterior;Proximal 07/07/24  1300  Arm  8             Inpatient  Medications  Scheduled Meds:  vitamin B-12  1,000 mcg Oral Daily   feeding supplement  237 mL Oral BID BM   folic acid   1 mg Oral Daily   heparin  injection (subcutaneous)  5,000 Units Subcutaneous Q8H   LORazepam   2 mg Intravenous Once   magic mouthwash  10 mL Oral QID   metoprolol  tartrate  37.5 mg Oral BID   midodrine   10 mg Oral Once   midodrine   5 mg Oral TID WC   multivitamin with minerals  1 tablet Oral Daily   pantoprazole   40 mg Oral BID   phosphorus  500 mg Oral BID   QUEtiapine   25 mg Oral BID   sucralfate   1 g Oral TID WC & HS   [START ON 07/16/2024] thiamine   100 mg Oral Daily   Continuous Infusions:  lactated ringers  75 mL/hr at 07/15/24 0935   PRN Meds:.acetaminophen  **OR** acetaminophen , haloperidol  lactate, ipratropium-albuterol , metoprolol  tartrate,  polyethylene glycol  DVT Prophylaxis   Recent Labs  Lab 07/09/24 0334 07/10/24 0316 07/11/24 0527 07/12/24 0522 07/13/24 0618 07/14/24 0217 07/15/24 0500  WBC 3.9*   < > 4.4 3.5* 4.5 5.5 8.7  HGB 9.9*   < > 9.4* 9.0* 8.8* 9.2* 9.9*  HCT 27.2*   < > 25.8* 25.3* 25.2* 25.7* 28.2*  PLT 59*   < > 76* 85* 118* 156 253  MCV 106.7*   < > 106.2* 106.3* 108.6* 107.5* 107.6*  MCH 38.8*   < > 38.7* 37.8* 37.9* 38.5* 37.8*  MCHC 36.4*   < > 36.4* 35.6 34.9 35.8 35.1  RDW 13.3   < > 13.2 13.4 13.9 13.7 13.1  LYMPHSABS 0.7  --   --   --   --   --   --   MONOABS 0.3  --   --   --   --   --   --   EOSABS 0.0  --   --   --   --   --   --   BASOSABS 0.0  --   --   --   --   --   --    < > = values in this interval not displayed.    Recent Labs  Lab 07/09/24 0334 07/10/24 0316 07/11/24 0527 07/12/24 0522 07/13/24 0618 07/14/24 0217 07/15/24 0500  NA 136   < > 139 138 136 137 138  K 4.0   < > 3.3* 3.3* 4.0 3.7 3.3*  CL 102   < > 106 105 107 108 106  CO2 28   < > 23 23 21* 20* 19*  ANIONGAP 7   < > 10 10 8 8 13   GLUCOSE 127*   < > 94 86 95 99 93  BUN 17   < > 14 10 11 14 12   CREATININE 0.96   < > 0.89 0.76 0.86 0.90 0.89  AST 64*  --   --   --   --   --   --   ALT 45*  --   --   --   --   --   --   ALKPHOS 54  --   --   --   --   --   --   BILITOT 1.3*  --   --   --   --   --   --   ALBUMIN 2.7*  --   --   --   --   --   --  MG 1.7   < > 1.9 1.5* 2.0 2.0 1.8  PHOS <1.0*   < > 1.8* 2.8 2.5 2.7 2.6  CALCIUM 7.4*   < > 7.8* 7.6* 7.4* 7.8* 8.2*   < > = values in this interval not displayed.      Recent Labs  Lab 07/11/24 0527 07/12/24 0522 07/13/24 0618 07/14/24 0217 07/15/24 0500  MG 1.9 1.5* 2.0 2.0 1.8  CALCIUM 7.8* 7.6* 7.4* 7.8* 8.2*    --------------------------------------------------------------------------------------------------------------- Lab Results  Component Value Date   CHOL 153 11/24/2023   HDL 52 11/24/2023   LDLCALC 78 11/24/2023   TRIG 114  11/24/2023   CHOLHDL 2.9 11/24/2023    Lab Results  Component Value Date   HGBA1C 4.6 (L) 11/23/2023   No results for input(s): TSH, T4TOTAL, FREET4, T3FREE, THYROIDAB in the last 72 hours.  No results for input(s): VITAMINB12, FOLATE, FERRITIN, TIBC, IRON, RETICCTPCT in the last 72 hours.  ------------------------------------------------------------------------------------------------------------------ Cardiac Enzymes No results for input(s): CKMB, TROPONINI, MYOGLOBIN in the last 168 hours.  Invalid input(s): CK  Micro Results Recent Results (from the past 240 hours)  Resp panel by RT-PCR (RSV, Flu A&B, Covid) Anterior Nasal Swab     Status: None   Collection Time: 07/06/24 11:46 AM   Specimen: Anterior Nasal Swab  Result Value Ref Range Status   SARS Coronavirus 2 by RT PCR NEGATIVE NEGATIVE Final   Influenza A by PCR NEGATIVE NEGATIVE Final   Influenza B by PCR NEGATIVE NEGATIVE Final    Comment: (NOTE) The Xpert Xpress SARS-CoV-2/FLU/RSV plus assay is intended as an aid in the diagnosis of influenza from Nasopharyngeal swab specimens and should not be used as a sole basis for treatment. Nasal washings and aspirates are unacceptable for Xpert Xpress SARS-CoV-2/FLU/RSV testing.  Fact Sheet for Patients: bloggercourse.com  Fact Sheet for Healthcare Providers: seriousbroker.it  This test is not yet approved or cleared by the [place]  FDA and has been authorized for detection and/or diagnosis of SARS-CoV-2 by FDA under an Emergency Use Authorization (EUA). This EUA will remain in effect (meaning this test can be used) for the duration of the COVID-19 declaration under Section 564(b)(1) of the Act, 21 U.S.C. section 360bbb-3(b)(1), unless the authorization is terminated or revoked.     Resp Syncytial Virus by PCR NEGATIVE NEGATIVE Final    Comment: (NOTE) Fact Sheet for  Patients: bloggercourse.com  Fact Sheet for Healthcare Providers: seriousbroker.it  This test is not yet approved or cleared by the [place]  FDA and has been authorized for detection and/or diagnosis of SARS-CoV-2 by FDA under an Emergency Use Authorization (EUA). This EUA will remain in effect (meaning this test can be used) for the duration of the COVID-19 declaration under Section 564(b)(1) of the Act, 21 U.S.C. section 360bbb-3(b)(1), unless the authorization is terminated or revoked.  Performed at [facility], [address], [place], [place] [address]   Blood Culture (routine x 2)     Status: None   Collection Time: 07/06/24 11:51 AM   Specimen: BLOOD RIGHT ARM  Result Value Ref Range Status   Specimen Description BLOOD RIGHT ARM  Final   Special Requests   Final    BOTTLES DRAWN AEROBIC AND ANAEROBIC Blood Culture adequate volume   Culture   Final    NO GROWTH 5 DAYS Performed at [facility], [address], [place], [place] [address]    Report Status 07/11/2024 FINAL  Final  Blood Culture (routine x 2)  Status: None   Collection Time: 07/06/24 12:18 PM   Specimen: BLOOD  Result Value Ref Range Status   Specimen Description BLOOD  Final   Special Requests NONE  Final   Culture   Final    NO GROWTH 5 DAYS Performed at [facility], [address] [place], [place] [address]    Report Status 07/11/2024 FINAL  Final  MRSA Next Gen by PCR, Nasal     Status: None   Collection Time: 07/07/24  5:27 PM   Specimen: Nasal Mucosa; Nasal Swab  Result Value Ref Range Status   MRSA by PCR Next Gen NOT DETECTED NOT DETECTED Final    Comment: (NOTE) The GeneXpert MRSA Assay (FDA approved for NASAL specimens only), is one component of a comprehensive MRSA colonization surveillance program. It is not intended to diagnose MRSA infection nor to guide or monitor treatment for MRSA infections. Test  performance is not FDA approved in patients less than 45 years old. Performed at [facility], [address], [place], [place] [address]   Respiratory (~20 pathogens) panel by PCR     Status: None   Collection Time: 07/08/24 10:13 AM   Specimen: Nasopharyngeal Swab; Respiratory  Result Value Ref Range Status   Adenovirus NOT DETECTED NOT DETECTED Final   Coronavirus 229E NOT DETECTED NOT DETECTED Final    Comment: (NOTE) The Coronavirus on the Respiratory Panel, DOES NOT test for the novel  Coronavirus (2019 nCoV)    Coronavirus HKU1 NOT DETECTED NOT DETECTED Final   Coronavirus NL63 NOT DETECTED NOT DETECTED Final   Coronavirus OC43 NOT DETECTED NOT DETECTED Final   Metapneumovirus NOT DETECTED NOT DETECTED Final   Rhinovirus / Enterovirus NOT DETECTED NOT DETECTED Final   Influenza A NOT DETECTED NOT DETECTED Final   Influenza B NOT DETECTED NOT DETECTED Final   Parainfluenza Virus 1 NOT DETECTED NOT DETECTED Final   Parainfluenza Virus 2 NOT DETECTED NOT DETECTED Final   Parainfluenza Virus 3 NOT DETECTED NOT DETECTED Final   Parainfluenza Virus 4 NOT DETECTED NOT DETECTED Final   Respiratory Syncytial Virus NOT DETECTED NOT DETECTED Final   Bordetella pertussis NOT DETECTED NOT DETECTED Final   Bordetella Parapertussis NOT DETECTED NOT DETECTED Final   Chlamydophila pneumoniae NOT DETECTED NOT DETECTED Final   Mycoplasma pneumoniae NOT DETECTED NOT DETECTED Final    Comment: Performed at [facility], [address], [place], [place] [address]    Radiology Reports  No results found.     Signature  -   Brayton Lye M.D on 07/15/2024 at 2:17 PM   -  To page go to [email or web address]       07/15/2024, 2:17 PM   "

## 2024-07-15 NOTE — Progress Notes (Signed)
 Psychiatric Nurse Liaison (PNL) Rounding Note  Current SI/HI/AVH: denies  Patient Mood/Affect: confused; disoriented; irritable  Noted Patient Behaviors: pt has removed his gown and trying to get out of bed, stating he has somewhere to go; pt hard to redirect; soft mitts to prevent pulling out IV lines  Interventions Initiated by Psychiatric Nurse Liaison: staff asked if this writer would take a look at pt and his behaviors; per RN, pt was alert and oriented and supposed to discharge today, but pt became orthostatic; this evening, pt is confused; disoriented; believes we are in his house; giving this writer directions to his kitchen; then pt states he has to get to work. Assisted RN to get pt back in bed and to take his nighttime medications. Checked soft mitts and applied appropriately. Asked pt about his job and family. Pt beginning to calm down and respond to medication. Bed alarm activated by pt's RN.  Recommendations for Patient Care: no new recommendations  Patients Response to Treatment: pt calmer and more cooperative  Time Spent with Patient:   20 minutes   Loetta Pinal RN, BSN, RN-BC

## 2024-07-15 NOTE — Plan of Care (Signed)
" °  Problem: Fluid Volume: Goal: Hemodynamic stability will improve Outcome: Progressing   Problem: Clinical Measurements: Goal: Diagnostic test results will improve Outcome: Progressing Goal: Signs and symptoms of infection will decrease Outcome: Progressing   Problem: Respiratory: Goal: Ability to maintain adequate ventilation will improve Outcome: Progressing   Problem: Safety: Goal: Non-violent Restraint(s) Outcome: Progressing   Problem: Education: Goal: Expressions of having a comfortable level of knowledge regarding the disease process will increase Outcome: Progressing   Problem: Coping: Goal: Ability to adjust to condition or change in health will improve Outcome: Progressing Goal: Ability to identify appropriate support needs will improve Outcome: Progressing   Problem: Health Behavior/Discharge Planning: Goal: Compliance with prescribed medication regimen will improve Outcome: Progressing   Problem: Medication: Goal: Risk for medication side effects will decrease Outcome: Progressing   Problem: Clinical Measurements: Goal: Complications related to the disease process, condition or treatment will be avoided or minimized Outcome: Progressing Goal: Diagnostic test results will improve Outcome: Progressing   Problem: Safety: Goal: Verbalization of understanding the information provided will improve Outcome: Progressing   Problem: Self-Concept: Goal: Level of anxiety will decrease Outcome: Progressing Goal: Ability to verbalize feelings about condition will improve Outcome: Progressing   Problem: Education: Goal: Knowledge of General Education information will improve Description: Including pain rating scale, medication(s)/side effects and non-pharmacologic comfort measures Outcome: Progressing   Problem: Health Behavior/Discharge Planning: Goal: Ability to manage health-related needs will improve Outcome: Progressing   Problem: Clinical  Measurements: Goal: Ability to maintain clinical measurements within normal limits will improve Outcome: Progressing Goal: Will remain free from infection Outcome: Progressing Goal: Diagnostic test results will improve Outcome: Progressing Goal: Respiratory complications will improve Outcome: Progressing Goal: Cardiovascular complication will be avoided Outcome: Progressing   Problem: Activity: Goal: Risk for activity intolerance will decrease Outcome: Progressing   Problem: Nutrition: Goal: Adequate nutrition will be maintained Outcome: Progressing   Problem: Coping: Goal: Level of anxiety will decrease Outcome: Progressing   Problem: Elimination: Goal: Will not experience complications related to bowel motility Outcome: Progressing Goal: Will not experience complications related to urinary retention Outcome: Progressing   Problem: Pain Managment: Goal: General experience of comfort will improve and/or be controlled Outcome: Progressing   Problem: Safety: Goal: Ability to remain free from injury will improve Outcome: Progressing   Problem: Skin Integrity: Goal: Risk for impaired skin integrity will decrease Outcome: Progressing   "

## 2024-07-15 NOTE — Progress Notes (Signed)
 Occupational Therapy Treatment Patient Details Name: Gabriel Everett MRN: [id number] DOB: 01-20-1946 Today's Date: 07/15/2024   History of present illness 79 y.o. male presents 07/06/24 with AMS and weakness. Possible seizure activity en route to ED.  Found to have hypotension, AKI, elevated lactic acid, mild leukocytosis. No source for infection, concern for SIRS/sepsis versus seizure. EEG study results within normal limits.   PMH: HTN, afib, and alcohol  use.   OT comments  Pt demonstrating slow progress towards goals. Focus of session on activity tolerance and increasing engagement in following simple one step commands to facilitate increased engagement in ADLs and functional mobility. Pt continues to require increased assistance for bed mobility and ADL tasks. OT to continue to follow Pt acutely, continue per POC.       If plan is discharge home, recommend the following:  Two people to help with walking and/or transfers;Two people to help with bathing/dressing/bathroom;Assistance with cooking/housework;Assistance with feeding;Direct supervision/assist for medications management;Direct supervision/assist for financial management;Assist for transportation;Help with stairs or ramp for entrance   Equipment Recommendations  Other (comment) (defer)    Recommendations for Other Services      Precautions / Restrictions Precautions Precautions: Fall Recall of Precautions/Restrictions: Impaired Precaution/Restrictions Comments: Seizure Restrictions Weight Bearing Restrictions Per Provider Order: No       Mobility Bed Mobility Overal bed mobility: Needs Assistance Bed Mobility: Rolling Rolling: Min assist         General bed mobility comments: Min A to roll to L side, Pt unable to maintain sidelying position and rolled back to supine. Pt declining OOB mobility    Transfers                   General transfer comment: Pt declined OOB mobility     Balance                                            ADL either performed or assessed with clinical judgement   ADL Overall ADL's : Needs assistance/impaired     Grooming: Total assistance Grooming Details (indicate cue type and reason): Total HOHA for washing face task bed level , Pt increasingly agitated with task. Offered option to complete oral care and Pt initially agreeing but then demonstrated confusion with command to hold tooth brush                                    Extremity/Trunk Assessment Upper Extremity Assessment Upper Extremity Assessment: Generalized weakness            Vision       Perception     Praxis     Communication Communication Communication: Impaired Factors Affecting Communication: Reduced clarity of speech   Cognition Arousal: Lethargic Behavior During Therapy: Flat affect, Agitated Cognition: Difficult to assess, Cognition impaired   Orientation impairments: Situation, Place, Time Awareness: Intellectual awareness impaired, Online awareness impaired   Attention impairment (select first level of impairment): Sustained attention Executive functioning impairment (select all impairments): Initiation, Organization, Sequencing, Reasoning, Problem solving OT - Cognition Comments: Pt only oriented to self this session. Increased agitation with following commands                 Following commands: Impaired Following commands impaired: Follows one step commands inconsistently      Cueing  Cueing Techniques: Verbal cues, Tactile cues, Visual cues  Exercises      Shoulder Instructions       General Comments Focus of session on facilitating Pt ability to follow simple one step commands and actively participate in ADLs. BP 112/97 (102).    Pertinent Vitals/ Pain       Pain Assessment Pain Assessment: Faces Faces Pain Scale: Hurts even more Pain Location: buttock Pain Descriptors / Indicators: Grimacing Pain  Intervention(s): Limited activity within patient's tolerance, Monitored during session, Repositioned  Home Living                                          Prior Functioning/Environment              Frequency  Min 2X/week        Progress Toward Goals  OT Goals(current goals can now be found in the care plan section)  Progress towards OT goals: Progressing toward goals  Acute Rehab OT Goals Patient Stated Goal: to sleep OT Goal Formulation: Patient unable to participate in goal setting Time For Goal Achievement: 07/22/24 Potential to Achieve Goals: Good ADL Goals Pt Will Perform Upper Body Dressing: with min assist;sitting Pt Will Perform Lower Body Dressing: with min assist;sitting/lateral leans Pt Will Transfer to Toilet: with min assist;stand pivot transfer;bedside commode Additional ADL Goal #1: Pt will engage in bed mobility with Min A as a precursor to engagement in ADL tasks OOB Additional ADL Goal #2: Pt will tolerate 5 minute ADL task sitting EOB with no more than CGA.  Plan      Co-evaluation                 AM-PAC OT 6 Clicks Daily Activity     Outcome Measure   Help from another person eating meals?: Total Help from another person taking care of personal grooming?: A Lot Help from another person toileting, which includes using toliet, bedpan, or urinal?: Total Help from another person bathing (including washing, rinsing, drying)?: A Lot Help from another person to put on and taking off regular upper body clothing?: A Lot Help from another person to put on and taking off regular lower body clothing?: A Lot 6 Click Score: 10    End of Session    OT Visit Diagnosis: Unsteadiness on feet (R26.81);Muscle weakness (generalized) (M62.81);Pain;History of falling (Z91.81)   Activity Tolerance Patient limited by lethargy   Patient Left in bed;with call bell/phone within reach;with bed alarm set (mitts reapplied)   Nurse  Communication          Time: 8743-8693 OT Time Calculation (min): 10 min  Charges: OT General Charges $OT Visit: 1 Visit OT Treatments $Self Care/Home Management : 8-22 mins  Maurilio CROME, OTR/L.  [facility]  Office: [phone]   Maurilio PARAS Naji Mehringer 07/15/2024, 2:41 PM

## 2024-07-16 LAB — CBC
HCT: 26.3 % — ABNORMAL LOW (ref 39.0–52.0)
Hemoglobin: 8.9 g/dL — ABNORMAL LOW (ref 13.0–17.0)
MCH: 37.6 pg — ABNORMAL HIGH (ref 26.0–34.0)
MCHC: 33.8 g/dL (ref 30.0–36.0)
MCV: 111 fL — ABNORMAL HIGH (ref 80.0–100.0)
Platelets: 251 10*3/uL (ref 150–400)
RBC: 2.37 MIL/uL — ABNORMAL LOW (ref 4.22–5.81)
RDW: 13.7 % (ref 11.5–15.5)
WBC: 7.1 10*3/uL (ref 4.0–10.5)
nRBC: 0 % (ref 0.0–0.2)

## 2024-07-16 LAB — BASIC METABOLIC PANEL WITH GFR
Anion gap: 13 (ref 5–15)
BUN: 13 mg/dL (ref 8–23)
CO2: 16 mmol/L — ABNORMAL LOW (ref 22–32)
Calcium: 7.8 mg/dL — ABNORMAL LOW (ref 8.9–10.3)
Chloride: 111 mmol/L (ref 98–111)
Creatinine, Ser: 0.85 mg/dL (ref 0.61–1.24)
GFR, Estimated: >60 mL/min
Glucose, Bld: 71 mg/dL (ref 70–99)
Potassium: 3.9 mmol/L (ref 3.5–5.1)
Sodium: 140 mmol/L (ref 135–145)

## 2024-07-16 LAB — CORTISOL-AM, BLOOD: Cortisol - AM: 9.3 ug/dL (ref 6.7–22.6)

## 2024-07-16 LAB — PHOSPHORUS: Phosphorus: 3.1 mg/dL (ref 2.5–4.6)

## 2024-07-16 LAB — MAGNESIUM: Magnesium: 1.8 mg/dL (ref 1.7–2.4)

## 2024-07-16 MED ORDER — QUETIAPINE FUMARATE 25 MG PO TABS: 25.0000 mg | ORAL_TABLET | Freq: Two times a day (BID) | ORAL | Status: AC

## 2024-07-16 MED ORDER — MAGNESIUM SULFATE 2 GM/50ML IV SOLN
2.0000 g | Freq: Once | INTRAVENOUS | Status: AC
  Administered 2024-07-16: 2 g via INTRAVENOUS
  Filled 2024-07-16: qty 50

## 2024-07-16 MED ORDER — METOPROLOL TARTRATE 37.5 MG PO TABS: 37.5000 mg | ORAL_TABLET | Freq: Two times a day (BID) | ORAL | Status: AC

## 2024-07-16 MED ORDER — MIDODRINE HCL 5 MG PO TABS
7.5000 mg | ORAL_TABLET | Freq: Three times a day (TID) | ORAL | Status: DC
  Administered 2024-07-16: 7.5 mg via ORAL
  Filled 2024-07-16: qty 2

## 2024-07-16 MED ORDER — MIDODRINE HCL 2.5 MG PO TABS: 7.5000 mg | ORAL_TABLET | Freq: Three times a day (TID) | ORAL | Status: AC

## 2024-07-16 NOTE — TOC Transition Note (Signed)
 Transition of Care [facility]) - Discharge Note   Patient Details  Name: Gabriel Everett MRN: [id number] Date of Birth: 1945-09-29  Transition of Care [facility]) CM/SW Contact:  Inocente GORMAN Kindle, LCSW Phone Number: 07/16/2024, 12:28 PM   Clinical Narrative:    Patient will DC to: [facility] Anticipated DC date: 07/16/24 Family notified: Darden Motto Transport by: [place]   Per MD patient ready for DC to [facility]. RN to call report prior to discharge [phone] room 127A). RN, patient, patient's family, and facility notified of DC. Discharge Summary and FL2 sent to facility. DC packet on chart. Ambulance transport requested for patient.   CSW will sign off for now as social work intervention is no longer needed. Please consult us  again if new needs arise.     Final next level of care: Skilled Nursing Facility Barriers to Discharge: Barriers Resolved   Patient Goals and CMS Choice Patient states their goals for this hospitalization and ongoing recovery are:: Rehab CMS Medicare.gov Compare Post Acute Care list provided to:: Patient Represenative (must comment) Choice offered to / list presented to :  [name] [place] ownership interest in [facility].provided to::  [name]    Discharge Placement   Existing PASRR number confirmed : 07/14/24          Patient chooses bed at:  [facility]) Patient to be transferred to facility by: [facility] Name of family member notified: Grandson Patient and family notified of of transfer: 07/14/24  Discharge Plan and Services Additional resources added to the After Visit Summary for   In-house Referral: Clinical Social Work   Post Acute Care Choice: Skilled Nursing Facility                               Social Drivers of Health (SDOH) Interventions SDOH Screenings   Food Insecurity: No Food Insecurity (07/07/2024)  Housing: Low Risk (07/07/2024)  Transportation Needs: No Transportation Needs (07/07/2024)   Utilities: Not At Risk (07/07/2024)  Social Connections: Socially Isolated (07/07/2024)  Tobacco Use: Low Risk (07/09/2024)     Readmission Risk Interventions     No data to display

## 2024-07-16 NOTE — Plan of Care (Signed)
" °  Problem: Fluid Volume: Goal: Hemodynamic stability will improve Outcome: Completed/Met   Problem: Clinical Measurements: Goal: Diagnostic test results will improve Outcome: Completed/Met Goal: Signs and symptoms of infection will decrease Outcome: Completed/Met   Problem: Respiratory: Goal: Ability to maintain adequate ventilation will improve Outcome: Completed/Met   Problem: Safety: Goal: Non-violent Restraint(s) Outcome: Completed/Met   Problem: Education: Goal: Expressions of having a comfortable level of knowledge regarding the disease process will increase Outcome: Completed/Met   Problem: Coping: Goal: Ability to adjust to condition or change in health will improve Outcome: Completed/Met Goal: Ability to identify appropriate support needs will improve Outcome: Completed/Met   Problem: Health Behavior/Discharge Planning: Goal: Compliance with prescribed medication regimen will improve Outcome: Completed/Met   Problem: Medication: Goal: Risk for medication side effects will decrease Outcome: Completed/Met   Problem: Clinical Measurements: Goal: Complications related to the disease process, condition or treatment will be avoided or minimized Outcome: Completed/Met Goal: Diagnostic test results will improve Outcome: Completed/Met   Problem: Safety: Goal: Verbalization of understanding the information provided will improve Outcome: Completed/Met   Problem: Self-Concept: Goal: Level of anxiety will decrease Outcome: Completed/Met Goal: Ability to verbalize feelings about condition will improve Outcome: Completed/Met   Problem: Education: Goal: Knowledge of General Education information will improve Description: Including pain rating scale, medication(s)/side effects and non-pharmacologic comfort measures Outcome: Completed/Met   Problem: Health Behavior/Discharge Planning: Goal: Ability to manage health-related needs will improve Outcome: Completed/Met    Problem: Clinical Measurements: Goal: Ability to maintain clinical measurements within normal limits will improve Outcome: Completed/Met Goal: Will remain free from infection Outcome: Completed/Met Goal: Diagnostic test results will improve Outcome: Completed/Met Goal: Respiratory complications will improve Outcome: Completed/Met Goal: Cardiovascular complication will be avoided Outcome: Completed/Met   Problem: Activity: Goal: Risk for activity intolerance will decrease Outcome: Completed/Met   Problem: Nutrition: Goal: Adequate nutrition will be maintained Outcome: Completed/Met   Problem: Coping: Goal: Level of anxiety will decrease Outcome: Completed/Met   Problem: Elimination: Goal: Will not experience complications related to bowel motility Outcome: Completed/Met Goal: Will not experience complications related to urinary retention Outcome: Completed/Met   Problem: Pain Managment: Goal: General experience of comfort will improve and/or be controlled Outcome: Completed/Met   Problem: Safety: Goal: Ability to remain free from injury will improve Outcome: Completed/Met   Problem: Skin Integrity: Goal: Risk for impaired skin integrity will decrease Outcome: Completed/Met   "

## 2024-07-16 NOTE — TOC Progression Note (Signed)
 Transition of Care [facility]) - Progression Note    Patient Details  Name: Gabriel Everett MRN: [id number] Date of Birth: 10/06/1945  Transition of Care [facility]) CM/SW Contact  Inocente GORMAN Kindle, LCSW Phone Number: 07/16/2024, 11:09 AM  Clinical Narrative:    Per MD, patient stable for discharge. [facility] was able to switch their NPI number with insurance and CSW spoke with insurance CM to confirm. Bed is available today. CSW contacted grandson; no vm available so sent HIPAA compliant text.    Expected Discharge Plan: Skilled Nursing Facility Barriers to Discharge: Barriers Resolved               Expected Discharge Plan and Services In-house Referral: Clinical Social Work   Post Acute Care Choice: Skilled Nursing Facility Living arrangements for the past 2 months: Single Family Home Expected Discharge Date: 07/16/24                                     Social Drivers of Health (SDOH) Interventions SDOH Screenings   Food Insecurity: No Food Insecurity (07/07/2024)  Housing: Low Risk (07/07/2024)  Transportation Needs: No Transportation Needs (07/07/2024)  Utilities: Not At Risk (07/07/2024)  Social Connections: Socially Isolated (07/07/2024)  Tobacco Use: Low Risk (07/09/2024)    Readmission Risk Interventions     No data to display

## 2024-07-16 NOTE — TOC Progression Note (Signed)
 Transition of Care [facility]) - Progression Note    Patient Details  Name: Gabriel Everett MRN: [id number] Date of Birth: 24-May-1946  [facility] CM/SW Contact  Inocente GORMAN Kindle, LCSW Phone Number: 07/16/2024, 9:04 AM  Clinical Narrative:    [facility] has changed their facility NPI so they had to restart insurance process. They only have one bed today so they will let CSW know when the next opening is once patient is medically stable.    Expected Discharge Plan: Skilled Nursing Facility Barriers to Discharge: Barriers Resolved               Expected Discharge Plan and Services In-house Referral: Clinical Social Work   Post Acute Care Choice: Skilled Nursing Facility Living arrangements for the past 2 months: Single Family Home Expected Discharge Date: 07/14/24                                     Social Drivers of Health (SDOH) Interventions SDOH Screenings   Food Insecurity: No Food Insecurity (07/07/2024)  Housing: Low Risk (07/07/2024)  Transportation Needs: No Transportation Needs (07/07/2024)  Utilities: Not At Risk (07/07/2024)  Social Connections: Socially Isolated (07/07/2024)  Tobacco Use: Low Risk (07/09/2024)    Readmission Risk Interventions     No data to display

## 2024-07-16 NOTE — Progress Notes (Signed)
 Psychiatric Nurse Liaison (PNL) Rounding Note   Patient Mood/Affect: sleepy but pleasant  Noted Patient Behaviors: asleep with head nodded forward; easy to arouse; pt alert and oriented x3; calm, cooperative  Interventions Initiated by Psychiatric Nurse Liaison: pt HOB lowered and pillow placed behind head. Pt appreciative; had fallen asleep with head forward after eating a meal; meal tray removed from table and pt helped to settle in a more comfortable position. Pt asking for pain medication for his neck; this clinical research associate notified pt's RN  Recommendations for Patient Care: no new recommendations  Patients Response to Treatment: effective  Time Spent with Patient:   10 minutes   Loetta Pinal RN, BSN, RN-BC

## 2024-07-16 NOTE — Discharge Summary (Signed)
 "  PATIENT DETAILS Name: Gabriel Everett Age: 79 y.o. Sex: male Date of Birth: 1945/11/27 MRN: [id number]. Admitting Physician: Marsa KATHEE Scurry, MD ERE:Xpmx, Otto, FNP  Admit Date: 07/06/2024 Discharge date: 07/16/2024  Recommendations for Outpatient Follow-up:  Follow up with PCP in 1-2 weeks Please obtain CMP/CBC in one week   Admitted From:  Home  Disposition: Skilled nursing facility   Discharge Condition: good  CODE STATUS:   Code Status: Full Code   Diet recommendation:  Diet Order             DIET DYS 3 Room service appropriate? No; Fluid consistency: Thin  Diet effective now                    Brief Summary: Patient is a 79 y.o.  male with a history of HTN, A-fib, EtOH use-recent history of influenza (1 week prior to admission) found down on the floor by family-found to be confused-multiple skin tears-EMS was called-initial blood pressure in the 70s systolic-improved with IV fluids-patient had a seizure and route to the hospital-upon further evaluation-found to have AKI-mild rhabdomyolysis and subsequently admitted to the hospitalist service.   Significant events: 1/18>> admit to [facility] 1/26>> discharged to SNF-however found to have orthostatic hypotension-hence discharge held.  More delirium overnight 1/27 >> started on Seroquel . 1/28>> no major issues overnight-sleeping comfortably.   Significant studies: 1/18>> chest x-ray: No PNA 1/18>> CT chest/abdomen/pelvis: No evidence of infection in the chest/abdomen or pelvis. 1/18>> MRI brain:No acute intracranial abnormality. 1/18>> EEG: No seizures. 1/20>> RUQ ultrasound: Hepatic steatosis-6 mm gallbladder polyp.   Significant microbiology data: 1/18>> COVID/influenza/RSV PCR: Negative 1/18>> blood culture: No growth 1/20>> respiratory virus panel: Negative   Procedures: None   Consults: None  Brief Hospital Course: Acute metabolic encephalopathy Felt to be secondary to AKI/EtOH  withdrawal Overall improved-however further hospital course complicated by hospital delirium-started on Seroquel  with no major issues overnight.   Hypotension Initially-per EMS-hypotensive in the field-likely secondary to dehydration Overall blood pressure much better but now has orthostatic hypotension   Orthostatic hypotension Managed with midodrine  Ensure compression stockings at SNF Follow closely-a.m. cortisol stable.   AKI Likely hemodynamically mediated Resolved with IV fluids   Rhabdomyolysis Secondary to being down on the floor for several hours Resolved with IVF.   Asymptomatic bacteriuria versus UTI Cultures never sent-UA equivocal Already treated with Rocephin  x 5 days.   SIRS Likely noninfectious-probably hypothermia was due to environmental exposure-being down on the floor for several hours Supportive care.   ?  Coffee-ground emesis This was noted by family-during this hospitalization-there was no evidence of coffee-ground emesis/melena or any evidence of GI bleeding-Hb has been stable for the past several days (slightly worse than on initial presentation due to IVF dilutions and critical illness) Continue PPI/Carafate  Follow-up in GI postdischarge.   Paroxysmal atrial fibrillation Continue telemetry monitoring On beta-blocker Previously/prior to hospitalization-not on anticoagulation-probably a good idea to keep him off anticoagulation given history of EtOH use/falls.   Pressure Ulcer: Agree with assessment as outlined below. Wound 07/07/24 1250 Pressure Injury Buttocks Deep Tissue Pressure Injury - Purple or maroon localized area of discolored intact skin or blood-filled blister due to damage of underlying soft tissue from pressure and/or shear. (Active)   Discharge Diagnoses:  Principal Problem:   Acute encephalopathy Active Problems:   SIRS (systemic inflammatory response syndrome) (HCC)   Essential hypertension   Alcohol  use   PAF (paroxysmal atrial  fibrillation) (HCC)   Seizure-like activity (HCC)  AKI (acute kidney injury)   High anion gap metabolic acidosis   Discharge Instructions:  Activity:  As tolerated with Full fall precautions use walker/cane & assistance as needed   Discharge Instructions     Call MD for:  extreme fatigue   Complete by: As directed    Call MD for:  persistant dizziness or light-headedness   Complete by: As directed    Discharge instructions   Complete by: As directed    Follow with Primary MD  [name], FNP in 1-2 weeks  Please get a complete blood count and chemistry panel checked by your Primary MD at your next visit, and again as instructed by your Primary MD.  Get Medicines reviewed and adjusted: Please take all your medications with you for your next visit with your Primary MD  Laboratory/radiological data: Please request your Primary MD to go over all hospital tests and procedure/radiological results at the follow up, please ask your Primary MD to get all Hospital records sent to his/her office.  In some cases, they will be blood work, cultures and biopsy results pending at the time of your discharge. Please request that your primary care M.D. follows up on these results.  Also Note the following: If you experience worsening of your admission symptoms, develop shortness of breath, life threatening emergency, suicidal or homicidal thoughts you must seek medical attention immediately by calling 911 or calling your MD immediately  if symptoms less severe.  You must read complete instructions/literature along with all the possible adverse reactions/side effects for all the Medicines you take and that have been prescribed to you. Take any new Medicines after you have completely understood and accpet all the possible adverse reactions/side effects.   Do not drive when taking Pain medications or sleeping medications (Benzodaizepines)  Do not take more than prescribed Pain, Sleep and Anxiety  Medications. It is not advisable to combine anxiety,sleep and pain medications without talking with your primary care practitioner  Special Instructions: If you have smoked or chewed Tobacco  in the last 2 yrs please stop smoking, stop any regular Alcohol   and or any Recreational drug use.  Wear Seat belts while driving.  Please note: You were cared for by a hospitalist during your hospital stay. Once you are discharged, your primary care physician will handle any further medical issues. Please note that NO REFILLS for any discharge medications will be authorized once you are discharged, as it is imperative that you return to your primary care physician (or establish a relationship with a primary care physician if you do not have one) for your post hospital discharge needs so that they can reassess your need for medications and monitor your lab values.   Increase activity slowly   Complete by: As directed    Increase activity slowly   Complete by: As directed    No wound care   Complete by: As directed    No wound care   Complete by: As directed       Allergies as of 07/16/2024       Reactions   Cardizem  [diltiazem ] Swelling   Swelling in lower legs         Medication List     STOP taking these medications    cephALEXin 500 MG capsule Commonly known as: KEFLEX   ibuprofen  400 MG tablet Commonly known as: ADVIL    ondansetron  4 MG disintegrating tablet Commonly known as: ZOFRAN -ODT   propranolol  40 MG tablet Commonly known as: INDERAL   TAKE these medications    acetaminophen  325 MG tablet Commonly known as: TYLENOL  Take 2 tablets (650 mg total) by mouth every 6 (six) hours as needed for mild pain (pain score 1-3) or fever (or Fever >/= 101).   cyanocobalamin  1000 MCG tablet Take 1 tablet (1,000 mcg total) by mouth daily.   feeding supplement Liqd Take 237 mLs by mouth 2 (two) times daily between meals.   folic acid  1 MG tablet Commonly known as:  FOLVITE  Take 1 tablet (1 mg total) by mouth daily.   Metoprolol  Tartrate 37.5 MG Tabs Take 1 tablet (37.5 mg total) by mouth 2 (two) times daily.   midodrine  2.5 MG tablet Commonly known as: PROAMATINE  Take 3 tablets (7.5 mg total) by mouth 3 (three) times daily with meals.   multivitamin with minerals Tabs tablet Take 1 tablet by mouth daily.   pantoprazole  40 MG tablet Commonly known as: Protonix  Take 1 tablet (40 mg total) by mouth 2 (two) times daily before a meal.   polyethylene glycol 17 g packet Commonly known as: MIRALAX  / GLYCOLAX  Take 17 g by mouth daily as needed for mild constipation.   QUEtiapine  25 MG tablet Commonly known as: SEROQUEL  Take 1 tablet (25 mg total) by mouth 2 (two) times daily.   sucralfate  1 GM/10ML suspension Commonly known as: CARAFATE  Take 10 mLs (1 g total) by mouth 4 (four) times daily -  with meals and at bedtime.   thiamine  100 MG tablet Commonly known as: VITAMIN B1 Take 1 tablet (100 mg total) by mouth daily.        Contact information for follow-up providers     [name], FNP. Schedule an appointment as soon as possible for a visit in 1 week(s).   Specialty: Pain Medicine Contact information: [address] [place] [address] [phone]              Contact information for after-discharge care     Destination     [facility] .   Service: Skilled Nursing Contact information: [address] [place]  [address] [phone]                    Allergies[1]   Other Procedures/Studies: US  Abdomen Limited RUQ (LIVER/GB) Result Date: 07/08/2024 CLINICAL DATA:  Transaminitis EXAM: ULTRASOUND ABDOMEN LIMITED RIGHT UPPER QUADRANT COMPARISON:  July 06, 2024.  November 23, 2023. FINDINGS: Gallbladder: No gallstones or wall thickening visualized. No sonographic Murphy sign noted by sonographer. 6 mm polyp is noted. Sludge is noted. Common bile duct: Diameter: 4 mm which is  within normal limits. Liver: No focal lesion identified. Increased echogenicity of hepatic parenchyma is noted suggesting hepatic steatosis. Portal vein is patent on color Doppler imaging with normal direction of blood flow towards the liver. Other: None. IMPRESSION: 1. Hepatic steatosis. 2. 6 mm gallbladder polyp is noted. Follow-up ultrasound in 1 year is recommended to ensure stability. 3. Sludge is noted within gallbladder lumen. No cholelithiasis or cholecystitis is noted. Electronically Signed   By: Lynwood Landy Raddle M.D.   On: 07/08/2024 15:25   DG Chest Port 1 View Result Date: 07/08/2024 CLINICAL DATA:  Shortness of breath. EXAM: PORTABLE CHEST 1 VIEW COMPARISON:  07/06/2024 FINDINGS: The lungs are clear without focal pneumonia, edema, pneumothorax or pleural effusion. Cardiopericardial silhouette is at upper limits of normal for size. No acute bony abnormality. Telemetry leads overlie the chest. IMPRESSION: No active disease. Electronically Signed   By: Camellia Candle  M.D.   On: 07/08/2024 06:15   EEG adult Result Date: 07/07/2024 Shelton Arlin KIDD, MD     07/07/2024  4:14 PM Patient Name: JHONNY CALIXTO MRN: [id number] Epilepsy Attending: Arlin KIDD Shelton Referring Physician/Provider: Seena Marsa NOVAK, MD Date: 07/07/2024 Duration: 23 mins Patient history: 79yo M with ams. EEG to evaluate for seizure Level of alertness: Awake, asleep AEDs during EEG study: None Technical aspects: This EEG study was done with scalp electrodes positioned according to the 10-20 International system of electrode placement. Electrical activity was reviewed with band pass filter of 1-70Hz , sensitivity of 7 uV/mm, display speed of 75mm/sec with a 60Hz  notched filter applied as appropriate. EEG data were recorded continuously and digitally stored.  Video monitoring was available and reviewed as appropriate. Description: The posterior dominant rhythm consists of 8 Hz activity of moderate voltage (25-35 uV) seen predominantly  in posterior head regions, symmetric and reactive to eye opening and eye closing. Sleep was characterized by vertex waves, sleep spindles (12 to 14 Hz), maximal frontocentral region. Hyperventilation and photic stimulation were not performed.   IMPRESSION: This study is within normal limits. No seizures or epileptiform discharges were seen throughout the recording. A normal interictal EEG does not exclude the diagnosis of epilepsy. Arlin KIDD Shelton   MR BRAIN WO CONTRAST Result Date: 07/06/2024 EXAM: MRI BRAIN WITHOUT CONTRAST 07/06/2024 10:15:01 PM TECHNIQUE: Multiplanar multisequence MRI of the head/brain was performed without the administration of intravenous contrast. COMPARISON: None available. CLINICAL HISTORY: Seizure, new-onset, no history of trauma. FINDINGS: BRAIN AND VENTRICLES: No acute infarct. No intracranial hemorrhage. No mass. No midline shift. No hydrocephalus. Mild multifocal hyperintense T2-weighted signal within the cerebral white matter, most commonly due to chronic small vessel disease. Mild volume loss not greater than expected for age. The hippocampi are normal and symmetric in size and signal. The hypothalamus and mammillary bodies are normal. There is no cortical ectopia or dysplasia. The sella is unremarkable. Normal flow voids. ORBITS: No significant abnormality. SINUSES AND MASTOIDS: No significant abnormality. BONES AND SOFT TISSUES: Normal marrow signal. No soft tissue abnormality. IMPRESSION: 1. No acute intracranial abnormality. 2. No epileptogenic lesion identified. Electronically signed by: Franky Stanford MD 07/06/2024 10:19 PM EST RP Workstation: [id number]   CT CHEST ABDOMEN PELVIS WO CONTRAST Result Date: 07/06/2024 EXAM: CT CHEST, ABDOMEN AND PELVIS WITHOUT CONTRAST 07/06/2024 02:31:11 PM TECHNIQUE: CT of the chest, abdomen and pelvis was performed without the administration of intravenous contrast. Multiplanar reformatted images are provided for review. Automated exposure  control, iterative reconstruction, and/or weight based adjustment of the mA/kV was utilized to reduce the radiation dose to as low as reasonably achievable. COMPARISON: CT chest 09/07/2022. CLINICAL HISTORY: Sepsis. FINDINGS: CHEST: MEDIASTINUM AND LYMPH NODES: Heart and pericardium are unremarkable. The central airways are clear. No mediastinal, hilar or axillary lymphadenopathy. LUNGS AND PLEURA: No focal consolidation or pulmonary edema. No pleural effusion. No pneumothorax. ABDOMEN AND PELVIS: LIVER: Attenuation in the liver consistent with hepatic steatosis. GALLBLADDER AND BILE DUCTS: Unremarkable. No biliary ductal dilatation. SPLEEN: No acute abnormality. PANCREAS: No acute abnormality. ADRENAL GLANDS: No acute abnormality. KIDNEYS, URETERS AND BLADDER: No stones in the kidneys or ureters. No hydronephrosis. No perinephric or periureteral stranding. Urinary bladder is unremarkable. GI AND BOWEL: Stomach demonstrates no acute abnormality. There is no bowel obstruction. REPRODUCTIVE ORGANS: No acute abnormality. PERITONEUM AND RETROPERITONEUM: No ascites. No free air. VASCULATURE: Aorta is normal in caliber. ABDOMINAL AND PELVIS LYMPH NODES: No lymphadenopathy. REPRODUCTIVE ORGANS: No acute abnormality. BONES AND SOFT TISSUES: No  acute osseous abnormality. No focal soft tissue abnormality. OVERALL ASSESSMENT: No evidence of infection in the chest, abdomen, or pelvis. No acute findings. IMPRESSION: 1. No evidence of infection in the chest, abdomen, or pelvis. 2. No acute findings. 3. Hepatic steatosis. Electronically signed by: Norleen Boxer MD 07/06/2024 03:30 PM EST RP Workstation: [id number]   CT Head Wo Contrast Result Date: 07/06/2024 EXAM: CT HEAD WITHOUT CONTRAST 07/06/2024 02:31:11 PM TECHNIQUE: CT of the head was performed without the administration of intravenous contrast. Automated exposure control, iterative reconstruction, and/or weight based adjustment of the mA/kV was utilized to reduce the  radiation dose to as low as reasonably achievable. COMPARISON: 11/22/2023 CLINICAL HISTORY: Mental status change, unknown cause. FINDINGS: BRAIN AND VENTRICLES: No acute hemorrhage. No evidence of acute infarct. No hydrocephalus. No extra-axial collection. No mass effect or midline shift. Generalized cerebral volume loss and chronic small vessel ischemic disease changes are stable. Intracranial arterial calcification. ORBITS: No acute abnormality. SINUSES: Mild mucosal thickening in paranasal sinuses. Chronic rightward nasal septal deviation. SOFT TISSUES AND SKULL: No acute soft tissue abnormality. No skull fracture. IMPRESSION: 1. No acute intracranial abnormality. 2. Stable generalized cerebral volume loss and chronic small vessel ischemic disease changes. Electronically signed by: Lonni Necessary MD 07/06/2024 03:05 PM EST RP Workstation: [id number]   DG Chest Port 1 View Result Date: 07/06/2024 EXAM: 1 VIEW(S) XRAY OF THE CHEST 07/06/2024 12:09:00 PM COMPARISON: 02/16/2018 CLINICAL HISTORY: Questionable sepsis - evaluate for abnormality FINDINGS: LUNGS AND PLEURA: No focal pulmonary opacity. No pleural effusion. No pneumothorax. HEART AND MEDIASTINUM: Atherosclerotic calcifications. No acute abnormality of the cardiac and mediastinal silhouettes. BONES AND SOFT TISSUES: No acute osseous abnormality. IMPRESSION: 1. No acute findings. Electronically signed by: Waddell Calk MD 07/06/2024 12:58 PM EST RP Workstation: [id number]     TODAY-DAY OF DISCHARGE:  Subjective:   Sim Pouch today has no headache,no chest abdominal pain,no new weakness tingling or numbness, feels much better wants to go home today.  Objective:   Blood pressure (!) 92/37, pulse (!) 58, temperature 97.7 F (36.5 C), temperature source Axillary, resp. rate 18, height 5' 10 (1.778 m), weight 70.3 kg, SpO2 98%. No intake or output data in the 24 hours ending 07/16/24 0924 Filed Weights   07/06/24 1136  Weight: 70.3  kg    Exam: Awake Alert, Oriented *3, No new F.N deficits, Normal affect Iva.AT,PERRAL Supple Neck,No JVD, No cervical lymphadenopathy appriciated.  Symmetrical Chest wall movement, Good air movement bilaterally, CTAB RRR,No Gallops,Rubs or new Murmurs, No Parasternal Heave +ve B.Sounds, Abd Soft, Non tender, No organomegaly appriciated, No rebound -guarding or rigidity. No Cyanosis, Clubbing or edema, No new Rash or bruise   PERTINENT RADIOLOGIC STUDIES: No results found.   PERTINENT LAB RESULTS: CBC: Recent Labs    07/15/24 0500 07/16/24 0348  WBC 8.7 7.1  HGB 9.9* 8.9*  HCT 28.2* 26.3*  PLT 253 251   CMET CMP     Component Value Date/Time   NA 140 07/16/2024 0348   K 3.9 07/16/2024 0348   CL 111 07/16/2024 0348   CO2 16 (L) 07/16/2024 0348   GLUCOSE 71 07/16/2024 0348   BUN 13 07/16/2024 0348   CREATININE 0.85 07/16/2024 0348   CALCIUM 7.8 (L) 07/16/2024 0348   PROT 4.5 (L) 07/09/2024 0334   ALBUMIN 2.7 (L) 07/09/2024 0334   AST 64 (H) 07/09/2024 0334   ALT 45 (H) 07/09/2024 0334   ALKPHOS 54 07/09/2024 0334   BILITOT 1.3 (H) 07/09/2024 0334   GFR 73.47 07/14/2013  1200   EGFR 91.0 12/11/2023 0706   GFRNONAA >60 07/16/2024 0348    GFR Estimated Creatinine Clearance: 68.9 mL/min (by C-G formula based on SCr of 0.85 mg/dL). No results for input(s): LIPASE, AMYLASE in the last 72 hours. No results for input(s): CKTOTAL, CKMB, CKMBINDEX, TROPONINI in the last 72 hours. Invalid input(s): POCBNP No results for input(s): DDIMER in the last 72 hours. No results for input(s): HGBA1C in the last 72 hours. No results for input(s): CHOL, HDL, LDLCALC, TRIG, CHOLHDL, LDLDIRECT in the last 72 hours. No results for input(s): TSH, T4TOTAL, T3FREE, THYROIDAB in the last 72 hours.  Invalid input(s): FREET3 No results for input(s): VITAMINB12, FOLATE, FERRITIN, TIBC, IRON, RETICCTPCT in the last 72 hours. Coags: No  results for input(s): INR in the last 72 hours.  Invalid input(s): PT Microbiology: Recent Results (from the past 240 hours)  Resp panel by RT-PCR (RSV, Flu A&B, Covid) Anterior Nasal Swab     Status: None   Collection Time: 07/06/24 11:46 AM   Specimen: Anterior Nasal Swab  Result Value Ref Range Status   SARS Coronavirus 2 by RT PCR NEGATIVE NEGATIVE Final   Influenza A by PCR NEGATIVE NEGATIVE Final   Influenza B by PCR NEGATIVE NEGATIVE Final    Comment: (NOTE) The Xpert Xpress SARS-CoV-2/FLU/RSV plus assay is intended as an aid in the diagnosis of influenza from Nasopharyngeal swab specimens and should not be used as a sole basis for treatment. Nasal washings and aspirates are unacceptable for Xpert Xpress SARS-CoV-2/FLU/RSV testing.  Fact Sheet for Patients: bloggercourse.com  Fact Sheet for Healthcare Providers: seriousbroker.it  This test is not yet approved or cleared by the [place]  FDA and has been authorized for detection and/or diagnosis of SARS-CoV-2 by FDA under an Emergency Use Authorization (EUA). This EUA will remain in effect (meaning this test can be used) for the duration of the COVID-19 declaration under Section 564(b)(1) of the Act, 21 U.S.C. section 360bbb-3(b)(1), unless the authorization is terminated or revoked.     Resp Syncytial Virus by PCR NEGATIVE NEGATIVE Final    Comment: (NOTE) Fact Sheet for Patients: bloggercourse.com  Fact Sheet for Healthcare Providers: seriousbroker.it  This test is not yet approved or cleared by the [place]  FDA and has been authorized for detection and/or diagnosis of SARS-CoV-2 by FDA under an Emergency Use Authorization (EUA). This EUA will remain in effect (meaning this test can be used) for the duration of the COVID-19 declaration under Section 564(b)(1) of the Act, 21 U.S.C. section  360bbb-3(b)(1), unless the authorization is terminated or revoked.  Performed at [facility], [address], [place], [place] [address]   Blood Culture (routine x 2)     Status: None   Collection Time: 07/06/24 11:51 AM   Specimen: BLOOD RIGHT ARM  Result Value Ref Range Status   Specimen Description BLOOD RIGHT ARM  Final   Special Requests   Final    BOTTLES DRAWN AEROBIC AND ANAEROBIC Blood Culture adequate volume   Culture   Final    NO GROWTH 5 DAYS Performed at [facility], [address], [place], [place] [address]    Report Status 07/11/2024 FINAL  Final  Blood Culture (routine x 2)     Status: None   Collection Time: 07/06/24 12:18 PM   Specimen: BLOOD  Result Value Ref Range Status   Specimen Description BLOOD  Final   Special Requests NONE  Final   Culture   Final  NO GROWTH 5 DAYS Performed at [facility], [address], [place], [place] [address]    Report Status 07/11/2024 FINAL  Final  MRSA Next Gen by PCR, Nasal     Status: None   Collection Time: 07/07/24  5:27 PM   Specimen: Nasal Mucosa; Nasal Swab  Result Value Ref Range Status   MRSA by PCR Next Gen NOT DETECTED NOT DETECTED Final    Comment: (NOTE) The GeneXpert MRSA Assay (FDA approved for NASAL specimens only), is one component of a comprehensive MRSA colonization surveillance program. It is not intended to diagnose MRSA infection nor to guide or monitor treatment for MRSA infections. Test performance is not FDA approved in patients less than 5 years old. Performed at [facility], [address], [place], [place] [address]   Respiratory (~20 pathogens) panel by PCR     Status: None   Collection Time: 07/08/24 10:13 AM   Specimen: Nasopharyngeal Swab; Respiratory  Result Value Ref Range Status   Adenovirus NOT DETECTED NOT DETECTED Final   Coronavirus 229E NOT DETECTED NOT DETECTED Final    Comment: (NOTE) The Coronavirus on the Respiratory Panel, DOES NOT test  for the novel  Coronavirus (2019 nCoV)    Coronavirus HKU1 NOT DETECTED NOT DETECTED Final   Coronavirus NL63 NOT DETECTED NOT DETECTED Final   Coronavirus OC43 NOT DETECTED NOT DETECTED Final   Metapneumovirus NOT DETECTED NOT DETECTED Final   Rhinovirus / Enterovirus NOT DETECTED NOT DETECTED Final   Influenza A NOT DETECTED NOT DETECTED Final   Influenza B NOT DETECTED NOT DETECTED Final   Parainfluenza Virus 1 NOT DETECTED NOT DETECTED Final   Parainfluenza Virus 2 NOT DETECTED NOT DETECTED Final   Parainfluenza Virus 3 NOT DETECTED NOT DETECTED Final   Parainfluenza Virus 4 NOT DETECTED NOT DETECTED Final   Respiratory Syncytial Virus NOT DETECTED NOT DETECTED Final   Bordetella pertussis NOT DETECTED NOT DETECTED Final   Bordetella Parapertussis NOT DETECTED NOT DETECTED Final   Chlamydophila pneumoniae NOT DETECTED NOT DETECTED Final   Mycoplasma pneumoniae NOT DETECTED NOT DETECTED Final    Comment: Performed at [facility], [address], [place], [place] [id number]    FURTHER DISCHARGE INSTRUCTIONS:  Get Medicines reviewed and adjusted: Please take all your medications with you for your next visit with your Primary MD  Laboratory/radiological data: Please request your Primary MD to go over all hospital tests and procedure/radiological results at the follow up, please ask your Primary MD to get all Hospital records sent to his/her office.  In some cases, they will be blood work, cultures and biopsy results pending at the time of your discharge. Please request that your primary care M.D. goes through all the records of your hospital data and follows up on these results.  Also Note the following: If you experience worsening of your admission symptoms, develop shortness of breath, life threatening emergency, suicidal or homicidal thoughts you must seek medical attention immediately by calling 911 or calling your MD immediately  if symptoms less severe.  You must read  complete instructions/literature along with all the possible adverse reactions/side effects for all the Medicines you take and that have been prescribed to you. Take any new Medicines after you have completely understood and accpet all the possible adverse reactions/side effects.   Do not drive when taking Pain medications or sleeping medications (Benzodaizepines)  Do not take more than prescribed Pain, Sleep and Anxiety Medications. It is not advisable  to combine anxiety,sleep and pain medications without talking with your primary care practitioner  Special Instructions: If you have smoked or chewed Tobacco  in the last 2 yrs please stop smoking, stop any regular Alcohol   and or any Recreational drug use.  Wear Seat belts while driving.  Please note: You were cared for by a hospitalist during your hospital stay. Once you are discharged, your primary care physician will handle any further medical issues. Please note that NO REFILLS for any discharge medications will be authorized once you are discharged, as it is imperative that you return to your primary care physician (or establish a relationship with a primary care physician if you do not have one) for your post hospital discharge needs so that they can reassess your need for medications and monitor your lab values.  Total Time spent coordinating discharge including counseling, education and face to face time equals greater than 30 minutes.  Signed: Donalda Applebaum 07/16/2024 9:24 AM      [1]  Allergies Allergen Reactions   Cardizem  [Diltiazem ] Swelling    Swelling in lower legs    "
